# Patient Record
Sex: Male | Born: 1960 | Race: White | Hispanic: No | Marital: Married | Smoking: Never smoker
Health system: Southern US, Community
[De-identification: ages and names within clinical notes are randomized; demographics above are authoritative.]

## PROBLEM LIST (undated history)

## (undated) DIAGNOSIS — M1990 Unspecified osteoarthritis, unspecified site: Secondary | ICD-10-CM

## (undated) DIAGNOSIS — K649 Unspecified hemorrhoids: Secondary | ICD-10-CM

## (undated) DIAGNOSIS — R740 Nonspecific elevation of levels of transaminase and lactic acid dehydrogenase [LDH]: Secondary | ICD-10-CM

## (undated) DIAGNOSIS — R7401 Elevation of levels of liver transaminase levels: Secondary | ICD-10-CM

## (undated) DIAGNOSIS — T7840XA Allergy, unspecified, initial encounter: Secondary | ICD-10-CM

## (undated) DIAGNOSIS — U071 COVID-19: Secondary | ICD-10-CM

## (undated) DIAGNOSIS — Z86010 Personal history of colonic polyps: Secondary | ICD-10-CM

## (undated) DIAGNOSIS — E782 Mixed hyperlipidemia: Secondary | ICD-10-CM

## (undated) DIAGNOSIS — K219 Gastro-esophageal reflux disease without esophagitis: Secondary | ICD-10-CM

## (undated) DIAGNOSIS — I10 Essential (primary) hypertension: Secondary | ICD-10-CM

## (undated) DIAGNOSIS — R7402 Elevation of levels of lactic acid dehydrogenase (LDH): Secondary | ICD-10-CM

## (undated) DIAGNOSIS — F419 Anxiety disorder, unspecified: Secondary | ICD-10-CM

## (undated) HISTORY — DX: Essential (primary) hypertension: I10

## (undated) HISTORY — DX: Personal history of colonic polyps: Z86.010

## (undated) HISTORY — DX: COVID-19: U07.1

## (undated) HISTORY — DX: Elevation of levels of lactic acid dehydrogenase (LDH): R74.02

## (undated) HISTORY — DX: Anxiety disorder, unspecified: F41.9

## (undated) HISTORY — PX: POLYPECTOMY: SHX149

## (undated) HISTORY — PX: COLONOSCOPY: SHX174

## (undated) HISTORY — DX: Gastro-esophageal reflux disease without esophagitis: K21.9

## (undated) HISTORY — DX: Elevation of levels of liver transaminase levels: R74.01

## (undated) HISTORY — DX: Mixed hyperlipidemia: E78.2

## (undated) HISTORY — DX: Unspecified osteoarthritis, unspecified site: M19.90

## (undated) HISTORY — DX: Allergy, unspecified, initial encounter: T78.40XA

## (undated) HISTORY — DX: Unspecified hemorrhoids: K64.9

## (undated) HISTORY — DX: Nonspecific elevation of levels of transaminase and lactic acid dehydrogenase (ldh): R74.0

---
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
## 2005-08-15 ENCOUNTER — Ambulatory Visit: Payer: Self-pay | Admitting: Internal Medicine

## 2005-08-19 ENCOUNTER — Ambulatory Visit: Payer: Self-pay

## 2005-10-03 ENCOUNTER — Ambulatory Visit: Payer: Self-pay | Admitting: Internal Medicine

## 2005-11-23 ENCOUNTER — Ambulatory Visit: Payer: Self-pay | Admitting: Internal Medicine

## 2006-01-03 ENCOUNTER — Ambulatory Visit: Payer: Self-pay | Admitting: Internal Medicine

## 2006-01-10 ENCOUNTER — Ambulatory Visit: Payer: Self-pay | Admitting: Internal Medicine

## 2006-02-14 ENCOUNTER — Ambulatory Visit: Payer: Self-pay | Admitting: Internal Medicine

## 2006-02-21 ENCOUNTER — Ambulatory Visit: Payer: Self-pay | Admitting: Internal Medicine

## 2006-03-28 ENCOUNTER — Ambulatory Visit: Payer: Self-pay | Admitting: Internal Medicine

## 2006-04-04 ENCOUNTER — Ambulatory Visit: Payer: Self-pay | Admitting: Internal Medicine

## 2006-05-10 ENCOUNTER — Ambulatory Visit: Payer: Self-pay | Admitting: Internal Medicine

## 2006-05-16 ENCOUNTER — Ambulatory Visit: Payer: Self-pay | Admitting: Internal Medicine

## 2006-11-28 LAB — HM COLONOSCOPY

## 2007-08-21 ENCOUNTER — Ambulatory Visit: Payer: Self-pay | Admitting: Internal Medicine

## 2007-08-27 ENCOUNTER — Encounter: Payer: Self-pay | Admitting: Internal Medicine

## 2007-08-28 ENCOUNTER — Encounter: Payer: Self-pay | Admitting: Internal Medicine

## 2007-08-28 ENCOUNTER — Ambulatory Visit: Payer: Self-pay | Admitting: Internal Medicine

## 2007-08-28 ENCOUNTER — Ambulatory Visit (HOSPITAL_COMMUNITY): Admission: RE | Admit: 2007-08-28 | Discharge: 2007-08-28 | Payer: Self-pay | Admitting: Internal Medicine

## 2007-09-03 ENCOUNTER — Ambulatory Visit (HOSPITAL_COMMUNITY): Admission: RE | Admit: 2007-09-03 | Discharge: 2007-09-03 | Payer: Self-pay | Admitting: Internal Medicine

## 2007-09-20 ENCOUNTER — Ambulatory Visit: Payer: Self-pay | Admitting: Internal Medicine

## 2007-09-20 ENCOUNTER — Encounter: Payer: Self-pay | Admitting: Internal Medicine

## 2007-12-17 ENCOUNTER — Ambulatory Visit: Payer: Self-pay | Admitting: Internal Medicine

## 2007-12-17 DIAGNOSIS — I10 Essential (primary) hypertension: Secondary | ICD-10-CM | POA: Insufficient documentation

## 2007-12-17 DIAGNOSIS — K219 Gastro-esophageal reflux disease without esophagitis: Secondary | ICD-10-CM | POA: Insufficient documentation

## 2007-12-17 DIAGNOSIS — E1169 Type 2 diabetes mellitus with other specified complication: Secondary | ICD-10-CM | POA: Insufficient documentation

## 2007-12-17 DIAGNOSIS — I152 Hypertension secondary to endocrine disorders: Secondary | ICD-10-CM | POA: Insufficient documentation

## 2007-12-17 DIAGNOSIS — E782 Mixed hyperlipidemia: Secondary | ICD-10-CM

## 2007-12-20 ENCOUNTER — Telehealth: Payer: Self-pay | Admitting: Internal Medicine

## 2007-12-20 LAB — CONVERTED CEMR LAB
AST: 39 units/L — ABNORMAL HIGH (ref 0–37)
Albumin: 4.3 g/dL (ref 3.5–5.2)
Alkaline Phosphatase: 71 units/L (ref 39–117)
BUN: 16 mg/dL (ref 6–23)
CO2: 30 meq/L (ref 19–32)
Creatinine, Ser: 0.9 mg/dL (ref 0.4–1.5)
GFR calc Af Amer: 116 mL/min
GFR calc non Af Amer: 96 mL/min
Glucose, Bld: 78 mg/dL (ref 70–99)
HDL: 32.4 mg/dL — ABNORMAL LOW (ref >39.0)
Total CHOL/HDL Ratio: 6.9
Triglycerides: 222 mg/dL (ref 0–149)

## 2007-12-21 ENCOUNTER — Telehealth: Payer: Self-pay | Admitting: Internal Medicine

## 2008-03-18 ENCOUNTER — Ambulatory Visit: Payer: Self-pay | Admitting: Internal Medicine

## 2008-03-20 ENCOUNTER — Ambulatory Visit: Payer: Self-pay | Admitting: Internal Medicine

## 2008-07-25 ENCOUNTER — Ambulatory Visit: Payer: Self-pay | Admitting: Internal Medicine

## 2008-07-25 LAB — CONVERTED CEMR LAB
ALT: 39 units/L (ref 0–53)
Albumin: 4.4 g/dL (ref 3.5–5.2)
BUN: 17 mg/dL (ref 6–23)
Bilirubin, Direct: 0.1 mg/dL (ref 0.0–0.3)
CO2: 31 meq/L (ref 19–32)
Chloride: 103 meq/L (ref 96–112)
GFR calc Af Amer: 132 mL/min
GFR calc non Af Amer: 109 mL/min
Glucose, Bld: 94 mg/dL (ref 70–99)
Sodium: 142 meq/L (ref 135–145)
Total Bilirubin: 1 mg/dL (ref 0.3–1.2)
Total Protein: 8.1 g/dL (ref 6.0–8.3)
VLDL: 51 mg/dL — ABNORMAL HIGH (ref 0–40)

## 2008-08-01 ENCOUNTER — Ambulatory Visit: Payer: Self-pay | Admitting: Internal Medicine

## 2008-12-17 ENCOUNTER — Ambulatory Visit: Payer: Self-pay | Admitting: Internal Medicine

## 2008-12-17 LAB — CONVERTED CEMR LAB
Albumin: 4.4 g/dL (ref 3.5–5.2)
Alkaline Phosphatase: 77 units/L (ref 39–117)
Cholesterol: 254 mg/dL (ref 0–200)
Direct LDL: 163.9 mg/dL
Total Bilirubin: 1.2 mg/dL (ref 0.3–1.2)
Total CHOL/HDL Ratio: 8.1
VLDL: 65 mg/dL — ABNORMAL HIGH (ref 0–40)

## 2008-12-25 ENCOUNTER — Ambulatory Visit: Payer: Self-pay | Admitting: Internal Medicine

## 2009-04-16 ENCOUNTER — Ambulatory Visit: Payer: Self-pay | Admitting: Internal Medicine

## 2009-04-17 LAB — CONVERTED CEMR LAB
AST: 70 units/L — ABNORMAL HIGH (ref 0–37)
Albumin: 4.4 g/dL (ref 3.5–5.2)
Cholesterol: 206 mg/dL — ABNORMAL HIGH (ref 0–200)
Direct LDL: 136.2 mg/dL
Total Bilirubin: 0.9 mg/dL (ref 0.3–1.2)
Total CHOL/HDL Ratio: 6

## 2009-04-29 ENCOUNTER — Ambulatory Visit: Payer: Self-pay | Admitting: Internal Medicine

## 2009-05-04 ENCOUNTER — Encounter: Admission: RE | Admit: 2009-05-04 | Discharge: 2009-05-04 | Payer: Self-pay | Admitting: Internal Medicine

## 2009-05-20 ENCOUNTER — Ambulatory Visit: Payer: Self-pay | Admitting: Internal Medicine

## 2009-06-24 ENCOUNTER — Ambulatory Visit: Payer: Self-pay | Admitting: Internal Medicine

## 2009-06-24 LAB — CONVERTED CEMR LAB
ALT: 50 units/L (ref 0–53)
AST: 37 units/L (ref 0–37)
Alkaline Phosphatase: 74 units/L (ref 39–117)
Bilirubin, Direct: 0.1 mg/dL (ref 0.0–0.3)
Cholesterol: 198 mg/dL (ref 0–200)
HDL: 31.9 mg/dL — ABNORMAL LOW (ref >39.00)
Total Bilirubin: 0.9 mg/dL (ref 0.3–1.2)
Total Protein: 7.5 g/dL (ref 6.0–8.3)
Triglycerides: 454 mg/dL — ABNORMAL HIGH (ref 0.0–149.0)
VLDL: 90.8 mg/dL — ABNORMAL HIGH (ref 0.0–40.0)

## 2009-06-26 ENCOUNTER — Ambulatory Visit: Payer: Self-pay | Admitting: Internal Medicine

## 2009-12-10 ENCOUNTER — Ambulatory Visit: Payer: Self-pay | Admitting: Internal Medicine

## 2009-12-10 LAB — CONVERTED CEMR LAB
ALT: 93 units/L — ABNORMAL HIGH (ref 0–53)
AST: 74 units/L — ABNORMAL HIGH (ref 0–37)
BUN: 12 mg/dL (ref 6–23)
CO2: 27 meq/L (ref 19–32)
Calcium: 9.1 mg/dL (ref 8.4–10.5)
GFR calc non Af Amer: 108.44 mL/min (ref >60)
HDL: 31.7 mg/dL — ABNORMAL LOW (ref >39.00)
Total Bilirubin: 0.8 mg/dL (ref 0.3–1.2)
Total Protein: 7.9 g/dL (ref 6.0–8.3)
Triglycerides: 847 mg/dL — ABNORMAL HIGH (ref 0.0–149.0)

## 2009-12-22 ENCOUNTER — Ambulatory Visit: Payer: Self-pay | Admitting: Internal Medicine

## 2010-03-18 ENCOUNTER — Observation Stay (HOSPITAL_COMMUNITY): Admission: EM | Admit: 2010-03-18 | Discharge: 2010-03-19 | Payer: Self-pay | Admitting: Emergency Medicine

## 2010-03-23 ENCOUNTER — Ambulatory Visit: Payer: Self-pay | Admitting: Internal Medicine

## 2010-04-06 ENCOUNTER — Ambulatory Visit: Payer: Self-pay | Admitting: Internal Medicine

## 2010-05-28 LAB — HM DIABETES EYE EXAM

## 2010-06-08 ENCOUNTER — Encounter: Payer: Self-pay | Admitting: Internal Medicine

## 2010-07-15 ENCOUNTER — Ambulatory Visit: Payer: Self-pay | Admitting: Internal Medicine

## 2010-07-15 LAB — CONVERTED CEMR LAB
ALT: 22 units/L (ref 0–53)
AST: 23 units/L (ref 0–37)
Alkaline Phosphatase: 73 units/L (ref 39–117)
BUN: 15 mg/dL (ref 6–23)
CO2: 30 meq/L (ref 19–32)
Chloride: 103 meq/L (ref 96–112)
Cholesterol: 174 mg/dL (ref 0–200)
Creatinine, Ser: 0.7 mg/dL (ref 0.4–1.5)
GFR calc non Af Amer: 118.36 mL/min (ref >60)
Glucose, Bld: 71 mg/dL (ref 70–99)
HDL: 33.6 mg/dL — ABNORMAL LOW (ref >39.00)
Hgb A1c MFr Bld: 5.6 % (ref 4.6–6.5)
LDL Cholesterol: 124 mg/dL — ABNORMAL HIGH (ref 0–99)
Potassium: 4 meq/L (ref 3.5–5.1)
Sodium: 140 meq/L (ref 135–145)
Total Bilirubin: 0.9 mg/dL (ref 0.3–1.2)
Total Protein: 7.2 g/dL (ref 6.0–8.3)
VLDL: 16.8 mg/dL (ref 0.0–40.0)

## 2010-07-22 ENCOUNTER — Ambulatory Visit: Payer: Self-pay | Admitting: Internal Medicine

## 2010-07-22 DIAGNOSIS — F418 Other specified anxiety disorders: Secondary | ICD-10-CM | POA: Insufficient documentation

## 2010-08-13 ENCOUNTER — Emergency Department (HOSPITAL_COMMUNITY): Admission: EM | Admit: 2010-08-13 | Discharge: 2010-08-13 | Payer: Self-pay | Admitting: Emergency Medicine

## 2010-11-04 ENCOUNTER — Ambulatory Visit: Payer: Self-pay | Admitting: Internal Medicine

## 2010-11-04 LAB — CONVERTED CEMR LAB
ALT: 29 U/L
AST: 33 U/L
Albumin: 4.7 g/dL
Alkaline Phosphatase: 79 U/L
BUN: 20 mg/dL
Bilirubin, Direct: 0.2 mg/dL
CO2: 31 meq/L
Calcium: 9.9 mg/dL
Chloride: 100 meq/L
Cholesterol: 218 mg/dL — ABNORMAL HIGH
Creatinine, Ser: 0.8 mg/dL
Direct LDL: 150.8 mg/dL
GFR calc non Af Amer: 109.63 mL/min
Glucose, Bld: 94 mg/dL
HDL: 40.3 mg/dL
Hgb A1c MFr Bld: 5.4 %
Potassium: 4.5 meq/L
Sodium: 139 meq/L
Total Bilirubin: 1.2 mg/dL
Total CHOL/HDL Ratio: 5
Total Protein: 8.1 g/dL
Triglycerides: 131 mg/dL
VLDL: 26.2 mg/dL

## 2010-11-11 ENCOUNTER — Ambulatory Visit: Payer: Self-pay | Admitting: Internal Medicine

## 2010-11-11 DIAGNOSIS — E119 Type 2 diabetes mellitus without complications: Secondary | ICD-10-CM | POA: Insufficient documentation

## 2010-12-29 NOTE — Assessment & Plan Note (Signed)
Summary: post hosp check/new DM/dm   Vital Signs:  Patient profile:   50 year old male Weight:      178 pounds Temp:     98.6 degrees F oral Pulse rate:   64 / minute Pulse rhythm:   regular Resp:     12 per minute BP sitting:   116 / 78  (left arm) Cuff size:   regular  Vitals Entered By: Gladis Riffle, RN (March 23, 2010 10:01 AM) CC: hospital follow up, newly dianosed diabetic--CBGs  120-263 at home--discuss CBG frequency and crestor dose Is Patient Diabetic? Yes Did you bring your meter with you today? Yes   CC:  hospital follow up and newly dianosed diabetic--CBGs  120-263 at home--discuss CBG frequency and crestor dose.  History of Present Illness:  Follow-Up Visit      This is a 50 year old man who presents for Follow-up visit.  The patient denies chest pain and palpitations.  Since the last visit the patient notes a recent hospitilization.  The patient reports taking meds as prescribed.  When questioned about possible medication side effects, the patient notes none.  New dx of DM---see DC summary (prior to admission was noncompliant with meds and diet) polyuria and polydipsia have resolved home cbgs 130-230 tolerating meds (metformin) Communicated with Dr. Janice Norrie  Preventive Screening-Counseling & Management  Alcohol-Tobacco     Smoking Status: never  Allergies (verified): No Known Drug Allergies  Past History:  Past Surgical History: Last updated: 12/17/2007 Appendectomy-age 50  Family History: Last updated: 03/23/2010 mother--COPD father---CHF deceased---40s (?CAD) no fhx DM  Social History: Last updated: 12/17/2007 Married Never Smoked Regular exercise-no  Risk Factors: Exercise: no (12/17/2007)  Risk Factors: Smoking Status: never (03/23/2010)  Past Medical History: Current Problems:  TRANSAMINASES, SERUM, ELEVATED (ICD-790.4) HYPERLIPIDEMIA (ICD-272.2) HYPERTENSION, ESSENTIAL NOS (ICD-401.9) GERD HEMORRHOIDS   Diabetes mellitus, type  II  Family History: mother--COPD father---CHF deceased---40s (?CAD) no fhx DM  Physical Exam  General:  alert and well-developed.   Head:  normocephalic and atraumatic.   Eyes:  pupils equal and pupils round.   Ears:  R ear normal and L ear normal.   Neck:  No deformities, masses, or tenderness noted. Lungs:  normal respiratory effort and no intercostal retractions.   Heart:  normal rate and regular rhythm.   Abdomen:  soft and non-tender.   Msk:  No deformity or scoliosis noted of thoracic or lumbar spine.   Pulses:  R radial normal and L radial normal.   Neurologic:  cranial nerves II-XII intact and gait normal.     Impression & Recommendations:  Problem # 1:  DIABETES MELLITUS, TYPE II (ICD-250.00) Assessment New discussed at length CBGS are better but not good enough side effects of meds discussed needs to exercise every day (he is active at work)  advised aggressive weight loss daily exercise.  His updated medication list for this problem includes:    Lisinopril 20 Mg Tabs (Lisinopril) .Marland Kitchen... Take 1 tablet by mouth once a day    Metformin Hcl 500 Mg Tabs (Metformin hcl) .Marland Kitchen... Take 1 tablet by mouth two times a day  Complete Medication List: 1)  Aciphex 20 Mg Tbec (Rabeprazole sodium) .... Take 1 tablet by mouth once a day before a meal 2)  Coq-10 150 Mg Caps (Coenzyme q10) .... Once daily 3)  Crestor 20 Mg Tabs (Rosuvastatin calcium) .Marland Kitchen.. 1 tablet by mouth daily 4)  Vitamin B-12 500 Mcg Tabs (Cyanocobalamin) .... Once daily 5)  Ra Fish  Oil 1000 Mg Caps (Omega-3 fatty acids) .... 3 by mouth once daily 6)  Lisinopril 20 Mg Tabs (Lisinopril) .... Take 1 tablet by mouth once a day 7)  Metformin Hcl 500 Mg Tabs (Metformin hcl) .... Take 1 tablet by mouth two times a day 8)  Accu-chek Aviva Strp (Glucose blood) .... Use three times a day as directed  Patient Instructions: 1)  keep appt may 10 (no need for blood work)

## 2010-12-29 NOTE — Assessment & Plan Note (Signed)
Summary: 3 MONTH FUP//CCM   Vital Signs:  Patient profile:   50 year old male Weight:      147 pounds BMI:     23.81 Temp:     98.5 degrees F oral Pulse rate:   60 / minute Pulse rhythm:   regular Resp:     12 per minute BP sitting:   138 / 80  (left arm) Cuff size:   regular  Vitals Entered By: Gladis Riffle, RN (July 22, 2010 9:52 AM) CC: 3 month rov,pt stopped metformin and is trying to control sugar himself labs done--CBGs 7 day 90, 14 day 92, 30 day 99 at home for averages Is Patient Diabetic? Yes Did you bring your meter with you today? Yes   CC:  3 month rov, pt stopped metformin and is trying to control sugar himself labs done--CBGs 7 day 90, 14 day 92, and 30 day 99 at home for averages.  History of Present Illness:  Follow-Up Visit      This is a 50 year old man who presents for Follow-up visit.  The patient denies chest pain and palpitations.  Since the last visit the patient notes no new problems or concerns.  The patient reports taking meds as prescribed, monitoring blood sugars, dietary compliance, and exercising on regular basis.  When questioned about possible medication side effects, the patient notes none.    All other systems reviewed and were negative   Preventive Screening-Counseling & Management  Alcohol-Tobacco     Smoking Status: never  Current Problems (verified): 1)  Diabetes Mellitus, Type II  (ICD-250.00) 2)  Carotid Artery Disease  (ICD-433.10) 3)  Uns Advrs Eff Uns Rx Medicinal&biological Sbstnc  (ICD-995.20) 4)  Gerd  (ICD-530.1) 5)  Transaminases, Serum, Elevated  (ICD-790.4) 6)  Hyperlipidemia  (ICD-272.2) 7)  Hypertension, Essential Nos  (ICD-401.9)  Current Medications (verified): 1)  Aciphex 20 Mg Tbec (Rabeprazole Sodium) .... Take 1 Tablet By Mouth Once A Day Before A Meal 2)  Coq-10 150 Mg Caps (Coenzyme Q10) .... Once Daily 3)  Crestor 20 Mg Tabs (Rosuvastatin Calcium) .Marland Kitchen.. 1 Tablet By Mouth Daily 4)  Vitamin B-12 500 Mcg Tabs  (Cyanocobalamin) .... Once Daily 5)  Ra Fish Oil 1000 Mg Caps (Omega-3 Fatty Acids) .... 3 By Mouth Once Daily 6)  Lisinopril 20 Mg Tabs (Lisinopril) .... Take 1 Tablet By Mouth Once A Day 7)  Accu-Chek Aviva  Strp (Glucose Blood) .... Use Three Times A Day As Directed  Allergies (verified): No Known Drug Allergies  Past History:  Past Medical History: Last updated: 03/23/2010 Current Problems:  TRANSAMINASES, SERUM, ELEVATED (ICD-790.4) HYPERLIPIDEMIA (ICD-272.2) HYPERTENSION, ESSENTIAL NOS (ICD-401.9) GERD HEMORRHOIDS   Diabetes mellitus, type II  Past Surgical History: Last updated: 12/17/2007 Appendectomy-age 89  Family History: Last updated: 03/23/2010 mother--COPD father---CHF deceased---40s (?CAD) no fhx DM  Social History: Last updated: 12/17/2007 Married Never Smoked Regular exercise-no  Risk Factors: Exercise: no (12/17/2007)  Risk Factors: Smoking Status: never (07/22/2010)  Physical Exam  General:  alert and well-developed.   Head:  normocephalic and atraumatic.   Eyes:  pupils equal and pupils round.   Abdomen:  soft and non-tender.   Msk:  No deformity or scoliosis noted of thoracic or lumbar spine.   Pulses:  R radial normal and L radial normal.   Neurologic:  cranial nerves II-XII intact and gait normal.   Skin:  turgor normal and color normal.   Psych:  normally interactive and good eye contact.  Diabetes Management Exam:    Eye Exam:       Eye Exam done elsewhere          Date: 05/28/2010          Results: normal          Done by: ophthal   Impression & Recommendations:  Problem # 1:  DIABETES MELLITUS, TYPE II (ICD-250.00) essentially he has cured himself with weight loss and exercise The following medications were removed from the medication list:    Lisinopril 20 Mg Tabs (Lisinopril) .Marland Kitchen... Take 1 tablet by mouth once a day    Metformin Hcl 500 Mg Tabs (Metformin hcl) .Marland Kitchen... Take 1 tablet by mouth once a day  Problem # 2:   HYPERLIPIDEMIA (ICD-272.2) adequately controlled continue current medications  His updated medication list for this problem includes:    Crestor 20 Mg Tabs (Rosuvastatin calcium) .Marland Kitchen... 1 tablet by mouth daily (taking every other day )  Labs Reviewed: SGOT: 23 (07/15/2010)   SGPT: 22 (07/15/2010)   HDL:33.60 (07/15/2010), 31.70 (12/10/2009)  LDL:124 (07/15/2010), DEL (12/17/2008)  Chol:174 (07/15/2010), 209 (12/10/2009)  Trig:84.0 (07/15/2010), 847.0 (12/10/2009)  Problem # 3:  HYPERTENSION, ESSENTIAL NOS (ICD-401.9) he has run out of medications he will monitor BP at home and call for bp >135/85 The following medications were removed from the medication list:    Lisinopril 20 Mg Tabs (Lisinopril) .Marland Kitchen... Take 1 tablet by mouth once a day His updated medication list for this problem includes:    Propranolol Hcl 10 Mg Tabs (Propranolol hcl) .Marland Kitchen... Take 1 tablet by mouth once a day as needed situational anxiety  BP today: 138/80 Prior BP: 122/78 (04/06/2010)  Labs Reviewed: K+: 4.0 (07/15/2010) Creat: : 0.7 (07/15/2010)   Chol: 174 (07/15/2010)   HDL: 33.60 (07/15/2010)   LDL: 124 (07/15/2010)   TG: 84.0 (07/15/2010)  Problem # 4:  GERD (ICD-530.1) he is taking aciphex rarely His updated medication list for this problem includes:    Aciphex 20 Mg Tbec (Rabeprazole sodium) .Marland Kitchen... Take 1 tablet by mouth once a day before a meal  Problem # 5:  ANXIETY, SITUATIONAL (ICD-308.3) trial propranolo prn  Complete Medication List: 1)  Aciphex 20 Mg Tbec (Rabeprazole sodium) .... Take 1 tablet by mouth once a day before a meal 2)  Coq-10 150 Mg Caps (Coenzyme q10) .... Once daily 3)  Crestor 20 Mg Tabs (Rosuvastatin calcium) .Marland Kitchen.. 1 tablet by mouth daily 4)  Vitamin B-12 500 Mcg Tabs (Cyanocobalamin) .... Once daily 5)  Ra Fish Oil 1000 Mg Caps (Omega-3 fatty acids) .... 3 by mouth once daily 6)  Accu-chek Aviva Strp (Glucose blood) .... Use three times a day as directed 7)  Propranolol Hcl 10 Mg  Tabs (Propranolol hcl) .... Take 1 tablet by mouth once a day as needed situational anxiety  Patient Instructions: 1)  Please schedule a follow-up appointment in 4 months. 2)  labs one week prior to visit 3)  lipids---272.4 4)  lfts-995.2 5)  bmet-995.2 6)  A1C-250.02 7)     Prescriptions: PROPRANOLOL HCL 10 MG TABS (PROPRANOLOL HCL) Take 1 tablet by mouth once a day as needed situational anxiety  #30 x 1   Entered and Authorized by:   Birdie Sons MD   Signed by:   Birdie Sons MD on 07/22/2010   Method used:   Electronically to        [facility] [address] [phone]* (retail)       [address]  [place], [place]  [address]       Ph: [phone]       Fax: [phone]   RxID:   [id number]

## 2010-12-29 NOTE — Assessment & Plan Note (Signed)
Summary: 3 MONTH FOLLOW UP/CJR---PT [facility] // RS   Vital Signs:  Patient profile:   50 year old male Weight:      172 pounds BMI:     27.86 Temp:     98.6 degrees F oral Pulse rate:   64 / minute Pulse rhythm:   regular Resp:     12 per minute BP sitting:   122 / 78  (left arm) Cuff size:   regular  Vitals Entered By: Gladis Riffle, RN (Apr 06, 2010 10:57 AM)  Nutrition Counseling: Patient's BMI is greater than 25 and therefore counseled on weight management options. CC: 3 month rov, metformin once a day due to diarrhea, some resolved with less--CBGs 96-133 at home--average 115 for 7 day Is Patient Diabetic? Yes Did you bring your meter with you today? Yes   CC:  3 month rov, metformin once a day due to diarrhea, and some resolved with less--CBGs 96-133 at home--average 115 for 7 day.  History of Present Illness:  Follow-Up Visit      This is a 50 year old man who presents for Follow-up visit.  The patient denies chest pain and palpitations.  Since the last visit the patient notes no new problems or concerns.  The patient reports taking meds as prescribed and monitoring blood sugars.  When questioned about possible medication side effects, the patient notes none.    All other systems reviewed and were negative   Preventive Screening-Counseling & Management  Alcohol-Tobacco     Smoking Status: never  Current Problems (verified): 1)  Diabetes Mellitus, Type II  (ICD-250.00) 2)  Carotid Artery Disease  (ICD-433.10) 3)  Uns Advrs Eff Uns Rx Medicinal&biological Sbstnc  (ICD-995.20) 4)  Gerd  (ICD-530.1) 5)  Transaminases, Serum, Elevated  (ICD-790.4) 6)  Hyperlipidemia  (ICD-272.2) 7)  Hypertension, Essential Nos  (ICD-401.9)  Current Medications (verified): 1)  Aciphex 20 Mg Tbec (Rabeprazole Sodium) .... Take 1 Tablet By Mouth Once A Day Before A Meal 2)  Coq-10 150 Mg Caps (Coenzyme Q10) .... Once Daily 3)  Crestor 20 Mg Tabs (Rosuvastatin Calcium) .Marland Kitchen.. 1 Tablet By Mouth  Daily 4)  Vitamin B-12 500 Mcg Tabs (Cyanocobalamin) .... Once Daily 5)  Ra Fish Oil 1000 Mg Caps (Omega-3 Fatty Acids) .... 3 By Mouth Once Daily 6)  Lisinopril 20 Mg Tabs (Lisinopril) .... Take 1 Tablet By Mouth Once A Day 7)  Metformin Hcl 500 Mg Tabs (Metformin Hcl) .... Take 1 Tablet By Mouth Once A Day 8)  Accu-Chek Aviva  Strp (Glucose Blood) .... Use Three Times A Day As Directed  Allergies (verified): No Known Drug Allergies  Past History:  Past Medical History: Last updated: 03/23/2010 Current Problems:  TRANSAMINASES, SERUM, ELEVATED (ICD-790.4) HYPERLIPIDEMIA (ICD-272.2) HYPERTENSION, ESSENTIAL NOS (ICD-401.9) GERD HEMORRHOIDS   Diabetes mellitus, type II  Past Surgical History: Last updated: 12/17/2007 Appendectomy-age 7  Family History: Last updated: 03/23/2010 mother--COPD father---CHF deceased---40s (?CAD) no fhx DM  Social History: Last updated: 12/17/2007 Married Never Smoked Regular exercise-no  Risk Factors: Exercise: no (12/17/2007)  Risk Factors: Smoking Status: never (04/06/2010)  Physical Exam  General:  alert and well-developed.   Head:  normocephalic and atraumatic.     Impression & Recommendations:  Problem # 1:  DIABETES MELLITUS, TYPE II (ICD-250.00) has lost some weight tolerating meds---no side effects following a good diet.  His updated medication list for this problem includes:    Lisinopril 20 Mg Tabs (Lisinopril) .Marland Kitchen... Take 1 tablet by mouth once a day  Metformin Hcl 500 Mg Tabs (Metformin hcl) .Marland Kitchen... Take 1 tablet by mouth once a day  Problem # 2:  HYPERLIPIDEMIA (ICD-272.2) monitor 3 months His updated medication list for this problem includes:    Crestor 20 Mg Tabs (Rosuvastatin calcium) .Marland Kitchen... 1 tablet by mouth daily  Labs Reviewed: SGOT: 74 (12/10/2009)   SGPT: 93 (12/10/2009)   HDL:31.70 (12/10/2009), 31.90 (06/24/2009)  LDL:DEL (12/17/2008), DEL (07/25/2008)  Chol:209 (12/10/2009), 198 (06/24/2009)   Trig:847.0 (12/10/2009), 454.0 (06/24/2009)  Problem # 3:  HYPERTENSION, ESSENTIAL NOS (ICD-401.9)  His updated medication list for this problem includes:    Lisinopril 20 Mg Tabs (Lisinopril) .Marland Kitchen... Take 1 tablet by mouth once a day  BP today: 122/78 Prior BP: 116/78 (03/23/2010)  Labs Reviewed: K+: 3.8 (12/10/2009) Creat: : 0.8 (12/10/2009)   Chol: 209 (12/10/2009)   HDL: 31.70 (12/10/2009)   LDL: DEL (12/17/2008)   TG: 847.0 (12/10/2009)  Complete Medication List: 1)  Aciphex 20 Mg Tbec (Rabeprazole sodium) .... Take 1 tablet by mouth once a day before a meal 2)  Coq-10 150 Mg Caps (Coenzyme q10) .... Once daily 3)  Crestor 20 Mg Tabs (Rosuvastatin calcium) .Marland Kitchen.. 1 tablet by mouth daily 4)  Vitamin B-12 500 Mcg Tabs (Cyanocobalamin) .... Once daily 5)  Ra Fish Oil 1000 Mg Caps (Omega-3 fatty acids) .... 3 by mouth once daily 6)  Lisinopril 20 Mg Tabs (Lisinopril) .... Take 1 tablet by mouth once a day 7)  Metformin Hcl 500 Mg Tabs (Metformin hcl) .... Take 1 tablet by mouth once a day 8)  Accu-chek Aviva Strp (Glucose blood) .... Use three times a day as directed  Patient Instructions: 1)  Please schedule a follow-up appointment in 3 months. 2)  labs one week prior to visit 3)  lipids---272.4 4)  lfts-995.2 5)  bmet-995.2 6)  A1C-250.02 7)     8)  It is important that you exercise regularly at least 20 minutes 5 times a week. If you develop chest pain, have severe difficulty breathing, or feel very tired , stop exercising immediately and seek medical attention. 9)  You need to lose weight. Consider a lower calorie diet and regular exercise.  10)  Check your blood sugars regularly. If your readings are usually above : or below 70 you should contact our office.

## 2010-12-29 NOTE — Letter (Signed)
Summary: Eye Exam-[facility]  Eye Exam-[facility]   Imported By: Maryln Gottron 06/18/2010 12:12:55  _____________________________________________________________________  External Attachment:    Type:   Image     Comment:   External Document

## 2010-12-29 NOTE — Assessment & Plan Note (Signed)
Summary: 6 month rov/njr   Vital Signs:  Patient profile:   50 year old male Weight:      191 pounds Temp:     97.9 degrees F Pulse rate:   80 / minute Resp:     12 per minute BP sitting:   142 / 86  (left arm)  Vitals Entered By: Gladis Riffle, RN (December 22, 2009 8:50 AM)   History of Present Illness:  Follow-Up Visit      This is a 50 year old man who presents for Follow-up visit.  The patient denies chest pain, palpitations, dizziness, syncope, edema, SOB, DOE, PND, and orthopnea.  Since the last visit the patient notes no new problems or concerns.  The patient reports taking meds as prescribed.  When questioned about possible medication side effects, the patient notes none.  he was previously taking fishoil---no longer.   All other systems reviewed and were negative   Preventive Screening-Counseling & Management  Alcohol-Tobacco     Smoking Status: never  Current Problems (verified): 1)  Carotid Artery Disease (ULTRASOUND 2010 50% STENOSIS)  (ICD-433.10) 2)  Uns Advrs Eff Uns Rx Medicinal&biological Sbstnc  (ICD-995.20) 3)  Gerd  (ICD-530.1) 4)  Transaminases, Serum, Elevated  (ICD-790.4) 5)  Hyperlipidemia  (ICD-272.2) 6)  Hypertension, Essential Nos  (ICD-401.9)  Allergies (verified): No Known Drug Allergies  Comments:  Nurse/Medical Assistant: 6 month rov, labs done  The patient's medications and allergies were reviewed with the patient and were updated in the Medication and Allergy Lists. Gladis Riffle, RN (December 22, 2009 8:51 AM)  Past History:  Past Medical History: Last updated: 10/09/2008 Current Problems:  TRANSAMINASES, SERUM, ELEVATED (ICD-790.4) HYPERLIPIDEMIA (ICD-272.2) HYPERTENSION, ESSENTIAL NOS (ICD-401.9) GERD HEMORRHOIDS    Past Surgical History: Last updated: 12/17/2007 Appendectomy-age 50  Family History: Last updated: 08/01/2008 mother--COPD father---CHF deceased---40s (?CAD)  Social History: Last updated:  12/17/2007 Married Never Smoked Regular exercise-no  Risk Factors: Exercise: no (12/17/2007)  Risk Factors: Smoking Status: never (12/22/2009)  Physical Exam  General:  Well-developed,well-nourished,in no acute distress; alert,appropriate and cooperative throughout examination overweight Head:  normocephalic and atraumatic.   Eyes:  pupils equal and pupils round.   Ears:  R ear normal and L ear normal.   Neck:  No deformities, masses, or tenderness noted. Chest Wall:  No deformities, masses, tenderness or gynecomastia noted. Lungs:  Normal respiratory effort, chest expands symmetrically. Lungs are clear to auscultation, no crackles or wheezes. Abdomen:  Bowel sounds positive,abdomen soft and non-tender without masses, organomegaly or hernias noted. overweight Msk:  No deformity or scoliosis noted of thoracic or lumbar spine.   Pulses:  R radial normal and L radial normal.   Neurologic:  cranial nerves II-XII intact and gait normal.     Impression & Recommendations:  Problem # 1:  CAROTID ARTERY DISEASE (ULTRASOUND 2010 50% STENOSIS) (ICD-433.10) needs better control of lipids  Problem # 2:  HYPERLIPIDEMIA (ICD-272.2) Assessment: Deteriorated increase crestor add fish oil His updated medication list for this problem includes:    Crestor 20 Mg Tabs (Rosuvastatin calcium) .Marland Kitchen... 1 tablet by mouth daily  Labs Reviewed: SGOT: 74 (12/10/2009)   SGPT: 93 (12/10/2009)   HDL:31.70 (12/10/2009), 31.90 (06/24/2009)  LDL:DEL (12/17/2008), DEL (07/25/2008)  Chol:209 (12/10/2009), 198 (06/24/2009)  Trig:847.0 (12/10/2009), 454.0 (06/24/2009)  Problem # 3:  HYPERTENSION, ESSENTIAL NOS (ICD-401.9) ? control--he will monitor at home His updated medication list for this problem includes:    Lisinopril-hydrochlorothiazide 20-12.5 Mg Tabs (Lisinopril-hydrochlorothiazide) .Marland Kitchen... Take 1 tablet by mouth once  a day  BP today: 142/86 Prior BP: 130/84 (06/26/2009)  Labs Reviewed: K+: 3.8  (12/10/2009) Creat: : 0.8 (12/10/2009)   Chol: 209 (12/10/2009)   HDL: 31.70 (12/10/2009)   LDL: DEL (12/17/2008)   TG: 847.0 (12/10/2009)  Complete Medication List: 1)  Lisinopril-hydrochlorothiazide 20-12.5 Mg Tabs (Lisinopril-hydrochlorothiazide) .... Take 1 tablet by mouth once a day 2)  Aciphex 20 Mg Tbec (Rabeprazole sodium) .... One by mouth 30 mins before breakfast and evening meal 3)  Coq-10 150 Mg Caps (Coenzyme q10) .... Once daily 4)  Crestor 20 Mg Tabs (Rosuvastatin calcium) .Marland Kitchen.. 1 tablet by mouth daily 5)  Vitamin B-12 500 Mcg Tabs (Cyanocobalamin) .... Once daily 6)  Ra Fish Oil 1000 Mg Caps (Omega-3 fatty acids) .... 3 by mouth once daily  Patient Instructions: 1)  Please schedule a follow-up appointment in 3 months. 2)  lipids 272.4 3)  liver 995.2 Prescriptions: CRESTOR 20 MG TABS (ROSUVASTATIN CALCIUM) 1 tablet by mouth daily  #90 x 3   Entered and Authorized by:   Birdie Sons MD   Signed by:   Birdie Sons MD on 12/22/2009   Method used:   Electronically to        [facility] [address] [phone]* (retail)       [address] [place], [place]  [address]       Ph: [phone]       Fax: [phone]   RxID:   [id number]    Preventive Care Screening  Last Tetanus Booster:    Date:  11/28/2006    Results:  Historical

## 2010-12-30 NOTE — Procedures (Signed)
Summary: EGD & Colonoscopy / [facility]  EGD & Colonoscopy / [facility]   Imported By: Lennie Odor 11/16/2010 14:26:40  _____________________________________________________________________  External Attachment:    Type:   Image     Comment:   External Document

## 2010-12-30 NOTE — Assessment & Plan Note (Signed)
Summary: 4 month rov/njr 8.15am/njr   Vital Signs:  Patient profile:   50 year old male Weight:      148 pounds Temp:     98.3 degrees F oral Pulse rate:   76 / minute BP sitting:   152 / 94  (left arm) Cuff size:   regular  Vitals Entered By: Alfred Levins, CMA (November 11, 2010 8:09 AM) CC: f/u on diabetes and weight loss   CC:  f/u on diabetes and weight loss.  History of Present Illness:  Follow-Up Visit      This is a 50 year old man who presents for Follow-up visit.  The patient denies chest pain and palpitations.  Since the last visit the patient notes no new problems or concerns.  The patient reports taking meds as prescribed, monitoring BP, and monitoring blood sugars.  When questioned about possible medication side effects, the patient notes none.  Home bps 110s/70s, CBGs 90s  All other systems reviewed and were negative   Current Problems (verified): 1)  Anxiety, Situational  (ICD-308.3) 2)  Diabetes Mellitus, Type II  (ICD-250.00) 3)  Carotid Artery Disease  (ICD-433.10) 4)  Gerd  (ICD-530.1) 5)  Hyperlipidemia  (ICD-272.2) 6)  Hypertension, Essential Nos  (ICD-401.9)  Current Medications (verified): 1)  Coq-10 150 Mg Caps (Coenzyme Q10) .... Once Daily 2)  Vitamin B-12 500 Mcg Tabs (Cyanocobalamin) .... Once Daily 3)  Ra Fish Oil 1000 Mg Caps (Omega-3 Fatty Acids) .... 3 By Mouth Once Daily 4)  Accu-Chek Aviva  Strp (Glucose Blood) .... Use Three Times A Day As Directed 5)  Propranolol Hcl 10 Mg Tabs (Propranolol Hcl) .... Take 1 Tablet By Mouth Once A Day As Needed Situational Anxiety  Allergies (verified): No Known Drug Allergies  Past History:  Past Surgical History: Last updated: 12/17/2007 Appendectomy-age 50  Family History: Last updated: 03/23/2010 mother--COPD father---CHF deceased---40s (?CAD) no fhx DM  Social History: Last updated: 12/17/2007 Married Never Smoked Regular exercise-no  Risk Factors: Exercise: no (12/17/2007)  Risk  Factors: Smoking Status: never (07/22/2010)  Past Medical History: Current Problems:  TRANSAMINASES, SERUM, ELEVATED (ICD-790.4) HYPERLIPIDEMIA (ICD-272.2) HYPERTENSION, ESSENTIAL NOS (ICD-401.9)   Diabetes mellitus, type II  Physical Exam  General:  well-developed well-nourished male in no acute distress. HEENT exam atraumatic, normocephalic symmetric her muscles are intact. Neck is supple. Jugular venous pressure normal. Chest clear auscultation cardiac exam S1-S2 are regular. Extremities no edema. Neurologic exam is alert with normal gait.   Impression & Recommendations:  Problem # 1:  DIABETES MELLITUS, TYPE II (ICD-250.00) this problem has essentially resolved due to patient's lifestyle changes. He's lost a dramatic amount of weight. His A1c is normal. I'll see him in 6 months for physical. Labs Reviewed: Creat: 0.8 (11/04/2010)     Last Eye Exam: normal (05/28/2010) Reviewed HgBA1c results: 5.4 (11/04/2010)  5.6 (07/15/2010)  Problem # 2:  HYPERLIPIDEMIA (ICD-272.2) self dcd crestor The following medications were removed from the medication list:    Crestor 20 Mg Tabs (Rosuvastatin calcium) .Marland Kitchen... 1 tablet by mouth daily  Labs Reviewed: SGOT: 33 (11/04/2010)   SGPT: 29 (11/04/2010)   HDL:40.30 (11/04/2010), 33.60 (07/15/2010)  LDL:124 (07/15/2010), DEL (12/17/2008)  Chol:218 (11/04/2010), 174 (07/15/2010)  Trig:131.0 (11/04/2010), 84.0 (07/15/2010)  Problem # 3:  HYPERTENSION, ESSENTIAL NOS (ICD-401.9) home bps well controlled he uses propranolol as needed for anxiety His updated medication list for this problem includes:    Propranolol Hcl 10 Mg Tabs (Propranolol hcl) .Marland Kitchen... Take 1 tablet by mouth once  a day as needed situational anxiety  Complete Medication List: 1)  Coq-10 150 Mg Caps (Coenzyme q10) .... Once daily 2)  Vitamin B-12 500 Mcg Tabs (Cyanocobalamin) .... Once daily 3)  Ra Fish Oil 1000 Mg Caps (Omega-3 fatty acids) .... 3 by mouth once daily 4)   Accu-chek Aviva Strp (Glucose blood) .... Use three times a day as directed 5)  Propranolol Hcl 10 Mg Tabs (Propranolol hcl) .... Take 1 tablet by mouth once a day as needed situational anxiety  Patient Instructions: 1)  Please schedule a follow-up appointment in 6 months. 2)  cpx with A1C   Orders Added: 1)  Est. Patient Level IV [16109]

## 2011-02-15 LAB — BASIC METABOLIC PANEL
BUN: 21 mg/dL (ref 6–23)
CO2: 24 mEq/L (ref 19–32)
CO2: 24 mEq/L (ref 19–32)
CO2: 27 mEq/L (ref 19–32)
Calcium: 8.4 mg/dL (ref 8.4–10.5)
Calcium: 9.6 mg/dL (ref 8.4–10.5)
Chloride: 101 mEq/L (ref 96–112)
Chloride: 95 mEq/L — ABNORMAL LOW (ref 96–112)
Creatinine, Ser: 0.68 mg/dL (ref 0.4–1.5)
Creatinine, Ser: 0.88 mg/dL (ref 0.4–1.5)
Glucose, Bld: 188 mg/dL — ABNORMAL HIGH (ref 70–99)
Glucose, Bld: 413 mg/dL — ABNORMAL HIGH (ref 70–99)
Potassium: 3.4 mEq/L — ABNORMAL LOW (ref 3.5–5.1)
Potassium: 3.5 mEq/L (ref 3.5–5.1)
Sodium: 124 mEq/L — ABNORMAL LOW (ref 135–145)
Sodium: 128 mEq/L — ABNORMAL LOW (ref 135–145)
Sodium: 133 mEq/L — ABNORMAL LOW (ref 135–145)

## 2011-02-15 LAB — GLUCOSE, CAPILLARY
Glucose-Capillary: 183 mg/dL — ABNORMAL HIGH (ref 70–99)
Glucose-Capillary: 236 mg/dL — ABNORMAL HIGH (ref 70–99)
Glucose-Capillary: 237 mg/dL — ABNORMAL HIGH (ref 70–99)
Glucose-Capillary: 262 mg/dL — ABNORMAL HIGH (ref 70–99)
Glucose-Capillary: 262 mg/dL — ABNORMAL HIGH (ref 70–99)
Glucose-Capillary: 280 mg/dL — ABNORMAL HIGH (ref 70–99)

## 2011-02-15 LAB — DIFFERENTIAL
Basophils Absolute: 0 10*3/uL (ref 0.0–0.1)
Basophils Relative: 0 % (ref 0–1)
Eosinophils Absolute: 0.1 10*3/uL (ref 0.0–0.7)
Eosinophils Absolute: 0.3 10*3/uL (ref 0.0–0.7)
Eosinophils Relative: 1 % (ref 0–5)
Eosinophils Relative: 4 % (ref 0–5)
Lymphocytes Relative: 19 % (ref 12–46)
Lymphs Abs: 2 10*3/uL (ref 0.7–4.0)
Monocytes Absolute: 0.7 10*3/uL (ref 0.1–1.0)
Monocytes Relative: 7 % (ref 3–12)
Monocytes Relative: 9 % (ref 3–12)

## 2011-02-15 LAB — CBC
HCT: 35.3 % — ABNORMAL LOW (ref 39.0–52.0)
Hemoglobin: 12.9 g/dL — ABNORMAL LOW (ref 13.0–17.0)
Hemoglobin: 14.2 g/dL (ref 13.0–17.0)
MCHC: 36.6 g/dL — ABNORMAL HIGH (ref 30.0–36.0)
MCHC: 36.8 g/dL — ABNORMAL HIGH (ref 30.0–36.0)
MCV: 83.9 fL (ref 78.0–100.0)
RBC: 4.21 MIL/uL — ABNORMAL LOW (ref 4.22–5.81)
RBC: 4.61 MIL/uL (ref 4.22–5.81)
RDW: 12.1 % (ref 11.5–15.5)

## 2011-02-15 LAB — URINALYSIS, ROUTINE W REFLEX MICROSCOPIC
Bilirubin Urine: NEGATIVE
Glucose, UA: >1000 mg/dL — AB
Leukocytes, UA: NEGATIVE
Nitrite: NEGATIVE
Urobilinogen, UA: 0.2 mg/dL (ref 0.0–1.0)

## 2011-02-15 LAB — URINE MICROSCOPIC-ADD ON

## 2011-02-15 LAB — LIPID PANEL
Cholesterol: 244 mg/dL — ABNORMAL HIGH (ref 0–200)
LDL Cholesterol: UNDETERMINED mg/dL (ref 0–99)
Triglycerides: 1743 mg/dL — ABNORMAL HIGH (ref <150)
VLDL: UNDETERMINED mg/dL (ref 0–40)

## 2011-02-15 LAB — KETONES, QUALITATIVE: Acetone, Bld: NEGATIVE

## 2011-04-12 NOTE — Assessment & Plan Note (Signed)
NAMEMarland Kitchen  MORLEY, GAUMER                 CHART#:  [id number]   DATE:  09/20/2007                       DOB:  1961/01/12   CHIEF COMPLAINT:  Follow-up colonoscopy and EGD.   SUBJECTIVE:  The patient is a 50 year old Caucasian male.  He had an EGD  and colonoscopy for intermittent dyspepsia, alternating constipation and  diarrhea, and intermittent hematochezia, by Dr. Jena Gauss.  He was found to  have patulous EG junction, noncritical Schatzki's ring, underlying  distal esophageal erosion consistent with mild reflux esophagitis, and a  small sliding hiatal hernia.  On colonoscopy he was found to have  friable anal canal and hemorrhoids.  He was given a 10-day course of  Anusol HC and began Aciphex 20 mg daily.  Since that time, he is doing  very well.  He has rare breakthrough symptoms of heartburn and  indigestion.  He denies any rectal bleeding or melena.  He has increased  high residue diet and this does seem to help.   While at our office, he was found to have hepatomegaly.  LFT's were  obtained which showed an AST of 95, ALT of 137, total protein 8.7, and  otherwise normal CMP.  He had a normal CBC, and negative PSA and was  found to have hyperlipidemia.  He was referred to his primary care  physician regarding his hyperlipidemia and he was set up for further  labs and an abdominal ultrasound.  Abdominal ultrasound showed probable  fatty infiltration of the liver, otherwise normal examination.   Laboratory studies showed an elevated ferritin felt to be acute phase  reactant.  When repeat on September 18, 2007, it was normal.  LFT's showed  an AST of 89 and ALT of 120.  He had a negative hepatitis B surface  antigen, C antibody, and normal iron studies.  TIBC was slightly  elevated at 447.   CURRENT MEDICATIONS:  See the list from September 20, 2007.   ALLERGIES:  No known drug allergies.   PHYSICAL EXAMINATION:  VITAL SIGNS:  Weight 182 pounds, height 66  inches, temperature 98 degrees,  blood pressure 120/82, and pulse 80.  GENERAL:  The patient is an obese Caucasian male who is alert, pleasant,  and cooperative, and in no acute distress.  HEENT:  Clear, anicteric.  Conjunctivae pink.  Oropharynx pink and moist  without any lesions.  NECK:  Supple without any mass or thyromegaly.  CHEST:  Heart regular rate and rhythm with normal S1 and S2.  ABDOMEN:  Protuberant with positive bowel sounds x4.  No bruits  auscultated.  Liver was palpable one to two fingerbreadths below the  right costal margin.  Unable to palpate splenomegaly.  There was no  rebound tenderness or guarding.  EXTREMITIES:  Without clubbing or edema bilaterally.   ASSESSMENT:  The patient is a 50 year old Caucasian male with erosive  reflux esophagitis that responded well to PPI.   Hemorrhoidal disease:  Resolved.   Nonalcoholic fatty liver disease with mild transaminitis:  Improved.  PLAN/>  1. I have discussed nonalcoholic fatty liver disease at great length      with the patient.  I recommended gradual weight loss with a goal of      1-2 pounds per week.  2. I have given him information on fatty liver as well as low  fat, low      cholesterol diet.  3. He does not consume alcohol on a regular basis, nor Tylenol or      Aleve.  4. We will recheck LFT's in two months and schedule follow-up with Dr.      Jena Gauss in six months.       Lorenza Burton, N.P.  Electronically Signed     R. Roetta Sessions, M.D.  Electronically Signed    KJ/MEDQ  D:  09/20/2007  T:  09/21/2007  Job:  [id number]   cc:   Valetta Mole. Swords, MD

## 2011-04-12 NOTE — Op Note (Signed)
NAME:  TYSHAN, ENDERLE                ACCOUNT NO.:  [id number]   MEDICAL RECORD NO.:  [id number]          PATIENT TYPE:  AMB   LOCATION:  DAY                           FACILITY:  [facility]   PHYSICIAN:  R. Roetta Sessions, M.D. DATE OF BIRTH:  19-Aug-1961   DATE OF PROCEDURE:  DATE OF DISCHARGE:                               OPERATIVE REPORT   PROCEDURE:  Diagnostic EGD followed by colonoscopy.   INDICATIONS FOR PROCEDURE:  A 50 year old gentleman with longstanding  gastroesophageal reflux disease and intermittent dyspepsia sometimes  postprandially, alternating constipation and diarrhea, intermittent  hematochezia.  EGD and colonoscopy are now being done, this approach has  been discussed with the patient at length.  The potential risks,  benefits and alternatives have been reviewed, questions answered.  Please see the documentation in the medical record for more information.   MONITORING:  O2 saturation, blood pressure, pulse and respirations were  monitored throughout the entirety of both procedures.   CONSCIOUS SEDATION:  Versed 5 mg IV, Demerol 100 mg IV in divided doses.   INSTRUMENTATION:  Pentax video chip system.   FINDINGS ON EGD:  Examination of the tubular esophagus revealed a wide  open EG junction but a noncritical ring was present with a couple of  tiny overlying erosions. Esophageal mucosa otherwise appeared normal.  The EG junction easily traversed.   STOMACH:  The gastric cavity was empty and insufflated well with air.  A  thorough examination of the gastric mucosa including a retroflexed view  of the proximal stomach and esophagogastric junction demonstrated only a  small hiatal hernia.  The gastric mucosa otherwise appeared normal.  The  pylorus was patent and easily traversed.  Examination of the bulb and  second portion revealed no abnormalities.   THERAPEUTIC/DIAGNOSTIC MANEUVERS PERFORMED:  None.   The patient tolerated the procedure well and was prepared for  colonoscopy.   Digital rectal exam revealed no abnormalities.   ENDOSCOPIC FINDINGS:  The prep was adequate.   COLON:  The colonic mucosa was surveyed from the rectosigmoid junction  through the left transverse and right colon to the area of the  appendiceal orifice, ileocecal valve and cecum.  These structures were  well seen and photographed for the record.  From this level, the scope  was slowly withdrawn and all previously mentioned mucosal surfaces were  again seen.  The colonic mucosa appeared entirely normal.  The scope was  pulled down to the rectum where a thorough examination of the rectal  mucosa including an en face view of the anal canal demonstrated  minimally friable anal canal tissue, otherwise the rectal mucosa  appeared normal.  The patient tolerated the procedure well and was  reacted in endoscopy.   IMPRESSION:  1. Patulous esophagogastric junction, noncritical Schatzki's ring,      overlying distal esophageal erosion consistent with mild reflux      esophagitis, otherwise normal esophagus.  2. Small hiatal hernia otherwise normal stomach, D1 and D2.   COLONOSCOPY FINDINGS:  Friable anal canal otherwise normal colon and  rectum.   RECOMMENDATIONS:  1. Begin AcipHex 20  mg orally daily. Stop Prilosec, antireflux      literature provided to Mr. Hart Rochester.  2. Daily fiber supplement. A 10-day course of Anusol HC suppositories      one per rectum at bedtime. Appointment to see [place] back in office in      3-4 weeks.      [name], M.D.  Electronically Signed     RMR/MEDQ  D:  08/28/2007  T:  08/28/2007  Job:  [id number]   cc:   Valetta Mole. Swords, MD  [address] [place]  [place] [address]

## 2011-04-12 NOTE — H&P (Signed)
NAME:  YAZEN, ROSKO                ACCOUNT NO.:  [id number]   MEDICAL RECORD NO.:  [id number]         PATIENT TYPE:  AMB   LOCATION:  DAY                           FACILITY:  [facility]   PHYSICIAN:  R. Roetta Sessions, M.D. DATE OF BIRTH:  1961-10-31   DATE OF ADMISSION:  DATE OF DISCHARGE:  LH                              HISTORY & PHYSICAL   CHIEF COMPLAINT:  Acid reflux and hemorrhoids.   HISTORY OF PRESENT ILLNESS:  Mr. Niday is a 50 year old Caucasian  gentleman who presents today for further evaluation of acid reflux and  hemorrhoids.  He is on medication for chronic GERD.  He cannot recall a  name.  He states that over the past six months he has had three or four  episodes of severe epigastric burning which lasts for a day or so at a  time.  He has never had workup of his GERD.  He has had it for several  years.  Over the past year he has gained about 10 or 12 pounds since he  has been married.  He denies any dysphagia or odynophagia.  No nausea or  vomiting.  His bowel movements alternate.  He has been going from  constipation to diarrhea.  He thinks some of his constipation is  secondary to his blood pressure pills.  He denies any black stools  except for when he takes Pepto-Bismol.  He has had some rectal bleeding  which he feels is from a hemorrhoid.  He uses Preparation H p.r.n.  He  has never had a colonoscopy.  He also voices concerns about having his  prostate checked.  He has urinary frequency at times which he is not  sure if it is related to his diuretic.  He has a brother-in-law who had  prostate cancer at a relatively young age as well as a friend who died  with prostate cancer at age 16.   CURRENT MEDICATIONS:  1. Prilosec HCTZ daily.  2. Acid reducer daily.   ALLERGIES:  No known drug allergies.   PAST MEDICAL HISTORY:  Hypertension.  He has a history of  hypercholesterolemia, but has not been able to tolerate medications in  the past.  Appendectomy.   FAMILY  HISTORY:  A paternal grandfather had prostate problems, possibly  cancer.  His father deceased at age 73 with MI.  No family history of  colorectal cancer.   SOCIAL HISTORY:  He is married.  He has no children.  He is employed  with [employer].  He is a nonsmoker.  He consumes alcohol  only when he goes on vacation.   REVIEW OF SYSTEMS:  GI:  See HPI. CONSTITUTIONAL:  See HPI.  CARDIOPULMONARY:  No chest pain or shortness of breath.  See HPI for GU.   PHYSICAL EXAMINATION:  VITAL SIGNS:  Weight 183, height 5 feet 6 inches,  temp 98.3, blood pressure 130/96, pulse 80.  GENERAL:  Pleasant, obese Caucasian male in no acute distress.  SKIN:  Warm and dry, no jaundice.  HEENT:  Sclerae nonicteric, oropharyngeal mucosa moist and pink.  No  lesions or erythema or exudate.  No lymphadenopathy or thyromegaly.  CHEST:  Lungs are clear to auscultation.  CARDIAC:  Reveals regular rate and rhythm, normal S1, S2.  No murmurs,  rubs or gallops.  ABDOMEN:  Positive bowel sounds.  Soft, nontender, nondistended.  Liver  is easily palpable at 7 fingerbreadths below the right costal margin in  the mid clavicular line.  Margins are smooth and nontender.  Spleen is  not palpated.  No abdominal bruits or hernias.  EXTREMITIES:  No edema.   IMPRESSION:  Jonahtan is a 50 year old gentleman with:   1. Chronic gastroesophageal reflux disease with intermittent      epigastric burning sometimes postprandially.  2. Alternating constipation, diarrhea with intermittent hematochezia.      He is approaching colorectal cancer screening age and given change      of bowel movements and __________ hematochezia recommend for      __________ colonoscopy for diagnostic purposes.  3. Liver appears somewhat prominent on exam.  Will begin with checking      LFTs and potentially will need abdominal ultrasound.  4. Hypercholesterolemia treated.  5. Urinary frequency.   PLAN:  1. CMET, CBC, PSA, lipid panel.   2. Colonoscopy and EGD.  3. He will call with his complete medication list and will make      adjustments as needed for his GERD.  4. May need abdominal ultrasound.  Will await EGD findings.      Tana Coast, P.AJonathon Bellows, M.D.  Electronically Signed    LL/MEDQ  D:  08/21/2007  T:  08/22/2007  Job:  [id number]   cc:   Valetta Mole. Swords, MD  [address] [place]  [place] [address]

## 2011-04-12 NOTE — Assessment & Plan Note (Signed)
NAMEMarland [name]  SY, SAINTJEAN                 CHART#:  [id number]   DATE:  03/20/2008                       DOB:  23-Jul-1961   Followup erosive reflux esophagitis, elevated transaminase, probable  fatty infiltration of liver on ultrasound.  The patient was last seen  September 20, 2007.  He has lost several pounds since he was last here,  trying to diet, healthy lifestyle.  Aciphex 20 grams orally once daily  is well controlling his reflux symptoms.  His aminotransferases have  improved.  They are not normal last assay September 19, 2007.  His GOT,  GPT were 89 and 120 respectively.  Viral markers were negative.  Ferritin was 305 on September 19, 2007.  He is seeing a  Secondary school teacher in [place].   The patient has a little bit of discomfort and irritation around his  anus from time to time (found to have a friable anal canal, but  otherwise unremarkable colonoscopy last year).   CURRENT MEDICATIONS:  See updated list.   ALLERGIES:  No known drug allergies.   EXAM:  Today he appears well.  Weight 177, height 5 feet 6 inches, temperature 98.1, BP 128/88, pulse  64.   CHEST:  Lungs clear to auscultation.  CARDIAC:  Regular rate and rhythm without murmur, rub.  ABDOMEN:  Nondistended, positive bowel sounds.  Soft, nontender without  appreciable mass organomegaly.   ASSESSMENT:  1. Gastroesophageal reflux disease/reflux esophagitis.  Symptoms now      well controlled on Aciphex.  Recommended continued healthy      lifestyle with exercise and additional weight loss.  2. Elevated transaminase almost certainly secondary to      steatohepatitis.   RECOMMENDATIONS:  Same as for gastroesophageal reflux disease.  He is  going to see Dr. Cato Mulligan in for months and going to have blood work.  Have given him prescription to request a liver profile be done at that  time and send [place] results via fax.   PLAN:  To see this nice gentleman back and 6 months from now.       [name], M.D.  Electronically Signed     RMR/MEDQ  D:  03/20/2008  T:  03/20/2008  Job:  [id number]   cc:   Valetta Mole. Swords, MD

## 2011-05-13 ENCOUNTER — Other Ambulatory Visit (INDEPENDENT_AMBULATORY_CARE_PROVIDER_SITE_OTHER): Payer: 59

## 2011-05-13 DIAGNOSIS — Z0000 Encounter for general adult medical examination without abnormal findings: Secondary | ICD-10-CM

## 2011-05-13 LAB — CBC WITH DIFFERENTIAL/PLATELET
Basophils Absolute: 0 10*3/uL (ref 0.0–0.1)
Eosinophils Absolute: 0.2 10*3/uL (ref 0.0–0.7)
Hemoglobin: 14.3 g/dL (ref 13.0–17.0)
Lymphocytes Relative: 21.1 % (ref 12.0–46.0)
Lymphs Abs: 2 10*3/uL (ref 0.7–4.0)
MCHC: 34.5 g/dL (ref 30.0–36.0)
Monocytes Relative: 7.3 % (ref 3.0–12.0)
Neutro Abs: 6.6 10*3/uL (ref 1.4–7.7)
Platelets: 266 10*3/uL (ref 150.0–400.0)
RDW: 13 % (ref 11.5–14.6)

## 2011-05-13 LAB — BASIC METABOLIC PANEL
BUN: 23 mg/dL (ref 6–23)
CO2: 31 mEq/L (ref 19–32)
Calcium: 10 mg/dL (ref 8.4–10.5)
GFR: 101.93 mL/min (ref >60.00)
Glucose, Bld: 93 mg/dL (ref 70–99)

## 2011-05-13 LAB — MICROALBUMIN / CREATININE URINE RATIO: Microalb Creat Ratio: 1.6 mg/g (ref 0.0–30.0)

## 2011-05-13 LAB — TSH: TSH: 2.83 u[IU]/mL (ref 0.35–5.50)

## 2011-05-13 LAB — POCT URINALYSIS DIPSTICK
Blood, UA: NEGATIVE
Ketones, UA: NEGATIVE
Protein, UA: NEGATIVE
Spec Grav, UA: 1.02
Urobilinogen, UA: 0.2
pH, UA: 5.5

## 2011-05-13 LAB — LIPID PANEL
HDL: 42.4 mg/dL (ref >39.00)
Triglycerides: 198 mg/dL — ABNORMAL HIGH (ref 0.0–149.0)
VLDL: 39.6 mg/dL (ref 0.0–40.0)

## 2011-05-13 LAB — HEPATIC FUNCTION PANEL
AST: 27 U/L (ref 0–37)
Albumin: 4.9 g/dL (ref 3.5–5.2)
Alkaline Phosphatase: 92 U/L (ref 39–117)
Total Bilirubin: 1 mg/dL (ref 0.3–1.2)

## 2011-05-13 LAB — HEMOGLOBIN A1C: Hgb A1c MFr Bld: 5.9 % (ref 4.6–6.5)

## 2011-05-13 LAB — LDL CHOLESTEROL, DIRECT: Direct LDL: 181 mg/dL

## 2011-05-19 ENCOUNTER — Encounter: Payer: Self-pay | Admitting: Internal Medicine

## 2011-05-20 ENCOUNTER — Encounter: Payer: Self-pay | Admitting: Internal Medicine

## 2011-05-20 ENCOUNTER — Ambulatory Visit (INDEPENDENT_AMBULATORY_CARE_PROVIDER_SITE_OTHER): Payer: 59 | Admitting: Internal Medicine

## 2011-05-20 DIAGNOSIS — E119 Type 2 diabetes mellitus without complications: Secondary | ICD-10-CM

## 2011-05-20 DIAGNOSIS — Z0000 Encounter for general adult medical examination without abnormal findings: Secondary | ICD-10-CM

## 2011-05-20 NOTE — Progress Notes (Signed)
  Subjective:    Patient ID: Alex [name], male    DOB: September 05, 1961, 50 y.o.   MRN: [id number]  HPI  cpx  Orthostatic reaction to wasp sting---recurred x 2  Past Medical History  Diagnosis Date  . Nonspecific elevation of levels of transaminase or lactic acid dehydrogenase (LDH)   . Mixed hyperlipidemia   . Unspecified essential hypertension   . Diabetes mellitus    Past Surgical History  Procedure Date  . Appendectomy     reports that he has never smoked. He does not have any smokeless tobacco history on file. He reports that he drinks alcohol. He reports that he does not use illicit drugs. family history includes COPD in his mother; Coronary artery disease in his father; Emphysema in his mother; Heart attack in his father; and Heart disease in his father. Not on File  Review of Systems  patient denies chest pain, shortness of breath, orthopnea. Denies lower extremity edema, abdominal pain, change in appetite, change in bowel movements. Patient denies rashes, musculoskeletal complaints. No other specific complaints in a complete review of systems.      Objective:   Physical Exam Well-developed male in no acute distress. HEENT exam atraumatic, normocephalic, extraocular muscles are intact. Conjunctivae are pink without exudate. Neck is supple without lymphadenopathy, thyromegaly, jugular venous distention. Chest is clear to auscultation without increased work of breathing. Cardiac exam S1-S2 are regular. The PMI is normal. No significant murmurs or gallops. Abdominal exam active bowel sounds, soft, nontender. No abdominal bruits. Extremities no clubbing cyanosis or edema. Peripheral pulses are normal without bruits. Neurologic exam alert and oriented without any motor or sensory deficits. Rectal exam normal tone prostate normal size without masses or asymmetry.     Assessment & Plan:  Well visit  DM--resolved with weight loss  BP--repeat bp better--he will monitor at home (bp at  work typically 120s/70s)  Lipids---recheck 3 months

## 2011-08-15 ENCOUNTER — Other Ambulatory Visit: Payer: 59

## 2011-08-22 ENCOUNTER — Ambulatory Visit: Payer: 59 | Admitting: Internal Medicine

## 2011-10-25 ENCOUNTER — Other Ambulatory Visit (INDEPENDENT_AMBULATORY_CARE_PROVIDER_SITE_OTHER): Payer: 59

## 2011-10-25 DIAGNOSIS — E785 Hyperlipidemia, unspecified: Secondary | ICD-10-CM

## 2011-10-25 LAB — LIPID PANEL
Cholesterol: 225 mg/dL — ABNORMAL HIGH (ref 0–200)
VLDL: 43.8 mg/dL — ABNORMAL HIGH (ref 0.0–40.0)

## 2011-10-28 NOTE — Progress Notes (Signed)
Quick Note:  Left a message for pt to return call. ______ 

## 2011-11-01 ENCOUNTER — Encounter: Payer: Self-pay | Admitting: Internal Medicine

## 2011-11-01 ENCOUNTER — Ambulatory Visit (INDEPENDENT_AMBULATORY_CARE_PROVIDER_SITE_OTHER): Payer: 59 | Admitting: Internal Medicine

## 2011-11-01 DIAGNOSIS — E119 Type 2 diabetes mellitus without complications: Secondary | ICD-10-CM

## 2011-11-01 DIAGNOSIS — I10 Essential (primary) hypertension: Secondary | ICD-10-CM

## 2011-11-01 NOTE — Progress Notes (Signed)
Patient ID: Alex [name], male   DOB: 1961/10/12, 50 y.o.   MRN: [id number]  patient comes in for followup of multiple medical problems including type 2 diabetes, hyperlipidemia, hypertension. The patient does not check blood sugar or blood pressure at home. The patetient does not follow an exercise or diet program. The patient denies any polyuria, polydipsia.  In the past the patient has gone to diabetic treatment center. The patient is tolerating medications  Without difficulty. The patient has not required any medications   Past Medical History  Diagnosis Date  . Nonspecific elevation of levels of transaminase or lactic acid dehydrogenase (LDH)   . Mixed hyperlipidemia   . Unspecified essential hypertension   . Diabetes mellitus     History   Social History  . Marital Status: Married    Spouse Name: N/A    Number of Children: N/A  . Years of Education: N/A   Occupational History  . Not on file.   Social History Main Topics  . Smoking status: Never Smoker   . Smokeless tobacco: Not on file  . Alcohol Use: Yes  . Drug Use: No  . Sexually Active: Not on file   Other Topics Concern  . Not on file   Social History Narrative  . No narrative on file    Past Surgical History  Procedure Date  . Appendectomy     Family History  Problem Relation Age of Onset  . COPD Mother   . Emphysema Mother   . Heart disease Father   . Coronary artery disease Father   . Heart attack Father     No Known Allergies  Current Outpatient Prescriptions on File Prior to Visit  Medication Sig Dispense Refill  . Coenzyme Q10 150 MG CAPS Take 150 capsules by mouth daily.        . Omega-3 Fatty Acids (RA FISH OIL) 1000 MG CAPS Take by mouth.        . propranolol (INDERAL) 10 MG tablet Take 10 mg by mouth daily as needed.        . vitamin B-12 (CYANOCOBALAMIN) 500 MCG tablet Take 500 mcg by mouth daily.           patient denies chest pain, shortness of breath, orthopnea. Denies lower  extremity edema, abdominal pain, change in appetite, change in bowel movements. Patient denies rashes, musculoskeletal complaints. No other specific complaints in a complete review of systems.   BP 168/100  Pulse 68  Temp(Src) 98.3 F (36.8 C) (Oral)  Ht 5\' 3"  (1.6 m)  Wt 156 lb (70.761 kg)  BMI 27.63 kg/m2  well-developed well-nourished male in no acute distress. HEENT exam atraumatic, normocephalic, neck supple without jugular venous distention. Chest clear to auscultation cardiac exam S1-S2 are regular. Abdominal exam overweight with bowel sounds, soft and nontender. Extremities no edema. Neurologic exam is alert with a normal gait.

## 2011-11-06 NOTE — Assessment & Plan Note (Signed)
No symptoms. He currently is on no medications. He has done a fabulous job taking care of himself. He has lost a tremendous amount of weight since the diagnosis of diabetes.

## 2011-11-06 NOTE — Assessment & Plan Note (Signed)
Well controlled. Continue current medications  

## 2012-03-22 ENCOUNTER — Other Ambulatory Visit: Payer: 59

## 2012-04-16 ENCOUNTER — Other Ambulatory Visit (INDEPENDENT_AMBULATORY_CARE_PROVIDER_SITE_OTHER): Payer: 59

## 2012-04-16 DIAGNOSIS — E119 Type 2 diabetes mellitus without complications: Secondary | ICD-10-CM

## 2012-04-16 LAB — LIPID PANEL
Cholesterol: 207 mg/dL — ABNORMAL HIGH (ref 0–200)
HDL: 37.8 mg/dL — ABNORMAL LOW (ref >39.00)
VLDL: 63.8 mg/dL — ABNORMAL HIGH (ref 0.0–40.0)

## 2012-04-16 LAB — BASIC METABOLIC PANEL
Chloride: 104 mEq/L (ref 96–112)
Potassium: 5.1 mEq/L (ref 3.5–5.1)
Sodium: 140 mEq/L (ref 135–145)

## 2012-04-16 LAB — HEMOGLOBIN A1C: Hgb A1c MFr Bld: 5.5 % (ref 4.6–6.5)

## 2012-04-16 LAB — HEPATIC FUNCTION PANEL
Albumin: 4.4 g/dL (ref 3.5–5.2)
Total Protein: 8.1 g/dL (ref 6.0–8.3)

## 2013-04-10 ENCOUNTER — Ambulatory Visit (INDEPENDENT_AMBULATORY_CARE_PROVIDER_SITE_OTHER): Payer: 59 | Admitting: Family

## 2013-04-10 ENCOUNTER — Encounter: Payer: Self-pay | Admitting: Family

## 2013-04-10 ENCOUNTER — Telehealth: Payer: Self-pay | Admitting: Family

## 2013-04-10 VITALS — BP 154/98 | HR 68 | Wt 163.0 lb

## 2013-04-10 DIAGNOSIS — R599 Enlarged lymph nodes, unspecified: Secondary | ICD-10-CM

## 2013-04-10 DIAGNOSIS — E785 Hyperlipidemia, unspecified: Secondary | ICD-10-CM

## 2013-04-10 DIAGNOSIS — R590 Localized enlarged lymph nodes: Secondary | ICD-10-CM

## 2013-04-10 DIAGNOSIS — E119 Type 2 diabetes mellitus without complications: Secondary | ICD-10-CM

## 2013-04-10 DIAGNOSIS — I10 Essential (primary) hypertension: Secondary | ICD-10-CM

## 2013-04-10 LAB — LIPID PANEL
Cholesterol: 206 mg/dL — ABNORMAL HIGH (ref 0–200)
HDL: 34.2 mg/dL — ABNORMAL LOW (ref >39.00)
Triglycerides: 193 mg/dL — ABNORMAL HIGH (ref 0.0–149.0)

## 2013-04-10 LAB — CBC WITH DIFFERENTIAL/PLATELET
Basophils Relative: 0.6 % (ref 0.0–3.0)
Eosinophils Absolute: 0.2 10*3/uL (ref 0.0–0.7)
Eosinophils Relative: 2.5 % (ref 0.0–5.0)
Lymphocytes Relative: 27.7 % (ref 12.0–46.0)
MCHC: 34.3 g/dL (ref 30.0–36.0)
Neutrophils Relative %: 62.5 % (ref 43.0–77.0)
Platelets: 278 10*3/uL (ref 150.0–400.0)
RBC: 5.14 Mil/uL (ref 4.22–5.81)
WBC: 6.7 10*3/uL (ref 4.5–10.5)

## 2013-04-10 LAB — BASIC METABOLIC PANEL
Calcium: 9.6 mg/dL (ref 8.4–10.5)
Creatinine, Ser: 0.8 mg/dL (ref 0.4–1.5)
GFR: 101.18 mL/min (ref >60.00)
Glucose, Bld: 88 mg/dL (ref 70–99)
Sodium: 140 mEq/L (ref 135–145)

## 2013-04-10 LAB — HEPATIC FUNCTION PANEL
Albumin: 4.2 g/dL (ref 3.5–5.2)
Alkaline Phosphatase: 75 U/L (ref 39–117)
Total Protein: 8.3 g/dL (ref 6.0–8.3)

## 2013-04-10 LAB — LDL CHOLESTEROL, DIRECT: Direct LDL: 127.2 mg/dL

## 2013-04-10 LAB — HEMOGLOBIN A1C: Hgb A1c MFr Bld: 5.6 % (ref 4.6–6.5)

## 2013-04-10 MED ORDER — LOSARTAN POTASSIUM 50 MG PO TABS: 50.0000 mg | ORAL_TABLET | Freq: Every day | ORAL | Status: DC

## 2013-04-10 NOTE — Patient Instructions (Addendum)
Exercise to Stay Healthy Exercise helps you become and stay healthy. EXERCISE IDEAS AND TIPS Choose exercises that:  You enjoy.  Fit into your day. You do not need to exercise really hard to be healthy. You can do exercises at a slow or medium level and stay healthy. You can:  Stretch before and after working out.  Try yoga, Pilates, or tai chi.  Lift weights.  Walk fast, swim, jog, run, climb stairs, bicycle, dance, or rollerskate.  Take aerobic classes. Exercises that burn about 150 calories:  Running 1  miles in 15 minutes.  Playing volleyball for 45 to 60 minutes.  Washing and waxing a car for 45 to 60 minutes.  Playing touch football for 45 minutes.  Walking 1  miles in 35 minutes.  Pushing a stroller 1  miles in 30 minutes.  Playing basketball for 30 minutes.  Raking leaves for 30 minutes.  Bicycling 5 miles in 30 minutes.  Walking 2 miles in 30 minutes.  Dancing for 30 minutes.  Shoveling snow for 15 minutes.  Swimming laps for 20 minutes.  Walking up stairs for 15 minutes.  Bicycling 4 miles in 15 minutes.  Gardening for 30 to 45 minutes.  Jumping rope for 15 minutes.  Washing windows or floors for 45 to 60 minutes. Document Released: 12/17/2010 Document Revised: 02/06/2012 Document Reviewed: 12/17/2010 [employer] Patient Information 2013 [employer].  

## 2013-04-10 NOTE — Telephone Encounter (Signed)
Note faxed.

## 2013-04-10 NOTE — Progress Notes (Signed)
Subjective:    Patient ID: Alex [name], male    DOB: [birth year], 52 y.o.   MRN: [id number]  HPI 52 year old white male, nonsmoker, patient of Dr. Cato Mulligan is in today with complaints of a lymph node to the base of his head on the right side that's been swollen x4 days. He denies any sneezing, coughing or congestion. Reports ringing in his right ear 3 days ago. He reports lavaging his right ear and removed a moderate amount of wax from his ear. Denies any dental concerns.  Patient also has a history of type 2 diabetes, hyperlipidemia, and hypertension. He has not had an office visit in approximately 2 years. He had labs performed.   Review of Systems  Constitutional: Negative.   HENT: Negative.        Node swelling to the base of the right neck.  Respiratory: Negative.   Cardiovascular: Negative.   Gastrointestinal: Negative.   Endocrine: Negative.   Genitourinary: Negative.   Musculoskeletal: Negative.   Skin: Negative.   Neurological: Negative.   Hematological: Negative.   Psychiatric/Behavioral: Negative.    Past Medical History  Diagnosis Date  . Nonspecific elevation of levels of transaminase or lactic acid dehydrogenase (LDH)   . Mixed hyperlipidemia   . Unspecified essential hypertension   . Diabetes mellitus     History   Social History  . Marital Status: Married    Spouse Name: N/A    Number of Children: N/A  . Years of Education: N/A   Occupational History  . Not on file.   Social History Main Topics  . Smoking status: Never Smoker   . Smokeless tobacco: Not on file  . Alcohol Use: Yes  . Drug Use: No  . Sexually Active: Not on file   Other Topics Concern  . Not on file   Social History Narrative  . No narrative on file    Past Surgical History  Procedure Laterality Date  . Appendectomy      Family History  Problem Relation Age of Onset  . COPD Mother   . Emphysema Mother   . Heart disease Father   . Coronary artery disease Father   .  Heart attack Father     No Known Allergies  Current Outpatient Prescriptions on File Prior to Visit  Medication Sig Dispense Refill  . Coenzyme Q10 150 MG CAPS Take 150 capsules by mouth daily.        . Omega-3 Fatty Acids (RA FISH OIL) 1000 MG CAPS Take by mouth.        . vitamin B-12 (CYANOCOBALAMIN) 500 MCG tablet Take 500 mcg by mouth daily.         No current facility-administered medications on file prior to visit.    BP 154/98  Pulse 68  Wt 163 lb (73.936 kg)  BMI 28.88 kg/m2  SpO2 98%chart    Objective:   Physical Exam  Constitutional: He is oriented to person, place, and time. He appears well-developed and well-nourished.  HENT:  Right Ear: External ear normal.  Left Ear: External ear normal.  Nose: Nose normal.  Mouth/Throat: Oropharynx is clear and moist.  Neck: Normal range of motion. Neck supple. No thyromegaly present.  Right postoccipital lymph node to palpation. It is mobile, nontender, and soft.  Cardiovascular: Normal rate, regular rhythm and normal heart sounds.   Pulmonary/Chest: Effort normal and breath sounds normal.  Abdominal: Bowel sounds are normal.  Neurological: He is alert and oriented to person, place, and time.  Skin: Skin is warm and dry.  Psychiatric: He has a normal mood and affect.          Assessment & Plan:  Assessment:  1. Post occipital lymphadenopathy 2. Type 2 diabetes 3. Hypertension 4. Hyperlipidemia  Plan: Advised patient that we'll keep an eye on the lymph node to the right occipital area. He will call if he continues to have lymphadenopathy over the next 2 weeks. At that time, we'll consider an ultrasound. He will communicate with me by my chart. Lab sent to include BMP, CBC, lipids, A1c, LFTs will notify patient pending results. Start losartan 50 mg once daily to reduce blood pressure. We'll follow with patient pen and the results of his labs, in 3 weeks for recheck of his blood pressure and sooner as needed.

## 2013-04-10 NOTE — Telephone Encounter (Signed)
Pt needs work note for today visit fax to [employer] [phone]

## 2013-08-20 ENCOUNTER — Other Ambulatory Visit: Payer: Self-pay | Admitting: Family

## 2013-09-14 ENCOUNTER — Other Ambulatory Visit: Payer: Self-pay | Admitting: Internal Medicine

## 2013-10-03 ENCOUNTER — Other Ambulatory Visit: Payer: Self-pay

## 2013-10-11 ENCOUNTER — Other Ambulatory Visit: Payer: Self-pay | Admitting: Internal Medicine

## 2013-11-01 ENCOUNTER — Ambulatory Visit (INDEPENDENT_AMBULATORY_CARE_PROVIDER_SITE_OTHER): Payer: 59 | Admitting: Internal Medicine

## 2013-11-01 ENCOUNTER — Encounter: Payer: Self-pay | Admitting: Internal Medicine

## 2013-11-01 VITALS — BP 160/100 | HR 72 | Temp 98.0°F | Ht 63.0 in | Wt 165.0 lb

## 2013-11-01 DIAGNOSIS — E119 Type 2 diabetes mellitus without complications: Secondary | ICD-10-CM

## 2013-11-01 DIAGNOSIS — E1169 Type 2 diabetes mellitus with other specified complication: Secondary | ICD-10-CM

## 2013-11-01 DIAGNOSIS — E782 Mixed hyperlipidemia: Secondary | ICD-10-CM

## 2013-11-01 DIAGNOSIS — I10 Essential (primary) hypertension: Secondary | ICD-10-CM

## 2013-11-01 DIAGNOSIS — Z23 Encounter for immunization: Secondary | ICD-10-CM

## 2013-11-01 LAB — LIPID PANEL
Cholesterol: 236 mg/dL — ABNORMAL HIGH (ref 0–200)
HDL: 35.2 mg/dL — ABNORMAL LOW (ref >39.00)
Total CHOL/HDL Ratio: 7
Triglycerides: 308 mg/dL — ABNORMAL HIGH (ref 0.0–149.0)

## 2013-11-01 LAB — BASIC METABOLIC PANEL
CO2: 30 mEq/L (ref 19–32)
Chloride: 104 mEq/L (ref 96–112)
Creatinine, Ser: 0.8 mg/dL (ref 0.4–1.5)
Potassium: 5.1 mEq/L (ref 3.5–5.1)
Sodium: 140 mEq/L (ref 135–145)

## 2013-11-01 LAB — HEPATIC FUNCTION PANEL
ALT: 31 U/L (ref 0–53)
AST: 29 U/L (ref 0–37)
Albumin: 4.5 g/dL (ref 3.5–5.2)
Alkaline Phosphatase: 89 U/L (ref 39–117)

## 2013-11-01 LAB — TSH: TSH: 2.48 u[IU]/mL (ref 0.35–5.50)

## 2013-11-01 LAB — HEMOGLOBIN A1C: Hgb A1c MFr Bld: 5.9 % (ref 4.6–6.5)

## 2013-11-01 LAB — MICROALBUMIN / CREATININE URINE RATIO: Microalb Creat Ratio: 1.1 mg/g (ref 0.0–30.0)

## 2013-11-01 MED ORDER — LISINOPRIL 40 MG PO TABS: 40.0000 mg | ORAL_TABLET | Freq: Every day | ORAL | Status: DC

## 2013-11-01 NOTE — Progress Notes (Signed)
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 Nonspecific elevation of levels of transaminase or lactic acid dehydrogenase (LDH)   . Mixed hyperlipidemia   . Unspecified essential hypertension   . Diabetes mellitus     History   Social History  . Marital Status: Married    Spouse Name: N/A    Number of Children: N/A  . Years of Education: N/A   Occupational History  . Not on file.   Social History Main Topics  . Smoking status: Never Smoker   . Smokeless tobacco: Not on file  . Alcohol Use: Yes  . Drug Use: No  . Sexual Activity: Not on file   Other Topics Concern  . Not on file   Social History Narrative  . No narrative on file    Past Surgical History  Procedure Laterality Date  . Appendectomy      Family History  Problem Relation Age of Onset  . COPD Mother   . Emphysema Mother   . Heart disease Father   . Coronary artery disease Father   . Heart attack Father     No Known Allergies  Current Outpatient Prescriptions on File Prior to Visit  Medication Sig Dispense Refill  . Coenzyme Q10 150 MG CAPS Take 150 capsules by mouth daily.        . Omega-3 Fatty Acids (RA FISH OIL) 1000 MG CAPS Take by mouth.        . vitamin B-12 (CYANOCOBALAMIN) 500 MCG tablet Take 500 mcg by mouth daily.         No current facility-administered medications on file prior to visit.     patient denies chest pain, shortness of breath, orthopnea. Denies lower extremity edema, abdominal pain, change in appetite, change in bowel movements. Patient denies rashes, musculoskeletal complaints. No other specific complaints in a complete review of systems.   BP 160/100  Pulse 72  Temp(Src) 98 F (36.7 C) (Oral)  Ht 5\' 3"  (1.6 m)  Wt 165 lb (74.844 kg)  BMI 29.24 kg/m2   well-developed  well-nourished male in no acute distress. HEENT exam atraumatic, normocephalic, neck supple without jugular venous distention. Chest clear to auscultation cardiac exam S1-S2 are regular. Abdominal exam overweight with bowel sounds, soft and nontender. Extremities no edema. Neurologic exam is alert with a normal gait.

## 2013-11-03 NOTE — Assessment & Plan Note (Signed)
BP Readings from Last 3 Encounters:  11/01/13 160/100  04/10/13 154/98  11/01/11 168/100   bp not controlled Discussed meds Change to lisinopril

## 2013-11-03 NOTE — Assessment & Plan Note (Signed)
Check labs today.

## 2013-11-03 NOTE — Assessment & Plan Note (Signed)
Check labs today discused need for weight loss

## 2013-11-05 ENCOUNTER — Other Ambulatory Visit: Payer: Self-pay | Admitting: *Deleted

## 2013-11-05 MED ORDER — ATORVASTATIN CALCIUM 20 MG PO TABS: 20.0000 mg | ORAL_TABLET | Freq: Every day | ORAL | Status: DC

## 2013-11-15 ENCOUNTER — Ambulatory Visit (INDEPENDENT_AMBULATORY_CARE_PROVIDER_SITE_OTHER): Payer: 59 | Admitting: Internal Medicine

## 2013-11-15 VITALS — BP 142/90

## 2013-11-15 DIAGNOSIS — I10 Essential (primary) hypertension: Secondary | ICD-10-CM

## 2013-11-15 NOTE — Progress Notes (Signed)
Pt comes in today for a nurse visit BP check.  His BP today was 142/90 in right arm.  He states his home BPs are around 132/78.  He is going to check them at home for a week and call back with readings cause he does not check them on a regular basis.  He takes lisinopril 40 mg once daily.  Note forwarded to Dr Cato Mulligan for further review

## 2014-04-28 LAB — HM DIABETES EYE EXAM

## 2014-05-12 ENCOUNTER — Encounter: Payer: Self-pay | Admitting: Internal Medicine

## 2014-05-12 ENCOUNTER — Ambulatory Visit (INDEPENDENT_AMBULATORY_CARE_PROVIDER_SITE_OTHER): Payer: 59 | Admitting: Internal Medicine

## 2014-05-12 VITALS — BP 130/75 | HR 88 | Temp 98.1°F | Ht 63.0 in | Wt 167.0 lb

## 2014-05-12 DIAGNOSIS — E782 Mixed hyperlipidemia: Secondary | ICD-10-CM

## 2014-05-12 DIAGNOSIS — E119 Type 2 diabetes mellitus without complications: Secondary | ICD-10-CM

## 2014-05-12 DIAGNOSIS — I10 Essential (primary) hypertension: Secondary | ICD-10-CM

## 2014-05-12 LAB — BASIC METABOLIC PANEL
BUN: 16 mg/dL (ref 6–23)
CO2: 29 mEq/L (ref 19–32)
Calcium: 10.1 mg/dL (ref 8.4–10.5)
Chloride: 102 mEq/L (ref 96–112)
Creatinine, Ser: 0.9 mg/dL (ref 0.4–1.5)
GFR: 94.27 mL/min (ref >60.00)
GLUCOSE: 117 mg/dL — AB (ref 70–99)
Potassium: 4.7 mEq/L (ref 3.5–5.1)
Sodium: 139 mEq/L (ref 135–145)

## 2014-05-12 LAB — HEMOGLOBIN A1C: Hgb A1c MFr Bld: 5.9 % (ref 4.6–6.5)

## 2014-05-12 LAB — HEPATIC FUNCTION PANEL
ALBUMIN: 4.7 g/dL (ref 3.5–5.2)
ALT: 32 U/L (ref 0–53)
AST: 27 U/L (ref 0–37)
Alkaline Phosphatase: 74 U/L (ref 39–117)
Bilirubin, Direct: 0 mg/dL (ref 0.0–0.3)
Total Bilirubin: 0.5 mg/dL (ref 0.2–1.2)
Total Protein: 8.8 g/dL — ABNORMAL HIGH (ref 6.0–8.3)

## 2014-05-12 LAB — MICROALBUMIN / CREATININE URINE RATIO
Creatinine,U: 107 mg/dL
MICROALB/CREAT RATIO: 1.5 mg/g (ref 0.0–30.0)
Microalb, Ur: 1.6 mg/dL (ref 0.0–1.9)

## 2014-05-12 LAB — LIPID PANEL
CHOLESTEROL: 229 mg/dL — AB (ref 0–200)
HDL: 37.4 mg/dL — AB (ref >39.00)
LDL Cholesterol: 164 mg/dL — ABNORMAL HIGH (ref 0–99)
NONHDL: 191.6
Total CHOL/HDL Ratio: 6
Triglycerides: 140 mg/dL (ref 0.0–149.0)
VLDL: 28 mg/dL (ref 0.0–40.0)

## 2014-05-12 LAB — HM DIABETES FOOT EXAM

## 2014-05-12 MED ORDER — ATORVASTATIN CALCIUM 40 MG PO TABS: 40.0000 mg | ORAL_TABLET | Freq: Every day | ORAL | Status: DC

## 2014-05-12 MED ORDER — AMLODIPINE BESYLATE 5 MG PO TABS: 5.0000 mg | ORAL_TABLET | Freq: Every day | ORAL | Status: DC

## 2014-05-12 NOTE — Assessment & Plan Note (Signed)
Needs f/u labs Check today

## 2014-05-12 NOTE — Assessment & Plan Note (Signed)
Lab Results  Component Value Date   HGBA1C 5.9 11/01/2013   Will check labs today

## 2014-05-12 NOTE — Assessment & Plan Note (Signed)
Will increase lisinopril and add amlodipine

## 2014-05-12 NOTE — Progress Notes (Signed)
Pre visit review using our clinic review tool, if applicable. No additional management support is needed unless otherwise documented below in the visit note. 

## 2014-05-12 NOTE — Progress Notes (Signed)
htn- home bps 130s/90s  Dm- note weight gain. He has recently started walking 3 miles daily  Lipids- needs f/u  He checks feet daily and had recent eye exam in [place]  Past Medical History  Diagnosis Date  . Nonspecific elevation of levels of transaminase or lactic acid dehydrogenase (LDH)   . Mixed hyperlipidemia   . Unspecified essential hypertension   . Diabetes mellitus     History   Social History  . Marital Status: Married    Spouse Name: N/A    Number of Children: N/A  . Years of Education: N/A   Occupational History  . Not on file.   Social History Main Topics  . Smoking status: Never Smoker   . Smokeless tobacco: Not on file  . Alcohol Use: Yes  . Drug Use: No  . Sexual Activity: Not on file   Other Topics Concern  . Not on file   Social History Narrative  . No narrative on file    Past Surgical History  Procedure Laterality Date  . Appendectomy      Family History  Problem Relation Age of Onset  . COPD Mother   . Emphysema Mother   . Heart disease Father   . Coronary artery disease Father   . Heart attack Father     No Known Allergies  Current Outpatient Prescriptions on File Prior to Visit  Medication Sig Dispense Refill  . atorvastatin (LIPITOR) 20 MG tablet Take 1 tablet (20 mg total) by mouth daily.  90 tablet  3  . Coenzyme Q10 150 MG CAPS Take 150 capsules by mouth daily.        Marland Kitchen lisinopril (PRINIVIL,ZESTRIL) 40 MG tablet Take 1 tablet (40 mg total) by mouth daily.  90 tablet  3  . Omega-3 Fatty Acids (RA FISH OIL) 1000 MG CAPS Take by mouth.        . vitamin B-12 (CYANOCOBALAMIN) 500 MCG tablet Take 500 mcg by mouth daily.         No current facility-administered medications on file prior to visit.     patient denies chest pain, shortness of breath, orthopnea. Denies lower extremity edema, abdominal pain, change in appetite, change in bowel movements. Patient denies rashes, musculoskeletal complaints. No other specific  complaints in a complete review of systems.   BP 152/98  Pulse 88  Temp(Src) 98.1 F (36.7 C) (Oral)  Ht 5\' 3"  (1.6 m)  Wt 167 lb (75.751 kg)  BMI 29.59 kg/m2  well-developed well-nourished male in no acute distress. HEENT exam atraumatic, normocephalic, neck supple without jugular venous distention. Chest clear to auscultation cardiac exam S1-S2 are regular. Abdominal exam overweight with bowel sounds, soft and nontender. Extremities no edema. Neurologic exam is alert with a normal gait.   HYPERLIPIDEMIA Needs f/u labs Check today  DIABETES MELLITUS, TYPE II Lab Results  Component Value Date   HGBA1C 5.9 11/01/2013   Will check labs today  HYPERTENSION, ESSENTIAL NOS Will increase lisinopril and add amlodipine

## 2014-05-13 ENCOUNTER — Telehealth: Payer: Self-pay | Admitting: Internal Medicine

## 2014-05-13 NOTE — Telephone Encounter (Signed)
Need to know what kind of meter he has.  Left message for pt to call back

## 2014-05-13 NOTE — Telephone Encounter (Signed)
Relevant patient education assigned to patient using Emmi. ° °

## 2014-05-13 NOTE — Telephone Encounter (Signed)
Pt has accu check aviva plus

## 2014-05-13 NOTE — Telephone Encounter (Signed)
Pt was given a meter to ck BS, but not a rx for lancets. Pt will ck 1 x day. [facility] [address] 

## 2014-05-15 MED ORDER — ACCU-CHEK SOFTCLIX LANCET DEV MISC: 1.0000 | Freq: Every day | Status: DC

## 2014-05-15 MED ORDER — GLUCOSE BLOOD VI STRP: 1.0000 | ORAL_STRIP | Freq: Every day | Status: DC

## 2014-05-15 NOTE — Telephone Encounter (Signed)
rx sent in electronically 

## 2014-06-27 ENCOUNTER — Other Ambulatory Visit (INDEPENDENT_AMBULATORY_CARE_PROVIDER_SITE_OTHER): Payer: 59

## 2014-06-27 DIAGNOSIS — E785 Hyperlipidemia, unspecified: Secondary | ICD-10-CM

## 2014-06-27 LAB — LIPID PANEL
CHOLESTEROL: 108 mg/dL (ref 0–200)
HDL: 33.9 mg/dL — ABNORMAL LOW (ref >39.00)
LDL Cholesterol: 56 mg/dL (ref 0–99)
NonHDL: 74.1
Total CHOL/HDL Ratio: 3
Triglycerides: 93 mg/dL (ref 0.0–149.0)
VLDL: 18.6 mg/dL (ref 0.0–40.0)

## 2014-10-08 ENCOUNTER — Encounter: Payer: Self-pay | Admitting: Family Medicine

## 2014-10-08 ENCOUNTER — Ambulatory Visit (INDEPENDENT_AMBULATORY_CARE_PROVIDER_SITE_OTHER): Payer: 59 | Admitting: Family Medicine

## 2014-10-08 VITALS — BP 150/86 | HR 60 | Wt 163.0 lb

## 2014-10-08 DIAGNOSIS — I10 Essential (primary) hypertension: Secondary | ICD-10-CM

## 2014-10-08 DIAGNOSIS — E782 Mixed hyperlipidemia: Secondary | ICD-10-CM

## 2014-10-08 DIAGNOSIS — E119 Type 2 diabetes mellitus without complications: Secondary | ICD-10-CM

## 2014-10-08 NOTE — Assessment & Plan Note (Signed)
Mild poor control on amlodipine 5mg  and lisinopril 40mg . Dash diet and goal weight 145-150. Follow up in 1 month by phone by logging home BP. If still over 657 systolic will increase amlodipine to 10mg  and continue efforts then see each other in 3 months from today.

## 2014-10-08 NOTE — Assessment & Plan Note (Signed)
Good control with diet/exercise. COntinue current therapy. Up to date on health maintenance. Given excellent control we planned on pneumovax at age 53.

## 2014-10-08 NOTE — Assessment & Plan Note (Signed)
Continue atorvastatin 40mg  as well controlled.

## 2014-10-08 NOTE — Progress Notes (Signed)
Alex Reddish, MD Phone: [phone]  Subjective:  Patient presents today to establish care with me as their new primary care provider. Patient was formerly a patient of Dr. Leanne Chang. Chief complaint-noted.   Hypertension-mild poor control on lisinopril 40mg  and amlodipine 5mg   BP Readings from Last 3 Encounters:  10/08/14 150/86  05/12/14 130/75  11/15/13 142/90  walking 4.5 miles 3x a week including 2 flights of stairs 24 times.  Home BP monitoring-no Compliant with medications-yes without side effects ROS-Denies any CP, HA, SOB, blurry vision, LE edema, transient weakness, orthopnea, PND.   Hyperlipidemia-well controlled  Lab Results  Component Value Date   LDLCALC 56 06/27/2014  On statin: atorvastatin 40mg  Regular exercise: yes as above Diet: admits to some poor choices/overeating ROS- no chest pain or shortness of breath. No myalgias  DIABETES Type II-diet/exercise controlled  Lab Results  Component Value Date   HGBA1C 5.9 05/12/2014   HGBA1C 5.9 11/01/2013   HGBA1C 5.6 04/10/2013  Medications taking and tolerating-none, diet/exercise controlled Blood Sugars per patient-does not check Diet-a few poor choices Regular Exercise-yes as above but room to increase On Aspirin-we discussed if cannot control BP may start or when hits 58 On statin-yes Daily foot monitoring-yes  ROS- Denies Polyuria,Polydipsia, nocturia, Vision changes, feet or hand numbness/pain/tingling. Denies  Hypoglycemia symptoms (shaky, sweaty, hungry, weak anxious, tremor, palpitations, confusion, behavior change).   The following were reviewed and entered/updated in epic: Past Medical History  Diagnosis Date  . Nonspecific elevation of levels of transaminase or lactic acid dehydrogenase (LDH)   . Mixed hyperlipidemia   . Unspecified essential hypertension   . Diabetes mellitus    Patient Active Problem List   Diagnosis Date Noted  . Diabetes mellitus type II, controlled 11/11/2010   Priority: High  . HYPERLIPIDEMIA 12/17/2007    Priority: Medium  . Essential hypertension 12/17/2007    Priority: Medium  . Situational anxiety 07/22/2010    Priority: Low   Past Surgical History  Procedure Laterality Date  . Appendectomy      Family History  Problem Relation Age of Onset  . COPD Mother   . Emphysema Mother   . Heart disease Father     heavy smoker, 60  . Coronary artery disease Father   . Heart attack Father     Medications- reviewed and updated Current Outpatient Prescriptions  Medication Sig Dispense Refill  . amLODipine (NORVASC) 5 MG tablet Take 1 tablet (5 mg total) by mouth daily. 90 tablet 3  . atorvastatin (LIPITOR) 40 MG tablet Take 1 tablet (40 mg total) by mouth daily. 90 tablet 3  . Coenzyme Q10 150 MG CAPS Take 150 capsules by mouth daily.      Marland Kitchen glucose blood test strip 1 each by Other route daily. Use as instructed 100 each 12  . Lancet Devices (ACCU-CHEK SOFTCLIX) lancets 1 each by Other route daily. Use as instructed 1 each 11  . lisinopril (PRINIVIL,ZESTRIL) 40 MG tablet Take 1 tablet (40 mg total) by mouth daily. 90 tablet 3  . Multiple Vitamin (MULTIVITAMIN) tablet Take 1 tablet by mouth daily.    . Omega-3 Fatty Acids (RA FISH OIL) 1000 MG CAPS Take by mouth.      . vitamin B-12 (CYANOCOBALAMIN) 500 MCG tablet Take 500 mcg by mouth daily.       No current facility-administered medications for this visit.    Allergies-reviewed and updated No Known Allergies  History   Social History  . Marital Status: Married    Spouse  Name: N/A    Number of Children: N/A  . Years of Education: N/A   Social History Main Topics  . Smoking status: Never Smoker   . Smokeless tobacco: None  . Alcohol Use: 0.6 oz/week    1 Not specified per week  . Drug Use: No  . Sexual Activity: None   Other Topics Concern  . None   Social History Narrative   Married 2007. No kids. 3 yorkies.       Work [employer]: cars, guns,  target shooting, travel    ROS--See HPI   Objective: BP 150/86 mmHg  Pulse 60  Wt 163 lb (73.936 kg)  Right and left arm with manual recheck with similar value Gen: NAD, resting comfortably on table HEENT: Mucous membranes are moist.  CV: RRR no murmurs rubs or gallops Lungs: CTAB no crackles, wheeze, rhonchi Abdomen: soft/nontender/nondistended/normal bowel sounds.  Ext: no edema Skin: warm, dry, no rash Neuro: grossly normal, moves all extremities   Assessment/Plan:  Diabetes mellitus type II, controlled Good control with diet/exercise. COntinue current therapy. Up to date on health maintenance. Given excellent control we planned on pneumovax at age 30.   HYPERLIPIDEMIA Continue atorvastatin 40mg  as well controlled.   Essential hypertension Mild poor control on amlodipine 5mg  and lisinopril 40mg . Dash diet and goal weight 145-150. Follow up in 1 month by phone by logging home BP. If still over 073 systolic will increase amlodipine to 10mg  and continue efforts then see each other in 3 months from today.

## 2014-10-08 NOTE — Patient Instructions (Addendum)
Blood pressure above goal at 150/86 (top # high, bottom ok)  You wanted to work on diet/exercise and follow up with [place]. Check blood pressure 1-2 x a week. Goal 140/90. If we cannot reach this goal within a month-let me know and I will call in increased amlodipine. We will follow up in 3 months. We can update your bloodwork at that time too.   Diabetes continues to be held at [place].  Cholesterol looks great.   DASH Eating Plan DASH stands for "Dietary Approaches to Stop Hypertension." The DASH eating plan is a healthy eating plan that has been shown to reduce high blood pressure (hypertension). Additional health benefits may include reducing the risk of type 2 diabetes mellitus, heart disease, and stroke. The DASH eating plan may also help with weight loss. WHAT DO I NEED TO KNOW ABOUT THE DASH EATING PLAN? For the DASH eating plan, you will follow these general guidelines:  Choose foods with a percent daily value for sodium of less than 5% (as listed on the food label).  Use salt-free seasonings or herbs instead of table salt or sea salt.  Check with your health care provider or pharmacist before using salt substitutes.  Eat lower-sodium products, often labeled as "lower sodium" or "no salt added."  Eat fresh foods.  Eat more vegetables, fruits, and low-fat dairy products.  Choose whole grains. Look for the word "whole" as the first word in the ingredient list.  Choose fish and skinless chicken or [place] more often than red meat. Limit fish, poultry, and meat to 6 oz (170 g) each day.  Limit sweets, desserts, sugars, and sugary drinks.  Choose heart-healthy fats.  Limit cheese to 1 oz (28 g) per day.  Eat more home-cooked food and less restaurant, buffet, and fast food.  Limit fried foods.  Cook foods using methods other than frying.  Limit canned vegetables. If you do use them, rinse them well to decrease the sodium.  When eating at a restaurant, ask that your food be  prepared with less salt, or no salt if possible. WHAT FOODS CAN I EAT? Seek help from a dietitian for individual calorie needs. Grains Whole grain or whole wheat bread. Brown rice. Whole grain or whole wheat pasta. Quinoa, bulgur, and whole grain cereals. Low-sodium cereals. Corn or whole wheat flour tortillas. Whole grain cornbread. Whole grain crackers. Low-sodium crackers. Vegetables Fresh or frozen vegetables (raw, steamed, roasted, or grilled). Low-sodium or reduced-sodium tomato and vegetable juices. Low-sodium or reduced-sodium tomato sauce and paste. Low-sodium or reduced-sodium canned vegetables.  Fruits All fresh, canned (in natural juice), or frozen fruits. Meat and Other Protein Products Ground beef (85% or leaner), grass-fed beef, or beef trimmed of fat. Skinless chicken or [place]. Ground chicken or [place]. Pork trimmed of fat. All fish and seafood. Eggs. Dried beans, peas, or lentils. Unsalted nuts and seeds. Unsalted canned beans. Dairy Low-fat dairy products, such as skim or 1% milk, 2% or reduced-fat cheeses, low-fat ricotta or cottage cheese, or plain low-fat yogurt. Low-sodium or reduced-sodium cheeses. Fats and Oils Tub margarines without trans fats. Light or reduced-fat mayonnaise and salad dressings (reduced sodium). Avocado. Safflower, olive, or canola oils. Natural peanut or almond butter. Other Unsalted popcorn and pretzels. The items listed above may not be a complete list of recommended foods or beverages. Contact your dietitian for more options. WHAT FOODS ARE NOT RECOMMENDED? Grains White bread. White pasta. White rice. Refined cornbread. Bagels and croissants. Crackers that contain trans fat. Vegetables Creamed or  fried vegetables. Vegetables in a cheese sauce. Regular canned vegetables. Regular canned tomato sauce and paste. Regular tomato and vegetable juices. Fruits Dried fruits. Canned fruit in light or heavy syrup. Fruit juice. Meat and Other Protein  Products Fatty cuts of meat. Ribs, chicken wings, bacon, sausage, bologna, salami, chitterlings, fatback, hot dogs, bratwurst, and packaged luncheon meats. Salted nuts and seeds. Canned beans with salt. Dairy Whole or 2% milk, cream, half-and-half, and cream cheese. Whole-fat or sweetened yogurt. Full-fat cheeses or blue cheese. Nondairy creamers and whipped toppings. Processed cheese, cheese spreads, or cheese curds. Condiments Onion and garlic salt, seasoned salt, table salt, and sea salt. Canned and packaged gravies. Worcestershire sauce. Tartar sauce. Barbecue sauce. Teriyaki sauce. Soy sauce, including reduced sodium. Steak sauce. Fish sauce. Oyster sauce. Cocktail sauce. Horseradish. Ketchup and mustard. Meat flavorings and tenderizers. Bouillon cubes. Hot sauce. Tabasco sauce. Marinades. Taco seasonings. Relishes. Fats and Oils Butter, stick margarine, lard, shortening, ghee, and bacon fat. Coconut, palm kernel, or palm oils. Regular salad dressings. Other Pickles and olives. Salted popcorn and pretzels. The items listed above may not be a complete list of foods and beverages to avoid. Contact your dietitian for more information. WHERE CAN I FIND MORE INFORMATION? National Heart, Lung, and Blood Institute: travelstabloid.com Document Released: 11/03/2011 Document Revised: 03/31/2014 Document Reviewed: 09/18/2013 [facility] Patient Information 2015 [place], [place]. This information is not intended to replace advice given to you by your health care provider. Make sure you discuss any questions you have with your health care provider.

## 2014-11-22 ENCOUNTER — Other Ambulatory Visit: Payer: Self-pay | Admitting: Internal Medicine

## 2015-01-09 ENCOUNTER — Encounter: Payer: Self-pay | Admitting: Family Medicine

## 2015-01-09 ENCOUNTER — Ambulatory Visit (INDEPENDENT_AMBULATORY_CARE_PROVIDER_SITE_OTHER): Payer: 59 | Admitting: Family Medicine

## 2015-01-09 VITALS — BP 124/84 | HR 68 | Temp 98.0°F | Wt 168.0 lb

## 2015-01-09 DIAGNOSIS — I10 Essential (primary) hypertension: Secondary | ICD-10-CM

## 2015-01-09 DIAGNOSIS — E119 Type 2 diabetes mellitus without complications: Secondary | ICD-10-CM

## 2015-01-09 DIAGNOSIS — E782 Mixed hyperlipidemia: Secondary | ICD-10-CM

## 2015-01-09 LAB — COMPREHENSIVE METABOLIC PANEL
ALBUMIN: 4.4 g/dL (ref 3.5–5.2)
ALT: 29 U/L (ref 0–53)
AST: 23 U/L (ref 0–37)
Alkaline Phosphatase: 113 U/L (ref 39–117)
BUN: 18 mg/dL (ref 6–23)
CALCIUM: 9.6 mg/dL (ref 8.4–10.5)
CHLORIDE: 103 meq/L (ref 96–112)
CO2: 30 mEq/L (ref 19–32)
Creatinine, Ser: 0.84 mg/dL (ref 0.40–1.50)
GFR: 100.53 mL/min (ref >60.00)
GLUCOSE: 81 mg/dL (ref 70–99)
POTASSIUM: 4.1 meq/L (ref 3.5–5.1)
SODIUM: 138 meq/L (ref 135–145)
TOTAL PROTEIN: 8.4 g/dL — AB (ref 6.0–8.3)
Total Bilirubin: 0.9 mg/dL (ref 0.2–1.2)

## 2015-01-09 LAB — LIPID PANEL
Cholesterol: 108 mg/dL (ref 0–200)
HDL: 33.4 mg/dL — ABNORMAL LOW (ref >39.00)
LDL Cholesterol: 56 mg/dL (ref 0–99)
NonHDL: 74.6
Total CHOL/HDL Ratio: 3
Triglycerides: 95 mg/dL (ref 0.0–149.0)
VLDL: 19 mg/dL (ref 0.0–40.0)

## 2015-01-09 LAB — HEMOGLOBIN A1C: Hgb A1c MFr Bld: 6.1 % (ref 4.6–6.5)

## 2015-01-09 MED ORDER — AMLODIPINE BESYLATE 5 MG PO TABS: 5.0000 mg | ORAL_TABLET | Freq: Every day | ORAL | Status: DC

## 2015-01-09 MED ORDER — LISINOPRIL 40 MG PO TABS: 40.0000 mg | ORAL_TABLET | Freq: Every day | ORAL | Status: DC

## 2015-01-09 MED ORDER — ATORVASTATIN CALCIUM 40 MG PO TABS: 40.0000 mg | ORAL_TABLET | Freq: Every day | ORAL | Status: DC

## 2015-01-09 NOTE — Assessment & Plan Note (Signed)
Controlled on Atorvastatin 40mg . Check lipids today

## 2015-01-09 NOTE — Progress Notes (Signed)
Garret Reddish, MD Phone: [phone]  Subjective:   Alex [name] is a 54 y.o. year old very pleasant male patient who presents with the following:  Hypertension-amlodipine 5mg  and lisinopril 40mg  BP Readings from Last 3 Encounters:  01/09/15 124/84  10/08/14 150/86  05/12/14 130/75  walking 4.5 miles 3x a week at work.  Home BP monitoring under 130 under 85  Compliant with medications-yes without side effects ROS-Denies any CP, HA, SOB, blurry vision, LE edema.   Hyperlipidemia-controlled previously  Lab Results  Component Value Date   LDLCALC 56 06/27/2014   On statin: yes Regular exercise: yes as above Diet: some poor choices recently and weight gain ROS- no chest pain or shortness of breath. No myalgias  Diabetes II -diet and exercise controlled ROS-no hypoglycemia or blurry vision  Past Medical History- Patient Active Problem List   Diagnosis Date Noted  . Diabetes mellitus type II, controlled 11/11/2010    Priority: High  . HYPERLIPIDEMIA 12/17/2007    Priority: Medium  . Essential hypertension 12/17/2007    Priority: Medium  . Situational anxiety 07/22/2010    Priority: Low   Medications- reviewed and updated Current Outpatient Prescriptions  Medication Sig Dispense Refill  . amLODipine (NORVASC) 5 MG tablet Take 1 tablet (5 mg total) by mouth daily. 90 tablet 3  . atorvastatin (LIPITOR) 40 MG tablet Take 1 tablet (40 mg total) by mouth daily. 90 tablet 3  . Coenzyme Q10 150 MG CAPS Take 150 capsules by mouth daily.      Marland Kitchen glucose blood test strip 1 each by Other route daily. Use as instructed 100 each 12  . Lancet Devices (ACCU-CHEK SOFTCLIX) lancets 1 each by Other route daily. Use as instructed 1 each 11  . lisinopril (PRINIVIL,ZESTRIL) 40 MG tablet Take 1 tablet (40 mg total) by mouth daily. 90 tablet 3  . Multiple Vitamin (MULTIVITAMIN) tablet Take 1 tablet by mouth daily.    . Omega-3 Fatty Acids (RA FISH OIL) 1000 MG CAPS Take by mouth.      .  vitamin B-12 (CYANOCOBALAMIN) 500 MCG tablet Take 500 mcg by mouth daily.       No current facility-administered medications for this visit.    Objective: BP 124/84 mmHg  Pulse 68  Temp(Src) 98 F (36.7 C) (Oral)  Wt 168 lb (76.204 kg) Gen: NAD, resting comfortably CV: RRR no murmurs rubs or gallops Lungs: CTAB no crackles, wheeze, rhonchi Abdomen: soft/nontender/nondistended/normal bowel sounds.  Ext: no edema Skin: warm, dry, no rash  Neuro: grossly normal, moves all extremities, normal gait   Assessment/Plan:  Diabetes mellitus type II, controlled Diet and exercise controlled. Check a1c today. Goal <6.5   HYPERLIPIDEMIA Controlled on Atorvastatin 40mg . Check lipids today   Essential hypertension Controlled on Amlodipine 5mg , lisinopril 40mg  (now that he knows to take them both). Weight trending up. We discussed weight goal to help with all 3 current medical problems.     Return precautions advised. 6-9 month CPE follow up.   Orders Placed This Encounter  Procedures  . Comprehensive metabolic panel    [place]    Order Specific Question:  Has the patient fasted?    Answer:  No  . Hemoglobin A1c    [place]  . Lipid panel    [place]    Order Specific Question:  Has the patient fasted?    Answer:  No    Meds ordered this encounter  Medications  . amLODipine (NORVASC) 5 MG tablet    Sig: Take  1 tablet (5 mg total) by mouth daily.    Dispense:  90 tablet    Refill:  3  . atorvastatin (LIPITOR) 40 MG tablet    Sig: Take 1 tablet (40 mg total) by mouth daily.    Dispense:  90 tablet    Refill:  3  . lisinopril (PRINIVIL,ZESTRIL) 40 MG tablet    Sig: Take 1 tablet (40 mg total) by mouth daily.    Dispense:  90 tablet    Refill:  3

## 2015-01-09 NOTE — Patient Instructions (Signed)
Blood pressure looks great on both meds. No changes.   Keep shooting for a weight around 150.   See you in 6-9 months  Update labs today

## 2015-01-09 NOTE — Assessment & Plan Note (Signed)
Diet and exercise controlled. Check a1c today. Goal <6.5

## 2015-01-09 NOTE — Progress Notes (Signed)
Pre visit review using our clinic review tool, if applicable. No additional management support is needed unless otherwise documented below in the visit note. 

## 2015-01-09 NOTE — Assessment & Plan Note (Signed)
Controlled on Amlodipine 5mg , lisinopril 40mg  (now that he knows to take them both). Weight trending up. We discussed weight goal to help with all 3 current medical problems.

## 2015-07-24 ENCOUNTER — Encounter: Payer: Self-pay | Admitting: Family Medicine

## 2015-07-24 ENCOUNTER — Ambulatory Visit (INDEPENDENT_AMBULATORY_CARE_PROVIDER_SITE_OTHER): Payer: 59 | Admitting: Family Medicine

## 2015-07-24 VITALS — BP 142/86 | HR 88 | Temp 98.4°F | Wt 168.0 lb

## 2015-07-24 DIAGNOSIS — E782 Mixed hyperlipidemia: Secondary | ICD-10-CM

## 2015-07-24 DIAGNOSIS — I10 Essential (primary) hypertension: Secondary | ICD-10-CM

## 2015-07-24 DIAGNOSIS — E119 Type 2 diabetes mellitus without complications: Secondary | ICD-10-CM

## 2015-07-24 LAB — HEMOGLOBIN A1C: HEMOGLOBIN A1C: 5.5 % (ref 4.6–6.5)

## 2015-07-24 MED ORDER — LISINOPRIL 40 MG PO TABS: 40.0000 mg | ORAL_TABLET | Freq: Every day | ORAL | Status: DC

## 2015-07-24 MED ORDER — AMLODIPINE BESYLATE 5 MG PO TABS: 5.0000 mg | ORAL_TABLET | Freq: Every day | ORAL | Status: DC

## 2015-07-24 MED ORDER — ATORVASTATIN CALCIUM 40 MG PO TABS: 40.0000 mg | ORAL_TABLET | Freq: Every day | ORAL | Status: DC

## 2015-07-24 MED ORDER — GLUCOSE BLOOD VI STRP: ORAL_STRIP | Status: DC

## 2015-07-24 NOTE — Patient Instructions (Addendum)
Medication Instructions:  No changes  Other Instructions:  Need to get back into exercise. BP up a hair- no adjustments for now as you work towards healthier lifestyle.   Have a copy of eye exam sent to [place]  Labwork: a1c before you go  Testing/Procedures/Immunizations: Flu shot through work  Follow-Up (all visit scheduling, rescheduling, cancellations including labs should be scheduled at front desk): Physical in 6 months with labs and visit after 01/09/15  Sooner if you need [place]

## 2015-07-24 NOTE — Progress Notes (Signed)
Garret Reddish, MD  Subjective:  Alex [name] is a 54 y.o. year old very pleasant male patient who presents with:  See problem oriented charting- ROS- no chest pain, shortness of breath, hypoglycemia, nausea, vomiting, blurry vision. No myalgias.   Past Medical History-  Patient Active Problem List   Diagnosis Date Noted  . Diabetes mellitus type II, controlled 11/11/2010    Priority: High  . HYPERLIPIDEMIA 12/17/2007    Priority: Medium  . Essential hypertension 12/17/2007    Priority: Medium  . Situational anxiety 07/22/2010    Priority: Low   Medications- reviewed and updated Current Outpatient Prescriptions  Medication Sig Dispense Refill  . amLODipine (NORVASC) 5 MG tablet Take 1 tablet (5 mg total) by mouth daily. 90 tablet 3  . atorvastatin (LIPITOR) 40 MG tablet Take 1 tablet (40 mg total) by mouth daily. 90 tablet 3  . Coenzyme Q10 150 MG CAPS Take 150 capsules by mouth daily.      Marland Kitchen glucose blood test strip 1 each by Other route daily. Use as instructed 100 each 12  . Lancet Devices (ACCU-CHEK SOFTCLIX) lancets 1 each by Other route daily. Use as instructed 1 each 11  . lisinopril (PRINIVIL,ZESTRIL) 40 MG tablet Take 1 tablet (40 mg total) by mouth daily. 90 tablet 3  . Multiple Vitamin (MULTIVITAMIN) tablet Take 1 tablet by mouth daily.    . Omega-3 Fatty Acids (RA FISH OIL) 1000 MG CAPS Take by mouth.      . vitamin B-12 (CYANOCOBALAMIN) 500 MCG tablet Take 500 mcg by mouth daily.       Objective: BP 142/86 mmHg  Pulse 88  Temp(Src) 98.4 F (36.9 C)  Wt 168 lb (76.204 kg) Gen: NAD, resting comfortably CV: RRR no murmurs rubs or gallops Lungs: CTAB no crackles, wheeze, rhonchi Abdomen: soft/nontender/nondistended/normal bowel sounds. No rebound or guarding.  Ext: no edema Skin: warm, dry Neuro: grossly normal, moves all extremities   Diabetic Foot Exam - Simple   Simple Foot Form  Diabetic Foot exam was performed with the following findings:  Yes  07/24/2015  2:53 PM  Visual Inspection  No deformities, no ulcerations, no other skin breakdown bilaterally:  Yes  Sensation Testing  Intact to touch and monofilament testing bilaterally:  Yes  Pulse Check  Posterior Tibialis and Dorsalis pulse intact bilaterally:  Yes  Comments     Assessment/Plan:  Diabetes mellitus type II, controlled S: controlled previously. Exercise lacking. Working on portion size. Has lost 3 lbs but must have gained at some point.  A/P: get back on exercise, check a1c to make sure <6.5 which is goal for patient.    HYPERLIPIDEMIA S: controlled. LDL <70 A/P:Continue current meds:  Atorvastatin 40mg , lipids at CPE    Essential hypertension S: mild poorly controlled. SBP has crept above 140.  A/P:Continue current meds:  Amlodipine 5mg , lisinopril 40mg . Exercise lacking so we will add that back-if continued issues at follow up increase amlodipine to 10mg .    6 months CPE  Orders Placed This Encounter  Procedures  . Hemoglobin A1c   Meds ordered this encounter  Medications  . glucose blood (ACCU-CHEK AVIVA) test strip    Sig: Check blood sugar daily as needed.    Dispense:  100 each    Refill:  3  . lisinopril (PRINIVIL,ZESTRIL) 40 MG tablet    Sig: Take 1 tablet (40 mg total) by mouth daily.    Dispense:  90 tablet    Refill:  3  .  atorvastatin (LIPITOR) 40 MG tablet    Sig: Take 1 tablet (40 mg total) by mouth daily.    Dispense:  90 tablet    Refill:  3  . amLODipine (NORVASC) 5 MG tablet    Sig: Take 1 tablet (5 mg total) by mouth daily.    Dispense:  90 tablet    Refill:  3

## 2015-07-24 NOTE — Assessment & Plan Note (Signed)
S: mild poorly controlled. SBP has crept above 140.  A/P:Continue current meds:  Amlodipine 5mg , lisinopril 40mg . Exercise lacking so we will add that back-if continued issues at follow up increase amlodipine to 10mg .

## 2015-07-24 NOTE — Assessment & Plan Note (Signed)
S: controlled previously. Exercise lacking. Working on portion size. Has lost 3 lbs but must have gained at some point.  A/P: get back on exercise, check a1c to make sure <6.5 which is goal for patient.

## 2015-07-24 NOTE — Assessment & Plan Note (Signed)
S: controlled. LDL <70 A/P:Continue current meds:  Atorvastatin 40mg , lipids at CPE

## 2016-01-18 ENCOUNTER — Other Ambulatory Visit (INDEPENDENT_AMBULATORY_CARE_PROVIDER_SITE_OTHER): Payer: 59

## 2016-01-18 DIAGNOSIS — Z0000 Encounter for general adult medical examination without abnormal findings: Secondary | ICD-10-CM

## 2016-01-18 DIAGNOSIS — R7989 Other specified abnormal findings of blood chemistry: Secondary | ICD-10-CM

## 2016-01-18 LAB — POC URINALSYSI DIPSTICK (AUTOMATED)
Bilirubin, UA: NEGATIVE
Blood, UA: NEGATIVE
Glucose, UA: NEGATIVE
KETONES UA: NEGATIVE
LEUKOCYTES UA: NEGATIVE
Nitrite, UA: NEGATIVE
PROTEIN UA: NEGATIVE
Spec Grav, UA: 1.02
Urobilinogen, UA: 0.2
pH, UA: 7

## 2016-01-18 LAB — CBC WITH DIFFERENTIAL/PLATELET
Basophils Absolute: 0 10*3/uL (ref 0.0–0.1)
Basophils Relative: 0.4 % (ref 0.0–3.0)
Eosinophils Absolute: 0.3 10*3/uL (ref 0.0–0.7)
Eosinophils Relative: 3.1 % (ref 0.0–5.0)
HCT: 41.7 % (ref 39.0–52.0)
Hemoglobin: 14.3 g/dL (ref 13.0–17.0)
Lymphocytes Relative: 22.9 % (ref 12.0–46.0)
Lymphs Abs: 2.2 10*3/uL (ref 0.7–4.0)
MCHC: 34.2 g/dL (ref 30.0–36.0)
MCV: 86.4 fl (ref 78.0–100.0)
Monocytes Absolute: 0.8 10*3/uL (ref 0.1–1.0)
Monocytes Relative: 8.5 % (ref 3.0–12.0)
Neutro Abs: 6.2 10*3/uL (ref 1.4–7.7)
Neutrophils Relative %: 65.1 % (ref 43.0–77.0)
Platelets: 297 10*3/uL (ref 150.0–400.0)
RBC: 4.83 Mil/uL (ref 4.22–5.81)
RDW: 12.5 % (ref 11.5–15.5)
WBC: 9.4 10*3/uL (ref 4.0–10.5)

## 2016-01-18 LAB — HEPATIC FUNCTION PANEL
ALK PHOS: 85 U/L (ref 39–117)
ALT: 27 U/L (ref 0–53)
AST: 27 U/L (ref 0–37)
Albumin: 4.6 g/dL (ref 3.5–5.2)
BILIRUBIN DIRECT: 0.1 mg/dL (ref 0.0–0.3)
TOTAL PROTEIN: 7.6 g/dL (ref 6.0–8.3)
Total Bilirubin: 0.7 mg/dL (ref 0.2–1.2)

## 2016-01-18 LAB — LIPID PANEL
CHOLESTEROL: 225 mg/dL — AB (ref 0–200)
HDL: 36.6 mg/dL — AB (ref >39.00)
NonHDL: 188.82
TRIGLYCERIDES: 235 mg/dL — AB (ref 0.0–149.0)
Total CHOL/HDL Ratio: 6
VLDL: 47 mg/dL — AB (ref 0.0–40.0)

## 2016-01-18 LAB — BASIC METABOLIC PANEL
BUN: 18 mg/dL (ref 6–23)
CO2: 30 mEq/L (ref 19–32)
Calcium: 10.1 mg/dL (ref 8.4–10.5)
Chloride: 103 mEq/L (ref 96–112)
Creatinine, Ser: 0.87 mg/dL (ref 0.40–1.50)
GFR: 96.19 mL/min (ref >60.00)
Glucose, Bld: 98 mg/dL (ref 70–99)
Potassium: 5 mEq/L (ref 3.5–5.1)
Sodium: 140 mEq/L (ref 135–145)

## 2016-01-18 LAB — PSA: PSA: 0.92 ng/mL (ref 0.10–4.00)

## 2016-01-18 LAB — LDL CHOLESTEROL, DIRECT: Direct LDL: 137 mg/dL

## 2016-01-18 LAB — TSH: TSH: 2.32 u[IU]/mL (ref 0.35–4.50)

## 2016-01-18 LAB — HEMOGLOBIN A1C: HEMOGLOBIN A1C: 5.8 % (ref 4.6–6.5)

## 2016-01-25 ENCOUNTER — Encounter: Payer: Self-pay | Admitting: Family Medicine

## 2016-01-25 ENCOUNTER — Ambulatory Visit (INDEPENDENT_AMBULATORY_CARE_PROVIDER_SITE_OTHER): Payer: 59 | Admitting: Family Medicine

## 2016-01-25 VITALS — BP 148/80 | HR 89 | Temp 98.4°F | Ht 64.0 in | Wt 172.0 lb

## 2016-01-25 DIAGNOSIS — Z0001 Encounter for general adult medical examination with abnormal findings: Secondary | ICD-10-CM

## 2016-01-25 DIAGNOSIS — M545 Low back pain, unspecified: Secondary | ICD-10-CM

## 2016-01-25 DIAGNOSIS — I10 Essential (primary) hypertension: Secondary | ICD-10-CM

## 2016-01-25 DIAGNOSIS — E782 Mixed hyperlipidemia: Secondary | ICD-10-CM | POA: Diagnosis not present

## 2016-01-25 DIAGNOSIS — E119 Type 2 diabetes mellitus without complications: Secondary | ICD-10-CM | POA: Diagnosis not present

## 2016-01-25 DIAGNOSIS — Z23 Encounter for immunization: Secondary | ICD-10-CM

## 2016-01-25 DIAGNOSIS — R6889 Other general symptoms and signs: Secondary | ICD-10-CM

## 2016-01-25 NOTE — Assessment & Plan Note (Addendum)
S:Atorvastatin 40mg - severe myalgias on daily . Very poor control off medicine completely A/P: trial once weekly atorvastatoin

## 2016-01-25 NOTE — Patient Instructions (Addendum)
Have eye exam report sent to [place] at [phone]. Sign release of information at the check out desk for eye records.   [facility] [place]  Let's trial the atorvastatin just once a week- message me if you cannot tolerate this.   Your blood pressure trend concerns me. I would like for you to use a home cuff to check at least once a week. Your goal is <140/90. If you note in the next few weeks that it is higher than our goal, see me sooner. Otherwise, see me at regular follow up. Bring your home cuff and your log of blood pressures with you to visit.

## 2016-01-25 NOTE — Assessment & Plan Note (Signed)
S:poor control on amlodipine 5 and lisinopril 40 nightly- A/P: advised home monitoring- if above goal at home- increase to 10mg  amlodipine

## 2016-01-25 NOTE — Progress Notes (Signed)
Alex Reddish, MD Phone: [phone]  Subjective:  Patient presents today for their annual physical. Chief complaint-noted.   See problem oriented charting- ROS- full  review of systems was completed and negative including No chest pain or shortness of breath. No headache or blurry vision. No hypoglycemia  The following were reviewed and entered/updated in epic: Past Medical History  Diagnosis Date  . Nonspecific elevation of levels of transaminase or lactic acid dehydrogenase (LDH)   . Mixed hyperlipidemia   . Unspecified essential hypertension   . Diabetes mellitus    Patient Active Problem List   Diagnosis Date Noted  . Diabetes mellitus type II, controlled ([place]) 11/11/2010    Priority: High  . HYPERLIPIDEMIA 12/17/2007    Priority: Medium  . Essential hypertension 12/17/2007    Priority: Medium  . Situational anxiety 07/22/2010    Priority: Low   Past Surgical History  Procedure Laterality Date  . Appendectomy      Family History  Problem Relation Age of Onset  . COPD Mother   . Emphysema Mother   . Heart disease Father     heavy smoker, 68  . Coronary artery disease Father   . Heart attack Father     Medications- reviewed and updated Current Outpatient Prescriptions  Medication Sig Dispense Refill  . amLODipine (NORVASC) 5 MG tablet Take 1 tablet (5 mg total) by mouth daily. 90 tablet 3  . atorvastatin (LIPITOR) 40 MG tablet Take 1 tablet (40 mg total) by mouth daily. 90 tablet 3  . Coenzyme Q10 150 MG CAPS Take 150 capsules by mouth daily.      Marland Kitchen glucose blood (ACCU-CHEK AVIVA) test strip Check blood sugar daily as needed. 100 each 3  . glucose blood test strip 1 each by Other route daily. Use as instructed 100 each 12  . Lancet Devices (ACCU-CHEK SOFTCLIX) lancets 1 each by Other route daily. Use as instructed 1 each 11  . lisinopril (PRINIVIL,ZESTRIL) 40 MG tablet Take 1 tablet (40 mg total) by mouth daily. 90 tablet 3  . Multiple Vitamin  (MULTIVITAMIN) tablet Take 1 tablet by mouth daily.    . Omega-3 Fatty Acids (RA FISH OIL) 1000 MG CAPS Take by mouth.      . vitamin B-12 (CYANOCOBALAMIN) 500 MCG tablet Take 500 mcg by mouth daily.       No current facility-administered medications for this visit.    Allergies-reviewed and updated No Known Allergies  Social History   Social History  . Marital Status: Married    Spouse Name: N/A  . Number of Children: N/A  . Years of Education: N/A   Social History Main Topics  . Smoking status: Never Smoker   . Smokeless tobacco: None  . Alcohol Use: 0.6 oz/week    1 Standard drinks or equivalent per week  . Drug Use: No  . Sexual Activity: Not Asked   Other Topics Concern  . None   Social History Narrative   Married 2007. No kids. 3 yorkies.       [employer]- assembly work. 5 days a week now.    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 Oropharynx normal Neck: no thyromegaly CV: RRR no murmurs rubs or gallops Lungs: CTAB no crackles, wheeze, rhonchi Abdomen: soft/nontender/nondistended/normal bowel sounds. No rebound or guarding.   Back - Normal skin, Spine with normal alignment and no deformity.  No tenderness to vertebral process palpation.  Paraspinous muscles are not tender and without spasm. Some mild pain near SI joint.   Range of motion is full at neck and lumbar sacral regions. Negative Straight leg raise.  Neuro- no saddle anesthesia, 5/5 strength lower extremities, 2+ reflexes  Rectal: normal tone, normal size prostate, no masses or tenderness  Ext: no edema Skin: warm, dry  Assessment/Plan:  55 y.o. male presenting for  annual physical.  Health Maintenance counseling: 1. Anticipatory guidance: Patient counseled regarding regular dental exams, eye exams, wearing seatbelts.  2. Risk factor reduction:  Advised patient of need for regular exercise and diet rich and fruits and vegetables to reduce risk of heart attack and stroke.  3. Immunizations/screenings/ancillary studies Health Maintenance Due  Topic Date Due  . OPHTHALMOLOGY EXAM - ROI to be signed 04/29/2015  . INFLUENZA VACCINE - declined this year 06/29/2015   4. Prostate cancer screening- low risk based off PSA and rectal exam   Lab Results  Component Value Date   PSA 0.92 01/18/2016   PSA 0.86 05/13/2011   5. Colon cancer screening - 11/28/2006 with 10 year repeat  DIabetes-  S:controlled with diet/exercise without meds A/P: a1c creeping up from 5.5 to 5.8 with weight creep- advised to reverse trend  Low Back pain- for around a year. Chiropractor helps for a few days then worsens. Right low back- some pain into buttocks. Doing some stretching in the morning. Taking some aleve.   R elbow pain. A few weeks- will check back with chiropractor. Consider ortho if continued issues  HYPERLIPIDEMIA S:Atorvastatin 40mg - severe myalgias on daily . Very poor control off medicine completely A/P: trial once weekly atorvastatoin   Essential hypertension S:poor control on amlodipine 5 and lisinopril 40 nightly- A/P: advised home monitoring- if above goal at home- increase to 10mg  amlodipine     Return in about 6 months (around 07/24/2016) for unless blood pressure >140/90 at home. Return precautions advised.   Orders Placed This Encounter  Procedures  . DG Lumbar Spine Complete    Standing Status: Future     Number of Occurrences:      Standing Expiration Date: 03/24/2017    Order Specific Question:  Reason for Exam (SYMPTOM  OR DIAGNOSIS REQUIRED)    Answer:  low back pain since august 2016    Order Specific Question:  Preferred imaging location?      Answer:  [facility]

## 2016-02-04 ENCOUNTER — Ambulatory Visit (INDEPENDENT_AMBULATORY_CARE_PROVIDER_SITE_OTHER)
Admission: RE | Admit: 2016-02-04 | Discharge: 2016-02-04 | Disposition: A | Discharge status: Home / Self Care | Payer: 59 | Source: Ambulatory Visit | Attending: Family Medicine | Admitting: Family Medicine

## 2016-02-04 DIAGNOSIS — M545 Low back pain, unspecified: Secondary | ICD-10-CM

## 2016-02-11 ENCOUNTER — Telehealth: Payer: Self-pay | Admitting: Family Medicine

## 2016-02-11 NOTE — Telephone Encounter (Signed)
Was this result sent to mychart? I see where you commented but cant tell if it was sent.

## 2016-02-11 NOTE — Telephone Encounter (Signed)
Returned pt call and notified him of what Dr. Yong Channel resulted.

## 2016-02-11 NOTE — Telephone Encounter (Signed)
When I send the result note- it releases it. You can verbally read it to him- my comments

## 2016-02-11 NOTE — Telephone Encounter (Signed)
Pt would like x-ray result

## 2016-03-25 ENCOUNTER — Telehealth: Payer: Self-pay | Admitting: Family Medicine

## 2016-03-25 NOTE — Telephone Encounter (Signed)
lmovm for pt to call and make an appt to discuss meds

## 2016-04-11 ENCOUNTER — Ambulatory Visit (INDEPENDENT_AMBULATORY_CARE_PROVIDER_SITE_OTHER): Payer: 59 | Admitting: Family Medicine

## 2016-04-11 ENCOUNTER — Encounter: Payer: Self-pay | Admitting: Family Medicine

## 2016-04-11 VITALS — BP 130/80 | HR 80 | Temp 98.8°F | Ht 64.0 in | Wt 171.0 lb

## 2016-04-11 DIAGNOSIS — I10 Essential (primary) hypertension: Secondary | ICD-10-CM | POA: Diagnosis not present

## 2016-04-11 MED ORDER — HYDROCHLOROTHIAZIDE 25 MG PO TABS: 25.0000 mg | ORAL_TABLET | Freq: Every day | ORAL | Status: DC

## 2016-04-11 NOTE — Progress Notes (Signed)
Subjective:  Alex [name] is a 55 y.o. year old very pleasant male patient who presents for/with See problem oriented charting ROS- No chest pain or shortness of breath. No headache or blurry vision.see any ROS included in HPI as well.   Past Medical History-  Patient Active Problem List   Diagnosis Date Noted  . Diabetes mellitus type II, controlled ([place]) 11/11/2010    Priority: High  . HYPERLIPIDEMIA 12/17/2007    Priority: Medium  . Essential hypertension 12/17/2007    Priority: Medium  . Situational anxiety 07/22/2010    Priority: Low    Medications- reviewed and updated Current Outpatient Prescriptions  Medication Sig Dispense Refill  . atorvastatin (LIPITOR) 40 MG tablet Take 1 tablet (40 mg total) by mouth daily. 90 tablet 3  . Coenzyme Q10 150 MG CAPS Take 150 capsules by mouth daily.      Marland Kitchen glucose blood (ACCU-CHEK AVIVA) test strip Check blood sugar daily as needed. 100 each 3  . glucose blood test strip 1 each by Other route daily. Use as instructed 100 each 12  . Lancet Devices (ACCU-CHEK SOFTCLIX) lancets 1 each by Other route daily. Use as instructed 1 each 11  . lisinopril (PRINIVIL,ZESTRIL) 40 MG tablet Take 1 tablet (40 mg total) by mouth daily. 90 tablet 3  . Multiple Vitamin (MULTIVITAMIN) tablet Take 1 tablet by mouth daily.    . Omega-3 Fatty Acids (RA FISH OIL) 1000 MG CAPS Take by mouth.      . vitamin B-12 (CYANOCOBALAMIN) 500 MCG tablet Take 500 mcg by mouth daily.      . hydrochlorothiazide (HYDRODIURIL) 25 MG tablet Take 1 tablet (25 mg total) by mouth daily. 30 tablet 5   No current facility-administered medications for this visit.    Objective: BP 130/80 mmHg  Pulse 80  Temp(Src) 98.8 F (37.1 C) (Oral)  Ht 5\' 4"  (1.626 m)  Wt 171 lb (77.565 kg)  BMI 29.34 kg/m2 Gen: NAD, resting comfortably CV: RRR no murmurs rubs or gallops Lungs: CTAB no crackles, wheeze, rhonchi Abdomen: soft/nontender/nondistended/normal bowel sounds. No rebound or  guarding.  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 Left arm appears to haveblood pressure about 15-25 points higher than left consistently though. He is going to do home monitoring of both and follow up with these numbers, consider cardiology consult- could be a sign of peripheral arterial disease ?   Return in about 4 weeks (around 05/09/2016). Return precautions advised.   Meds ordered this encounter  Medications  . hydrochlorothiazide (HYDRODIURIL) 25 MG tablet    Sig: Take 1 tablet (25 mg total) by mouth daily.    Dispense:  30 tablet    Refill:  5   Garret Reddish, MD

## 2016-04-11 NOTE — Assessment & Plan Note (Signed)
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 Left arm appears to haveblood pressure about 15-25 points higher than left consistently though. He is going to do home monitoring of both and follow up with these numbers, consider cardiology consult- could be a sign of peripheral arterial disease ?

## 2016-04-11 NOTE — Patient Instructions (Addendum)
Blood pressure overall looks great Concern about the amlodipine affecting your gums Reasonable to check both arms  Stop amlodipine Start hydrochlorothiazide 25mg   Continue lisinopril 40mg   Follow up in within a month or so  Home cuff accurate on several checks today. Left arm appears to haveblood pressure about 15-25 points higher than left consistently though. He is going to do home monitoring of both and follow up with these numbers

## 2016-04-18 ENCOUNTER — Telehealth: Payer: Self-pay | Admitting: Family Medicine

## 2016-04-18 NOTE — Telephone Encounter (Signed)
Pt states he is drinking more than 60 oz of water per day already.  Per Dr Yong Channel: go ahead and stop HCTZ today, continue fluids, and call back Wed or Thurs with BP reading. Pt voiced understanding.

## 2016-04-18 NOTE — Telephone Encounter (Signed)
I want him to push to get at least 60 oz of fluids a day then update me on Wednesday or thurdsday. Have him call in his blood pressures at that time. If side effects remain- consider stopping hctz and seeing what BP does off medication

## 2016-04-18 NOTE — Telephone Encounter (Signed)
Pt said he is having some side effects from this medicince  Dizzpyness, ,pulse rate high,  bp getting low   Would like a call back

## 2016-05-09 ENCOUNTER — Encounter: Payer: Self-pay | Admitting: Family Medicine

## 2016-05-09 ENCOUNTER — Ambulatory Visit (INDEPENDENT_AMBULATORY_CARE_PROVIDER_SITE_OTHER): Payer: 59 | Admitting: Family Medicine

## 2016-05-09 VITALS — BP 132/78 | HR 75 | Temp 98.9°F | Ht 64.0 in | Wt 170.0 lb

## 2016-05-09 DIAGNOSIS — I10 Essential (primary) hypertension: Secondary | ICD-10-CM | POA: Diagnosis not present

## 2016-05-09 NOTE — Patient Instructions (Signed)
Follow up early march for next physical with labs a few days before.   Goal 10-15 lbs down  Goal blood pressure <140/90. Keep an eye on this and see me sooner than physical if gets above goal.

## 2016-05-09 NOTE — Assessment & Plan Note (Signed)
S: controlled on lisinopril 40mg - with hctz 25mg  as recent addition (stopped amlodipine as well) but hctz had to be stopped due to BPs around 100s. Left arm was about 15-25 points higher on several checks last visit. Since stopping hctz- Bps have remained in 120s and low 130s over 70s and 80s BP Readings from Last 3 Encounters:  05/09/16 132/78  04/11/16 130/80  01/25/16 148/80  A/P:Continue current meds:  Lisinopril 40mg  alone. We discussed healthy lifestyle choices and potential to come off medication in future as he has in past. Goal 10-15 lbs down.  Last visit some concern about BP being differing higher in left arm. Today 130/80 left, 132/82 on right. Home readings vary and sometimes the right is higher than left though generally left slightly higher. No further follow up needed

## 2016-05-09 NOTE — Progress Notes (Signed)
Subjective:  Alex [name] is a 55 y.o. year old very pleasant male patient who presents for/with See problem oriented charting ROS- No chest pain or shortness of breath. No headache or blurry vision. .see any ROS included in HPI as well.   Past Medical History-  Patient Active Problem List   Diagnosis Date Noted  . Diabetes mellitus type II, controlled ([place]) 11/11/2010    Priority: High  . HYPERLIPIDEMIA 12/17/2007    Priority: Medium  . Essential hypertension 12/17/2007    Priority: Medium  . Situational anxiety 07/22/2010    Priority: Low    Medications- reviewed and updated Current Outpatient Prescriptions  Medication Sig Dispense Refill  . atorvastatin (LIPITOR) 40 MG tablet Take 1 tablet (40 mg total) by mouth daily. 90 tablet 3  . Coenzyme Q10 150 MG CAPS Take 150 capsules by mouth daily.      Marland Kitchen glucose blood (ACCU-CHEK AVIVA) test strip Check blood sugar daily as needed. 100 each 3  . glucose blood test strip 1 each by Other route daily. Use as instructed 100 each 12  . Lancet Devices (ACCU-CHEK SOFTCLIX) lancets 1 each by Other route daily. Use as instructed 1 each 11  . lisinopril (PRINIVIL,ZESTRIL) 40 MG tablet Take 1 tablet (40 mg total) by mouth daily. 90 tablet 3  . Multiple Vitamin (MULTIVITAMIN) tablet Take 1 tablet by mouth daily.    . Omega-3 Fatty Acids (RA FISH OIL) 1000 MG CAPS Take by mouth.      . vitamin B-12 (CYANOCOBALAMIN) 500 MCG tablet Take 500 mcg by mouth daily.       No current facility-administered medications for this visit.    Objective: BP 132/78 mmHg  Pulse 75  Temp(Src) 98.9 F (37.2 C) (Oral)  Ht 5\' 4"  (1.626 m)  Wt 170 lb (77.111 kg)  BMI 29.17 kg/m2  SpO2 97% Gen: NAD, resting comfortably CV: RRR no murmurs rubs or gallops Lungs: CTAB no crackles, wheeze, rhonchi Ext: no edema Skin: warm, dry Neuro: grossly normal, moves all extremities  Assessment/Plan:  Essential hypertension S: controlled on lisinopril 40mg - with hctz  25mg  as recent addition (stopped amlodipine as well) but hctz had to be stopped due to BPs around 100s. Left arm was about 15-25 points higher on several checks last visit. Since stopping hctz- Bps have remained in 120s and low 130s over 70s and 80s BP Readings from Last 3 Encounters:  05/09/16 132/78  04/11/16 130/80  01/25/16 148/80  A/P:Continue current meds:  Lisinopril 40mg  alone. We discussed healthy lifestyle choices and potential to come off medication in future as he has in past. Goal 10-15 lbs down.  Last visit some concern about BP being differing higher in left arm. Today 130/80 left, 132/82 on right. Home readings vary and sometimes the right is higher than left though generally left slightly higher. No further follow up needed    Return in about 9 months (around 02/06/2017). for CPE as long as losing weight and BP controlled- has goals per avs Return precautions advised.   The duration of face-to-face time during this visit was 15 minutes. Greater than 50% of this time was spent in counseling, explanation of diagnosis, planning of further management, and/or coordination of care.    Garret Reddish, MD

## 2016-05-09 NOTE — Progress Notes (Signed)
Pre visit review using our clinic review tool, if applicable. No additional management support is needed unless otherwise documented below in the visit note. 

## 2016-06-28 ENCOUNTER — Ambulatory Visit: Payer: 59 | Admitting: Family Medicine

## 2016-07-23 ENCOUNTER — Other Ambulatory Visit: Payer: Self-pay | Admitting: Family Medicine

## 2017-01-16 ENCOUNTER — Other Ambulatory Visit: Payer: Self-pay | Admitting: Family Medicine

## 2017-02-01 ENCOUNTER — Other Ambulatory Visit (INDEPENDENT_AMBULATORY_CARE_PROVIDER_SITE_OTHER): Payer: 59

## 2017-02-01 DIAGNOSIS — Z0000 Encounter for general adult medical examination without abnormal findings: Secondary | ICD-10-CM | POA: Diagnosis not present

## 2017-02-01 DIAGNOSIS — R7989 Other specified abnormal findings of blood chemistry: Secondary | ICD-10-CM | POA: Diagnosis not present

## 2017-02-01 LAB — LIPID PANEL
CHOL/HDL RATIO: 7
Cholesterol: 266 mg/dL — ABNORMAL HIGH (ref 0–200)
HDL: 37.5 mg/dL — ABNORMAL LOW (ref >39.00)
NONHDL: 228.99
TRIGLYCERIDES: 307 mg/dL — AB (ref 0.0–149.0)
VLDL: 61.4 mg/dL — AB (ref 0.0–40.0)

## 2017-02-01 LAB — BASIC METABOLIC PANEL
BUN: 18 mg/dL (ref 6–23)
CALCIUM: 10 mg/dL (ref 8.4–10.5)
CO2: 28 mEq/L (ref 19–32)
CREATININE: 0.91 mg/dL (ref 0.40–1.50)
Chloride: 102 mEq/L (ref 96–112)
GFR: 90.99 mL/min (ref >60.00)
Glucose, Bld: 98 mg/dL (ref 70–99)
Potassium: 4.3 mEq/L (ref 3.5–5.1)
Sodium: 138 mEq/L (ref 135–145)

## 2017-02-01 LAB — HEPATIC FUNCTION PANEL
ALT: 35 U/L (ref 0–53)
AST: 30 U/L (ref 0–37)
Albumin: 4.6 g/dL (ref 3.5–5.2)
Alkaline Phosphatase: 81 U/L (ref 39–117)
BILIRUBIN DIRECT: 0.1 mg/dL (ref 0.0–0.3)
BILIRUBIN TOTAL: 0.6 mg/dL (ref 0.2–1.2)
Total Protein: 7.6 g/dL (ref 6.0–8.3)

## 2017-02-01 LAB — PSA: PSA: 1.32 ng/mL (ref 0.10–4.00)

## 2017-02-01 LAB — CBC WITH DIFFERENTIAL/PLATELET
BASOS ABS: 0 10*3/uL (ref 0.0–0.1)
Basophils Relative: 0.5 % (ref 0.0–3.0)
EOS ABS: 0.2 10*3/uL (ref 0.0–0.7)
Eosinophils Relative: 2.8 % (ref 0.0–5.0)
HEMATOCRIT: 44.5 % (ref 39.0–52.0)
HEMOGLOBIN: 15.3 g/dL (ref 13.0–17.0)
Lymphocytes Relative: 22.9 % (ref 12.0–46.0)
Lymphs Abs: 1.7 10*3/uL (ref 0.7–4.0)
MCHC: 34.4 g/dL (ref 30.0–36.0)
MCV: 85.9 fl (ref 78.0–100.0)
MONO ABS: 0.6 10*3/uL (ref 0.1–1.0)
Monocytes Relative: 8.1 % (ref 3.0–12.0)
NEUTROS ABS: 4.9 10*3/uL (ref 1.4–7.7)
Neutrophils Relative %: 65.7 % (ref 43.0–77.0)
PLATELETS: 321 10*3/uL (ref 150.0–400.0)
RBC: 5.18 Mil/uL (ref 4.22–5.81)
RDW: 12.9 % (ref 11.5–15.5)
WBC: 7.5 10*3/uL (ref 4.0–10.5)

## 2017-02-01 LAB — POC URINALSYSI DIPSTICK (AUTOMATED)
BILIRUBIN UA: NEGATIVE
Blood, UA: NEGATIVE
Glucose, UA: NEGATIVE
KETONES UA: NEGATIVE
Leukocytes, UA: NEGATIVE
Nitrite, UA: NEGATIVE
PH UA: 6
PROTEIN UA: NEGATIVE
Spec Grav, UA: 1.02
Urobilinogen, UA: 0.2

## 2017-02-01 LAB — TSH: TSH: 2.55 u[IU]/mL (ref 0.35–4.50)

## 2017-02-01 LAB — LDL CHOLESTEROL, DIRECT: Direct LDL: 157 mg/dL

## 2017-02-01 LAB — HEMOGLOBIN A1C: HEMOGLOBIN A1C: 6.1 % (ref 4.6–6.5)

## 2017-02-06 ENCOUNTER — Ambulatory Visit: Payer: 59 | Admitting: Family Medicine

## 2017-02-06 ENCOUNTER — Encounter: Payer: 59 | Admitting: Family Medicine

## 2017-02-22 ENCOUNTER — Encounter: Payer: 59 | Admitting: Family Medicine

## 2017-03-03 ENCOUNTER — Encounter: Payer: Self-pay | Admitting: Family Medicine

## 2017-03-03 ENCOUNTER — Ambulatory Visit (INDEPENDENT_AMBULATORY_CARE_PROVIDER_SITE_OTHER): Payer: 59 | Admitting: Family Medicine

## 2017-03-03 VITALS — BP 138/64 | HR 85 | Temp 98.3°F | Ht 65.0 in | Wt 169.2 lb

## 2017-03-03 DIAGNOSIS — I10 Essential (primary) hypertension: Secondary | ICD-10-CM | POA: Diagnosis not present

## 2017-03-03 DIAGNOSIS — Z0000 Encounter for general adult medical examination without abnormal findings: Secondary | ICD-10-CM | POA: Diagnosis not present

## 2017-03-03 DIAGNOSIS — Z23 Encounter for immunization: Secondary | ICD-10-CM | POA: Diagnosis not present

## 2017-03-03 DIAGNOSIS — Z1211 Encounter for screening for malignant neoplasm of colon: Secondary | ICD-10-CM

## 2017-03-03 DIAGNOSIS — E782 Mixed hyperlipidemia: Secondary | ICD-10-CM | POA: Diagnosis not present

## 2017-03-03 DIAGNOSIS — E119 Type 2 diabetes mellitus without complications: Secondary | ICD-10-CM | POA: Diagnosis not present

## 2017-03-03 NOTE — Progress Notes (Signed)
Pre visit review using our clinic review tool, if applicable. No additional management support is needed unless otherwise documented below in the visit note. 

## 2017-03-03 NOTE — Assessment & Plan Note (Signed)
S: controlled on lisinopril 40mg . 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 Work on weight loss

## 2017-03-03 NOTE — Progress Notes (Signed)
Phone: [phone]  Subjective:  Patient presents today for their annual physical. Chief complaint-noted.   See problem oriented charting- ROS- full  review of systems was completed and negative including No chest pain or shortness of breath. No headache or blurry vision.   The following were reviewed and entered/updated in epic: Past Medical History:  Diagnosis Date  . Diabetes mellitus   . Mixed hyperlipidemia   . Nonspecific elevation of levels of transaminase or lactic acid dehydrogenase (LDH)   . Unspecified essential hypertension    Patient Active Problem List   Diagnosis Date Noted  . Diabetes mellitus type II, controlled ([place]) 11/11/2010    Priority: High  . HYPERLIPIDEMIA 12/17/2007    Priority: Medium  . Essential hypertension 12/17/2007    Priority: Medium  . Situational anxiety 07/22/2010    Priority: Low   Past Surgical History:  Procedure Laterality Date  . APPENDECTOMY      Family History  Problem Relation Age of Onset  . COPD Mother   . Emphysema Mother   . Heart disease Father     heavy smoker, 72  . Coronary artery disease Father   . Heart attack Father     Medications- reviewed and updated Current Outpatient Prescriptions  Medication Sig Dispense Refill  . atorvastatin (LIPITOR) 40 MG tablet Take 1 tablet (40 mg total) by mouth daily. 90 tablet 3  . Coenzyme Q10 150 MG CAPS Take 150 capsules by mouth daily.      Marland Kitchen glucose blood (ACCU-CHEK AVIVA) test strip Check blood sugar daily as needed. 100 each 3  . glucose blood test strip 1 each by Other route daily. Use as instructed 100 each 12  . Lancet Devices (ACCU-CHEK SOFTCLIX) lancets 1 each by Other route daily. Use as instructed 1 each 11  . lisinopril (PRINIVIL,ZESTRIL) 40 MG tablet TAKE 1 TABLET(40 MG) BY MOUTH DAILY 90 tablet 0  . Multiple Vitamin (MULTIVITAMIN) tablet Take 1 tablet by mouth daily.    . Omega-3 Fatty Acids (RA FISH OIL) 1000 MG CAPS Take by mouth.      . vitamin B-12  (CYANOCOBALAMIN) 500 MCG tablet Take 500 mcg by mouth daily.       No current facility-administered medications for this visit.     Allergies-reviewed and updated No Known Allergies  Social History   Social History  . Marital status: Married    Spouse name: N/A  . Number of children: N/A  . Years of education: N/A   Social History Main Topics  . Smoking status: Never Smoker  . Smokeless tobacco: Not on file  . Alcohol use 0.6 oz/week    1 Standard drinks or equivalent per week  . Drug use: No  . Sexual activity: Not on file   Other Topics Concern  . Not on file   Social History Narrative   Married 2007. No kids. 3 yorkies.       [employer]- assembly work. 5 days a week now.    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 Oropharynx normal Neck: no thyromegaly CV: RRR no murmurs rubs or gallops Lungs: CTAB no crackles, wheeze, rhonchi Abdomen: soft/nontender/nondistended/normal bowel sounds. No rebound or guarding.  Ext: no edema Skin: warm, dry Neuro: grossly normal, moves all extremities, PERRLA Rectal: normal tone, normal sized prostate, no masses or tenderness  Diabetic Foot Exam - Simple   Simple Foot Form Diabetic Foot exam was performed with the following findings:  Yes 03/03/2017  4:33 PM  Visual Inspection No deformities, no ulcerations, no other skin breakdown bilaterally:  Yes Sensation Testing Intact to touch and monofilament testing bilaterally:  Yes Pulse Check Posterior Tibialis and Dorsalis pulse intact bilaterally:  Yes Comments     Assessment/Plan:  56 y.o. male presenting for annual physical.  Health Maintenance counseling: 1. Anticipatory guidance: Patient counseled regarding regular dental exams q6 months, eye exams - yearly but needs to update, wearing seatbelts.  2. Risk factor reduction:  Advised patient of need for regular exercise and diet rich and fruits and vegetables to reduce risk of heart attack and stroke. Exercise- maybe once a week, needs to bump to 3 in his opinion- 150 mins advised. Diet-wants to be closer to 150 on home scales- needs to cut down on the amount of food he eats- feels like choices are reasonable. Does fair amount of potatoes and loves- he wants to just cut down on portion size Wt Readings from Last 3 Encounters:  03/03/17 169 lb 3.2 oz (76.7 kg)  05/09/16 170 lb (77.1 kg)  04/11/16 171 lb (77.6 kg)  3. Immunizations/screenings/ancillary studies Immunization History  Administered Date(s) Administered  . Influenza,inj,Quad PF,36+ Mos 11/01/2013  . Pneumococcal Conjugate-13 11/01/2013  . Td 11/28/2006   Health Maintenance Due  Topic Date Due  . OPHTHALMOLOGY EXAM - needs to update this and send [place] a copy 04/29/2015  . FOOT EXAM - today  07/23/2016  . COLONOSCOPY - refer today last in 2008 11/28/2016  . TETANUS/TDAP - today 11/28/2016   4. Prostate cancer screening-  will monitor PSA trend- slight uptick today but low risk rectal exam and no symptoms. No nocturia Lab Results  Component Value Date   PSA 1.32 02/01/2017   PSA 0.92 01/18/2016   PSA 0.86 05/13/2011   5. Colon cancer screening - refer today to GI 6. Skin cancer screening- good with regular sunscreen use  Status of chronic or acute concerns   HYPERLIPIDEMIA HLD- poor control as we had to go down to once a week atorvastatin due to severe myalgias despite coenzyme q10.  He reports still some mild myalgias on once a week. He wants to work on getting back under 150 at home and for now will trial twice a week  atorvastatin as long as doesn't significantly worsen pain Lab Results  Component Value Date   CHOL 266 (H) 02/01/2017   HDL 37.50 (L) 02/01/2017   LDLCALC 56 01/09/2015   LDLDIRECT 157.0 02/01/2017   TRIG 307.0 (H) 02/01/2017   CHOLHDL 7 02/01/2017     Diabetes mellitus type II, controlled Diabetes- excellent control on no rx. Diet/exercise controlled  Essential hypertension S: controlled on lisinopril 40mg . 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 Work on weight loss  1 year physical at latest, 6 month check in reasonable for weight and blood pressure since blood pressure trending up  Orders Placed This Encounter  Procedures  . Ambulatory referral to  Gastroenterology    Referral Priority:   Routine    Referral Type:   Consultation    Referral Reason:   Specialty Services Required    Number of Visits Requested:   1   Return precautions advised.  Garret Reddish, MD

## 2017-03-03 NOTE — Patient Instructions (Addendum)
If you have not had an eye exam within a year, please get one at this time as this is important for your diabetes care. Have them fax [place] a copy.   Tdap today  1 year physical at latest, 6 month check in reasonable for weight and blood pressure since blood pressure trending up BP Readings from Last 3 Encounters:  03/03/17 138/64  05/09/16 132/78  04/11/16 130/80

## 2017-03-03 NOTE — Assessment & Plan Note (Signed)
Diabetes- excellent control on no rx. Diet/exercise controlled

## 2017-03-03 NOTE — Assessment & Plan Note (Signed)
HLD- poor control as we had to go down to once a week atorvastatin due to severe myalgias despite coenzyme q10.  He reports still some mild myalgias on once a week. He wants to work on getting back under 150 at home and for now will trial twice a week atorvastatin as long as doesn't significantly worsen pain Lab Results  Component Value Date   CHOL 266 (H) 02/01/2017   HDL 37.50 (L) 02/01/2017   LDLCALC 56 01/09/2015   LDLDIRECT 157.0 02/01/2017   TRIG 307.0 (H) 02/01/2017   CHOLHDL 7 02/01/2017

## 2017-03-07 ENCOUNTER — Telehealth: Payer: Self-pay

## 2017-03-07 ENCOUNTER — Encounter: Payer: Self-pay | Admitting: Internal Medicine

## 2017-03-07 NOTE — Telephone Encounter (Signed)
Dr. Carlean Purl as DOD this patient has been referred to [place] for a repeat colon. His last colon was 07/2007 with Dr. Gala Romney. Procedure report is in [facility]. Will you please review and let me know how to proceed. Thank you.

## 2017-03-07 NOTE — Telephone Encounter (Signed)
He can be direct schedules with me for colonoscopy

## 2017-03-07 NOTE — Telephone Encounter (Signed)
Patient scheduled for 04-21-17 with Dr. Carlean Purl.

## 2017-04-07 ENCOUNTER — Ambulatory Visit (AMBULATORY_SURGERY_CENTER): Payer: Self-pay | Admitting: *Deleted

## 2017-04-07 ENCOUNTER — Encounter: Payer: Self-pay | Admitting: Internal Medicine

## 2017-04-07 VITALS — Ht 66.0 in | Wt 171.0 lb

## 2017-04-07 DIAGNOSIS — Z1211 Encounter for screening for malignant neoplasm of colon: Secondary | ICD-10-CM

## 2017-04-07 NOTE — Progress Notes (Signed)
No allergies to eggs or soy. No problems with anesthesia.  Pt given Emmi instructions for colonoscopy  No oxygen use  No diet drug use  

## 2017-04-12 ENCOUNTER — Other Ambulatory Visit: Payer: Self-pay | Admitting: Family Medicine

## 2017-04-21 ENCOUNTER — Ambulatory Visit (AMBULATORY_SURGERY_CENTER): Payer: 59 | Admitting: Internal Medicine

## 2017-04-21 ENCOUNTER — Encounter: Payer: Self-pay | Admitting: Internal Medicine

## 2017-04-21 VITALS — BP 130/81 | HR 56 | Temp 98.0°F | Resp 17 | Ht 65.0 in | Wt 169.0 lb

## 2017-04-21 DIAGNOSIS — Z1211 Encounter for screening for malignant neoplasm of colon: Secondary | ICD-10-CM | POA: Diagnosis present

## 2017-04-21 DIAGNOSIS — K635 Polyp of colon: Secondary | ICD-10-CM

## 2017-04-21 DIAGNOSIS — Z1212 Encounter for screening for malignant neoplasm of rectum: Secondary | ICD-10-CM

## 2017-04-21 DIAGNOSIS — D125 Benign neoplasm of sigmoid colon: Secondary | ICD-10-CM

## 2017-04-21 MED ORDER — SODIUM CHLORIDE 0.9 % IV SOLN: 500.0000 mL | INTRAVENOUS | Status: DC

## 2017-04-21 NOTE — Op Note (Signed)
[facility] Patient Name: Alex [name] Procedure Date: 04/21/2017 7:51 AM MRN: [id number] Endoscopist: Gatha Mayer , MD Age: 56 Referring MD:  Date of Birth: October 30, 1961 Gender: Male Account #: [id number] Procedure:                Colonoscopy Indications:              Screening for colorectal malignant neoplasm Medicines:                Propofol per Anesthesia, Monitored Anesthesia Care Procedure:                Pre-Anesthesia Assessment:                           - Prior to the procedure, a History and Physical                            was performed, and patient medications and                            allergies were reviewed. The patient's tolerance of                            previous anesthesia was also reviewed. The risks                            and benefits of the procedure and the sedation                            options and risks were discussed with the patient.                            All questions were answered, and informed consent                            was obtained. Prior Anticoagulants: The patient has                            taken no previous anticoagulant or antiplatelet                            agents. ASA Grade Assessment: II - A patient with                            mild systemic disease. After reviewing the risks                            and benefits, the patient was deemed in                            satisfactory condition to undergo the procedure.                           After obtaining informed consent, the colonoscope  was passed under direct vision. Throughout the                            procedure, the patient's blood pressure, pulse, and                            oxygen saturations were monitored continuously. The                            Model CF-HQ190L [id number]) scope was introduced                            through the anus and advanced to the the cecum,      identified by appendiceal orifice and ileocecal                            valve. The colonoscopy was performed without                            difficulty. The patient tolerated the procedure                            well. The quality of the bowel preparation was                            excellent. The ileocecal valve, appendiceal                            orifice, and rectum were photographed. The bowel                            preparation used was Miralax. Scope In: 8:03:10 AM Scope Out: 8:18:39 AM Scope Withdrawal Time: 0 hours 13 minutes 25 seconds  Total Procedure Duration: 0 hours 15 minutes 29 seconds  Findings:                 The perianal and digital rectal examinations were                            normal. Pertinent negatives include normal prostate                            (size, shape, and consistency).                           Two sessile polyps were found in the sigmoid colon.                            The polyps were diminutive in size. These polyps                            were removed with a cold snare. Resection and  retrieval were complete. Verification of patient                            identification for the specimen was done. Estimated                            blood loss was minimal.                           A few small-mouthed diverticula were found in the                            sigmoid colon.                           Internal hemorrhoids were found during retroflexion.                           There was a medium-sized lipoma, in the sigmoid                            colon.                           The exam was otherwise without abnormality on                            direct and retroflexion views. Complications:            No immediate complications. Estimated Blood Loss:     Estimated blood loss was minimal. Impression:               - Two diminutive polyps in the sigmoid colon,                             removed with a cold snare. Resected and retrieved.                           - Diverticulosis in the sigmoid colon.                           - Internal hemorrhoids.                           - The examination was otherwise normal on direct                            and retroflexion views. Recommendation:           - Patient has a contact number available for                            emergencies. The signs and symptoms of potential                            delayed complications were discussed with the  patient. Return to normal activities tomorrow.                            Written discharge instructions were provided to the                            patient.                           - Resume previous diet.                           - Continue present medications.                           - Repeat colonoscopy is recommended. The                            colonoscopy date will be determined after pathology                            results from today's exam become available for                            review. Gatha Mayer, MD 04/21/2017 8:31:38 AM This report has been signed electronically.

## 2017-04-21 NOTE — Patient Instructions (Addendum)
I found and removed 2 small polyps. Mild diverticulosis and hemorrhoids seen. If you have hemorrhoid problems (swelling, itching, bleeding) I am able to treat those with an in-office procedure. If you like, please call my office at [phone] to schedule an appointment and I can evaluate you further.  I will let you know pathology results and when to have another routine colonoscopy by mail and/or My Chart.  I appreciate the opportunity to care for you. Gatha Mayer, MD, FACG YOU HAD AN ENDOSCOPIC PROCEDURE TODAY AT [facility]:   Refer to the procedure report that was given to you for any specific questions about what was found during the examination.  If the procedure report does not answer your questions, please call your gastroenterologist to clarify.  If you requested that your care partner not be given the details of your procedure findings, then the procedure report has been included in a sealed envelope for you to review at your convenience later.  YOU SHOULD EXPECT: Some feelings of bloating in the abdomen. Passage of more gas than usual.  Walking can help get rid of the air that was put into your GI tract during the procedure and reduce the bloating. If you had a lower endoscopy (such as a colonoscopy or flexible sigmoidoscopy) you may notice spotting of blood in your stool or on the toilet paper. If you underwent a bowel prep for your procedure, you may not have a normal bowel movement for a few days.  Please Note:  You might notice some irritation and congestion in your nose or some drainage.  This is from the oxygen used during your procedure.  There is no need for concern and it should clear up in a day or so.  SYMPTOMS TO REPORT IMMEDIATELY:   Following lower endoscopy (colonoscopy or flexible sigmoidoscopy):  Excessive amounts of blood in the stool  Significant tenderness or worsening of abdominal pains  Swelling of the abdomen that is new, acute  Fever  of 100F or higher For urgent or emergent issues, a gastroenterologist can be reached at any hour by calling [phone].  Please read all handouts given to you by your recovery nurse.  DIET:  We do recommend a small meal at first, but then you may proceed to your regular diet.  Drink plenty of fluids but you should avoid alcoholic beverages for 24 hours.  ACTIVITY:  You should plan to take it easy for the rest of today and you should NOT DRIVE or use heavy machinery until tomorrow (because of the sedation medicines used during the test).    FOLLOW UP: Our staff will call the number listed on your records the next business day following your procedure to check on you and address any questions or concerns that you may have regarding the information given to you following your procedure. If we do not reach you, we will leave a message.  However, if you are feeling well and you are not experiencing any problems, there is no need to return our call.  We will assume that you have returned to your regular daily activities without incident.  If any biopsies were taken you will be contacted by phone or by letter within the next 1-3 weeks.  Please call us at [phone] if you have not heard about the biopsies in 3 weeks.    SIGNATURES/CONFIDENTIALITY: You and/or your care partner have signed paperwork which will be entered into your electronic medical record.  These  signatures attest to the fact that that the information above on your After Visit Summary has been reviewed and is understood.  Full responsibility of the confidentiality of this discharge information lies with you and/or your care-partner.  Thank you for letting us take care of your healthcare needs today.

## 2017-04-21 NOTE — Progress Notes (Signed)
Called to room to assist during endoscopic procedure.  Patient ID and intended procedure confirmed with present staff. Received instructions for my participation in the procedure from the performing physician.  

## 2017-04-21 NOTE — Progress Notes (Signed)
A and O x3. Report to RN. Tolerated MAC anesthesia well.

## 2017-04-25 ENCOUNTER — Telehealth: Payer: Self-pay

## 2017-04-25 NOTE — Telephone Encounter (Signed)
  Follow up Call-  Call back number 04/21/2017  Post procedure Call Back phone  # [phone]  Permission to leave phone message Yes  Some recent data might be hidden     Patient questions:  Do you have a fever, pain , or abdominal swelling? No. Pain Score  0 *  Have you tolerated food without any problems? Yes.    Have you been able to return to your normal activities? No.  Do you have any questions about your discharge instructions: Diet   No. Medications  No. Follow up visit  No.  Do you have questions or concerns about your Care? No.  Actions: * If pain score is 4 or above: No action needed, pain <4.  No problems noted per pt.  Pt asked if he needed to do anything about his hemorrhoids.  I read the report and Dr. Carlean Purl did not make any recommendations for the hemorrhoids.  I asked pt if his hemorrhoids caused any symptoms.  Pt said every now and then they flared up, but nothing consistent.  I advised him if they become a problems that he should call and see Dr. Carlean Purl.  Pt said he would. maw

## 2017-04-28 ENCOUNTER — Encounter: Payer: Self-pay | Admitting: Internal Medicine

## 2017-04-28 DIAGNOSIS — Z860101 Personal history of adenomatous and serrated colon polyps: Secondary | ICD-10-CM

## 2017-04-28 DIAGNOSIS — Z86010 Personal history of colonic polyps: Secondary | ICD-10-CM

## 2017-04-28 HISTORY — DX: Personal history of colonic polyps: Z86.010

## 2017-04-28 HISTORY — DX: Personal history of adenomatous and serrated colon polyps: Z86.0101

## 2017-04-28 NOTE — Progress Notes (Signed)
1 diminutive adenoma 1 hyperplastic Recall 2023 My chart letter

## 2017-08-21 ENCOUNTER — Ambulatory Visit (INDEPENDENT_AMBULATORY_CARE_PROVIDER_SITE_OTHER): Payer: 59 | Admitting: Internal Medicine

## 2017-08-21 ENCOUNTER — Encounter: Payer: Self-pay | Admitting: Internal Medicine

## 2017-08-21 VITALS — BP 152/100 | HR 68 | Temp 98.3°F | Wt 165.0 lb

## 2017-08-21 DIAGNOSIS — M79671 Pain in right foot: Secondary | ICD-10-CM | POA: Diagnosis not present

## 2017-08-21 NOTE — Patient Instructions (Addendum)
Tendinitis Tendinitis is inflammation of a tendon. A tendon is a strong cord of tissue that connects muscle to bone. Tendinitis can affect any tendon, but it most commonly affects the shoulder tendon (rotator cuff), ankle tendon (Achilles tendon), elbow tendon (triceps tendon), or one of the tendons in the wrist. What are the causes? This condition may be caused by:  Overusing a tendon or muscle. This is common.  Age-related wear and tear.  Injury.  Inflammatory conditions, such as arthritis.  Certain medicines.  What increases the risk? This condition is more likely to develop in people who do activities that involve repetitive motions. What are the signs or symptoms? Symptoms of this condition may include:  Pain.  Tenderness.  Mild swelling.  How is this diagnosed? This condition is diagnosed with a physical exam. You may also have tests, such as:  Ultrasound. This uses sound waves to make an image of your affected area.  MRI.  How is this treated? This condition may be treated by resting, icing, applying pressure (compression), and raising (elevating) the area above the level of your heart. This is known as RICE therapy. Treatment may also include:  Medicines to help reduce inflammation or to help reduce pain.  Exercises or physical therapy to strengthen and stretch the tendon.  A brace or splint.  Surgery (rare).  Follow these instructions at home:  If you have a splint or brace:  Wear the splint or brace as told by your health care provider. Remove it only as told by your health care provider.  Loosen the splint or brace if your fingers or toes tingle, become numb, or turn cold and blue.  Do not take baths, swim, or use a hot tub until your health care provider approves. Ask your health care provider if you can take showers. You may only be allowed to take sponge baths for bathing.  Do not let your splint or brace get wet if it is not waterproof. ? If your  splint or brace is not waterproof, cover it with a watertight plastic bag when you take a bath or a shower.  Keep the splint or brace clean. Managing pain, stiffness, and swelling  If directed, apply ice to the affected area. ? Put ice in a plastic bag. ? Place a towel between your skin and the bag. ? Leave the ice on for 20 minutes, 2-3 times a day.  If directed, apply heat to the affected area as often as told by your health care provider. Use the heat source that your health care provider recommends, such as a moist heat pack or a heating pad. ? Place a towel between your skin and the heat source. ? Leave the heat on for 20-30 minutes. ? Remove the heat if your skin turns bright red. This is especially important if you are unable to feel pain, heat, or cold. You may have a greater risk of getting burned.  Move the fingers or toes of the affected limb often, if this applies. This can help to prevent stiffness and lessen swelling.  If directed, elevate the affected area above the level of your heart while you are sitting or lying down. Driving  Do not drive or operate heavy machinery while taking prescription pain medicine.  Ask your health care provider when it is safe to drive if you have a splint or brace on any part of your arm or leg. Activity  Return to your normal activities as told by your health care   provider. Ask your health care provider what activities are safe for you.  Rest the affected area as told by your health care provider.  Avoid using the affected area while you are experiencing symptoms of tendinitis.  Do exercises as told by your health care provider. General instructions  If you have a splint, do not put pressure on any part of the splint until it is fully hardened. This may take several hours.  Wear an elastic bandage or compression wrap only as told by your health care provider.  Take over-the-counter and prescription medicines only as told by your  health care provider.  Keep all follow-up visits as told by your health care provider. This is important. Contact a health care provider if:  Your symptoms do not improve.  You develop new, unexplained problems, such as numbness in your hands.   Call or return to clinic prn if these symptoms worsen or fail to improve as anticipated.

## 2017-08-21 NOTE — Progress Notes (Signed)
Subjective:    Patient ID: BELL CAI, male    DOB: 05/03/1961, 56 y.o.   MRN: [id number]  HPI  56 year old patient who presents with a ten-day history of right foot pain. Prior to this episode, he was on vacation in [place] and did much walking.  He also walks considerably at work and  the magnitude of walking according to the patient was not unusual or excessive. Pain is primarily involving the right first metatarsal head and medial aspect of the right great toe.  Pain is aggravated by movement of the toe.  No erythema or soft tissue swelling.  No history of gout  Past Medical History:  Diagnosis Date  . Arthritis   . Diabetes mellitus    diet controlled  . Hx of adenomatous polyp of colon 04/28/2017  . Mixed hyperlipidemia   . Nonspecific elevation of levels of transaminase or lactic acid dehydrogenase (LDH)   . Unspecified essential hypertension      Social History   Social History  . Marital status: Married    Spouse name: N/A  . Number of children: N/A  . Years of education: N/A   Occupational History  . Not on file.   Social History Main Topics  . Smoking status: Never Smoker  . Smokeless tobacco: Never Used  . Alcohol use 0.6 oz/week    1 Standard drinks or equivalent per week  . Drug use: No  . Sexual activity: Not on file   Other Topics Concern  . Not on file   Social History Narrative   Married 2007. No kids. 3 yorkies.       [employer]- assembly work. 5 days a week now.    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 APPENDECTOMY  1965    Family History  Problem Relation Age of Onset  . COPD Mother   . Emphysema Mother   . Heart disease Father        heavy smoker, 47  . Coronary artery disease Father   . Heart attack Father   . Colon cancer Neg Hx     No Known  Allergies  Current Outpatient Prescriptions on File Prior to Visit  Medication Sig Dispense Refill  . atorvastatin (LIPITOR) 40 MG tablet Take 1 tablet (40 mg total) by mouth daily. 90 tablet 3  . Coenzyme Q10 150 MG CAPS Take 150 capsules by mouth daily.      Marland Kitchen glucose blood (ACCU-CHEK AVIVA) test strip Check blood sugar daily as needed. 100 each 3  . glucose blood test strip 1 each by Other route daily. Use as instructed 100 each 12  . Lancet Devices (ACCU-CHEK SOFTCLIX) lancets 1 each by Other route daily. Use as instructed 1 each 11  . lisinopril (PRINIVIL,ZESTRIL) 40 MG tablet TAKE 1 TABLET(40 MG) BY MOUTH DAILY 90 tablet 1  . Multiple Vitamin (MULTIVITAMIN) tablet Take 1 tablet by mouth daily.    . Omega-3 Fatty Acids (RA FISH OIL) 1000 MG CAPS Take by mouth.      . vitamin B-12 (CYANOCOBALAMIN) 500 MCG tablet Take 500 mcg by mouth daily.       Current Facility-Administered Medications on File Prior to Visit  Medication Dose Route Frequency Provider Last Rate Last Dose  . 0.9 %  sodium chloride infusion  500 mL Intravenous  Continuous Gatha Mayer, MD        BP (!) 152/100 (BP Location: Left Arm, Patient Position: Sitting, Cuff Size: Normal)   Pulse 68   Temp 98.3 F (36.8 C) (Oral)   Wt 165 lb (74.8 kg)   BMI 27.46 kg/m      Review of Systems  Constitutional: Negative for appetite change, chills, fatigue and fever.  HENT: Negative for congestion, dental problem, ear pain, hearing loss, sore throat, tinnitus, trouble swallowing and voice change.   Eyes: Negative for pain, discharge and visual disturbance.  Respiratory: Negative for cough, chest tightness, wheezing and stridor.   Cardiovascular: Negative for chest pain, palpitations and leg swelling.  Gastrointestinal: Negative for abdominal distention, abdominal pain, blood in stool, constipation, diarrhea, nausea and vomiting.  Genitourinary: Negative for difficulty urinating, discharge, flank pain, genital sores,  hematuria and urgency.  Musculoskeletal: Negative for arthralgias, back pain, gait problem, joint swelling, myalgias and neck stiffness.       Right great toe pain  Skin: Negative for rash.  Neurological: Negative for dizziness, syncope, speech difficulty, weakness, numbness and headaches.  Hematological: Negative for adenopathy. Does not bruise/bleed easily.  Psychiatric/Behavioral: Negative for behavioral problems and dysphoric mood. The patient is not nervous/anxious.        Objective:   Physical Exam  Constitutional: He appears well-developed and well-nourished. No distress.  Repeat blood pressure 150/100  Does not walk with a limp  Musculoskeletal:  Slight discomfort with palpation over the medial aspect of the right great toe Flexion and extension of the toe does not appear to cause any discomfort No erythema or soft tissue swelling          Assessment & Plan:   Right great toe pain.  Probable overuse tendinitis.  Proper foot care discussed.  Continue ibuprofen Will call if unimproved  Nyoka Cowden

## 2017-10-14 ENCOUNTER — Other Ambulatory Visit: Payer: Self-pay | Admitting: Family Medicine

## 2017-10-16 ENCOUNTER — Other Ambulatory Visit: Payer: Self-pay | Admitting: Family Medicine

## 2018-02-16 IMAGING — DX DG LUMBAR SPINE COMPLETE 4+V
5 series · 5 of 5 positions shown · non-contrast
Comparison: None.

CLINICAL DATA: 54-year-old male with a history of lumbar back pain.
No known injury

EXAM:
LUMBAR SPINE - COMPLETE 4+ VIEW

[l-spine ap]
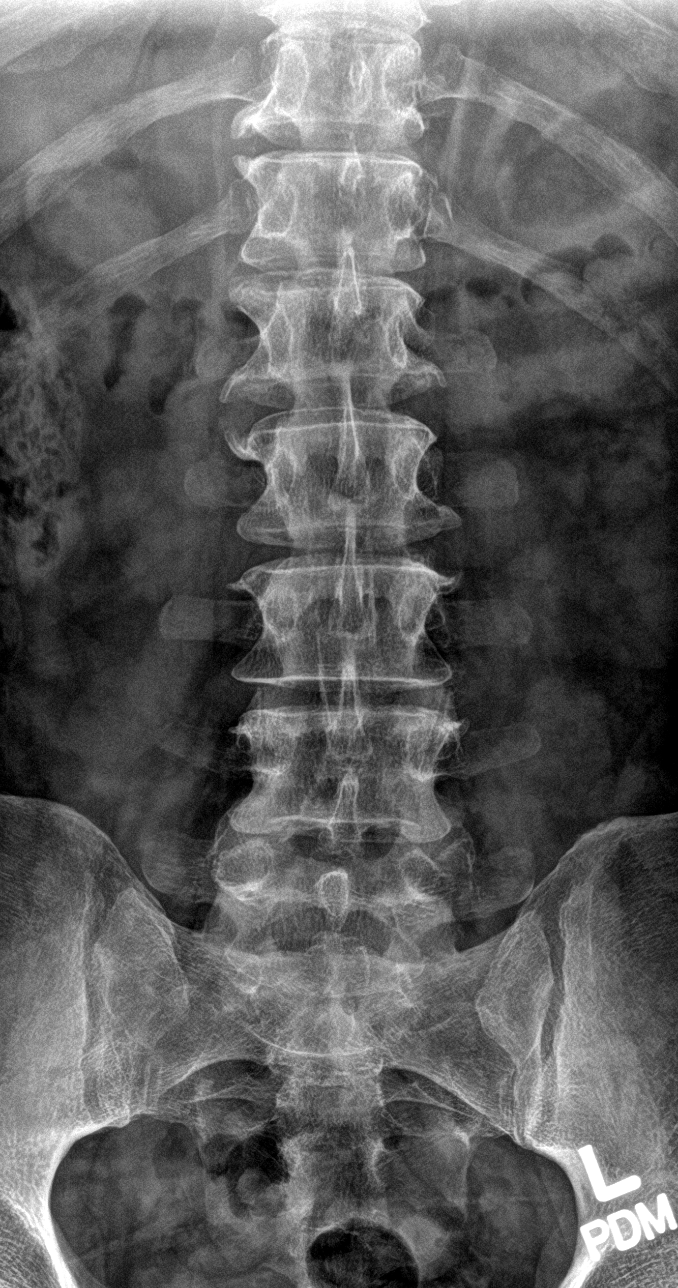

[l-spine obl (1 of 2)]
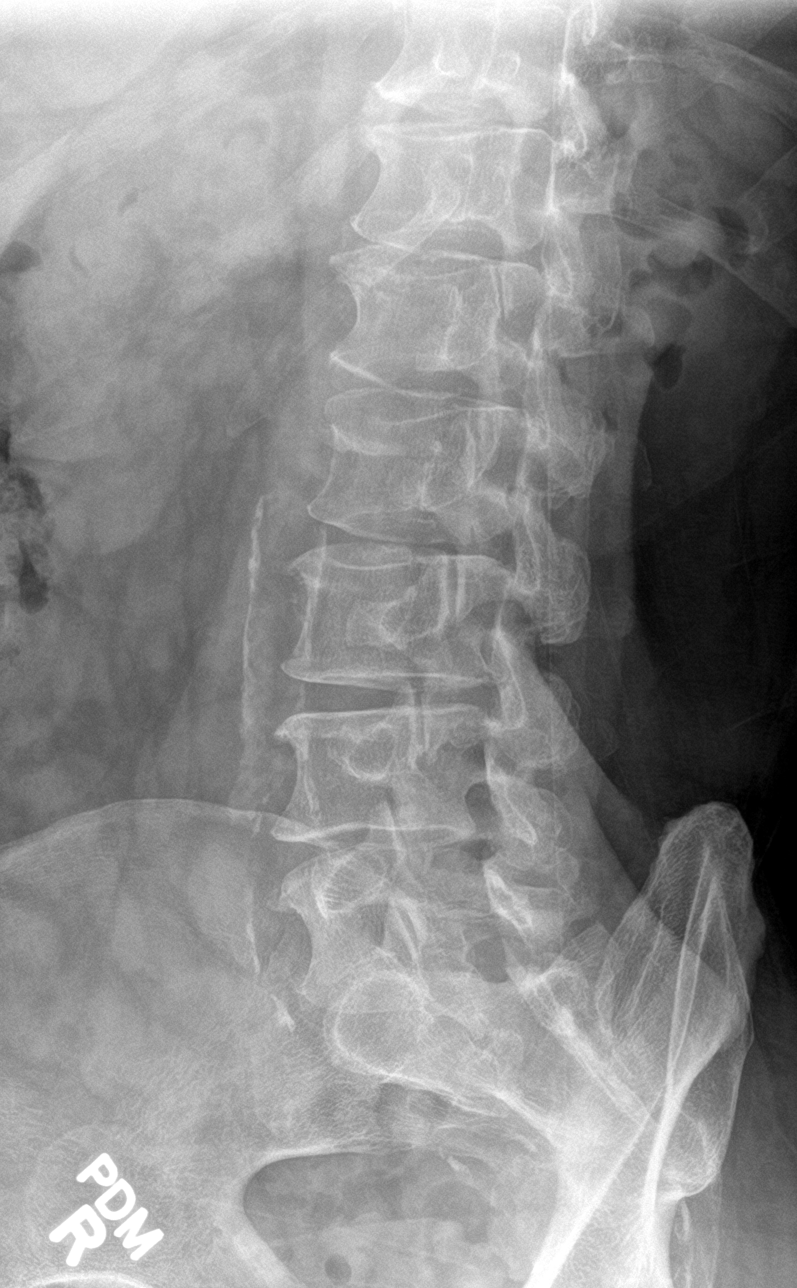

[l-spine obl (2 of 2)]
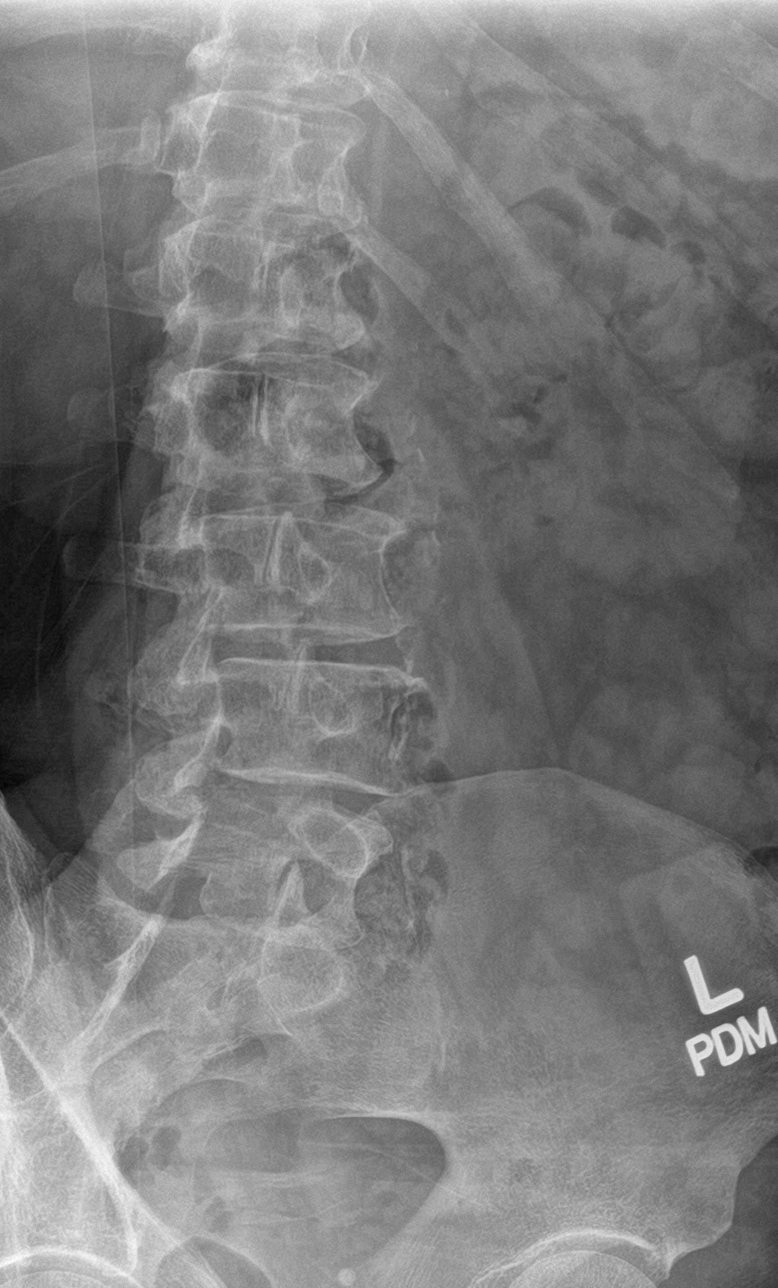

[l-spine lat]
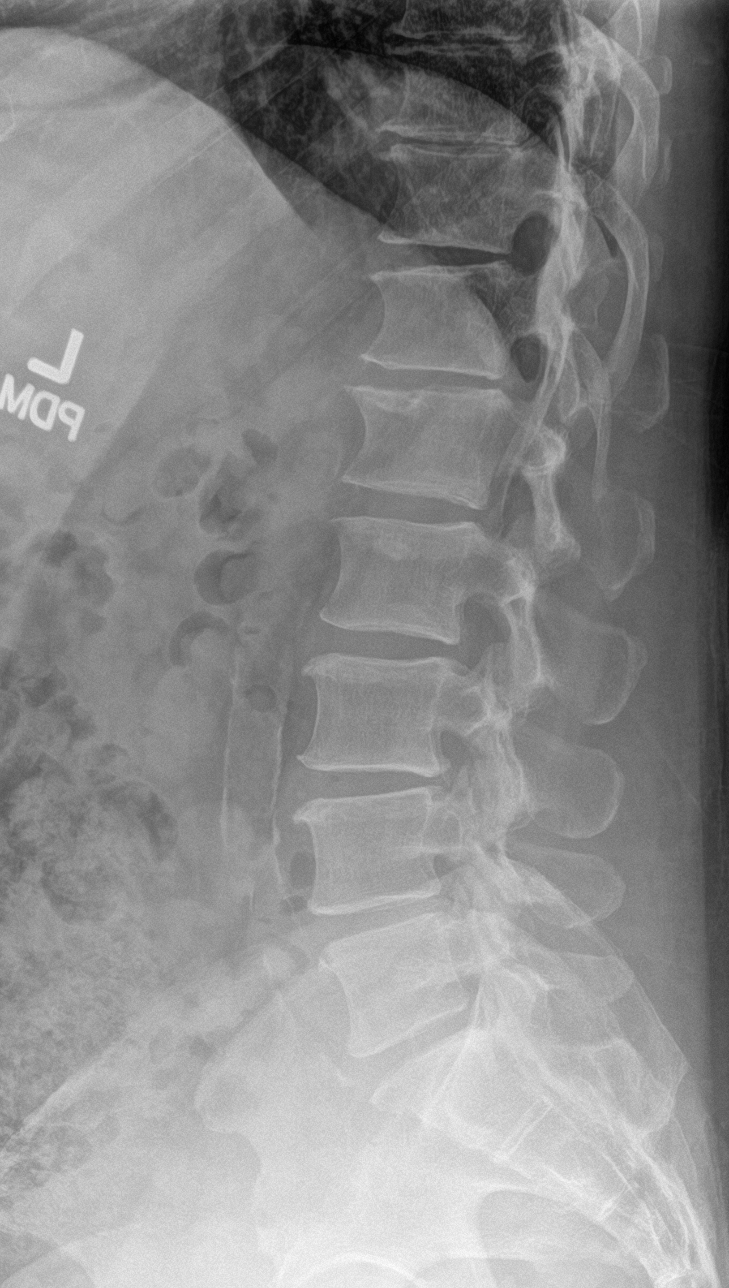

[l-spine spot]
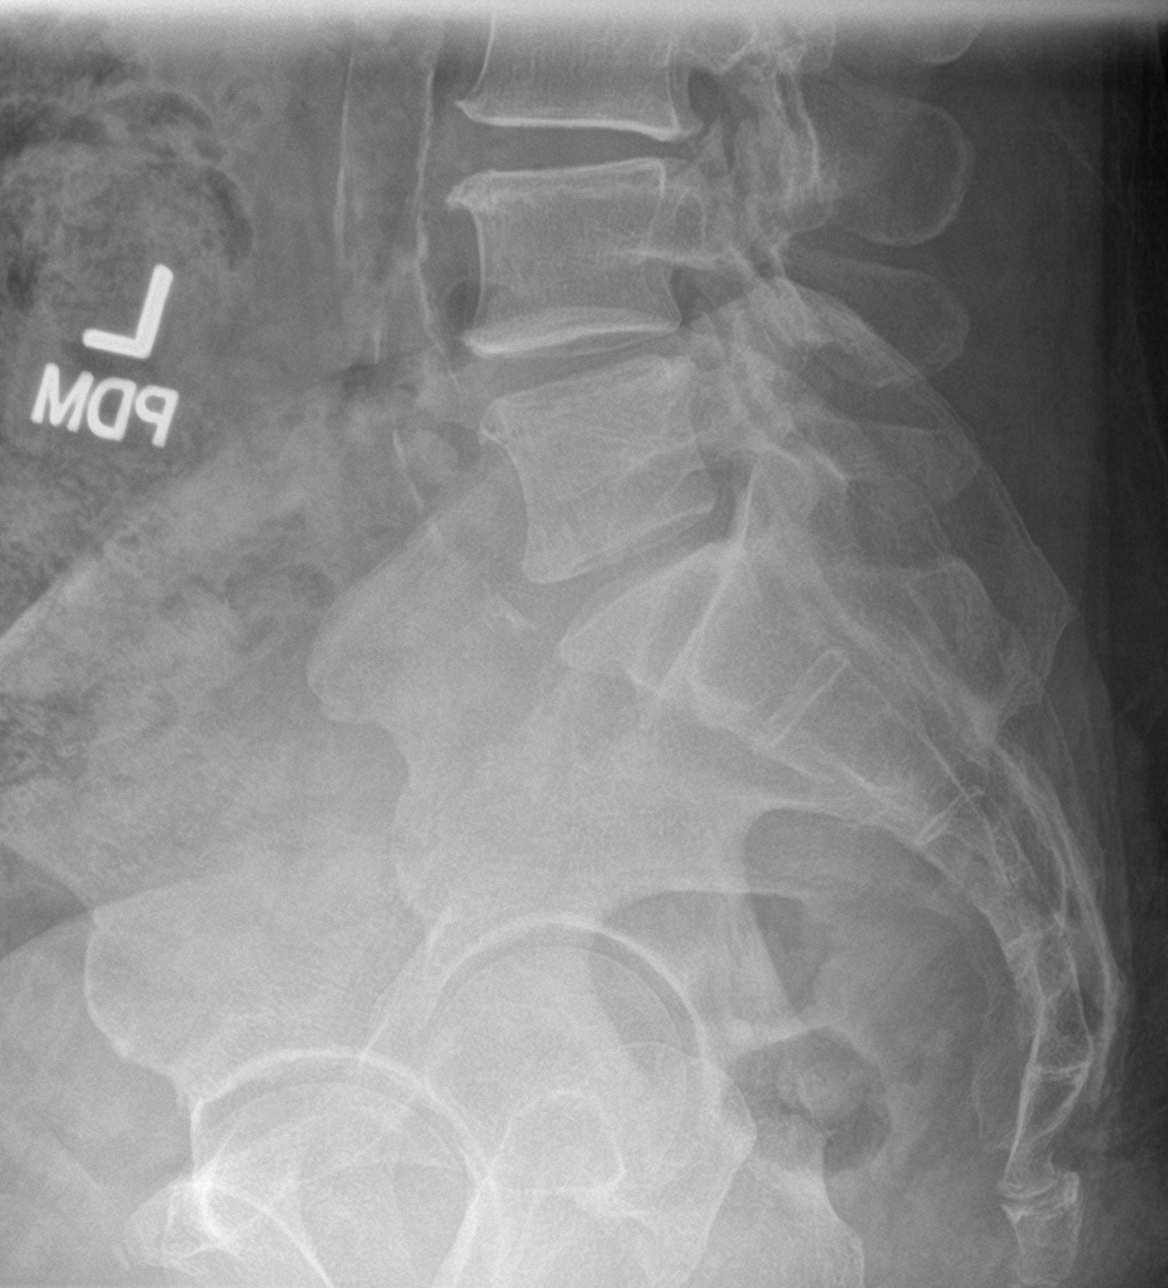

[5 of 5 positions shown; findings below may reference images not displayed]

FINDINGS: Lumbar Spine:

Lumbar vertebral elements maintain normal alignment without evidence
of subluxation. Trace retrolisthesis of L3 on L4.

No fracture line identified.

Vertebral body heights relatively maintained.

Irregularity at the superior endplate of L1, likely a Schmorl's
node.

No significant disc space narrowing. Early degenerative disc disease
of the lower thoracic spine and L5-S1.

Oblique images demonstrate no displaced pars defect.

Atherosclerotic calcifications.

Unremarkable appearance of the visualized abdomen.
IMPRESSION: Negative for acute fracture or malalignment of the lumbar spine.

Minimal degenerative disc disease of of the lower thoracic spine and
L5-S1.

Atherosclerosis

## 2018-04-22 ENCOUNTER — Other Ambulatory Visit: Payer: Self-pay | Admitting: Family Medicine

## 2018-05-21 ENCOUNTER — Other Ambulatory Visit: Payer: Self-pay | Admitting: Family Medicine

## 2018-05-21 NOTE — Telephone Encounter (Signed)
Called and left a voicemail message asking for a return phone call for patient to schedule an appointment as it has been over a year since we have seen him.

## 2018-05-23 NOTE — Telephone Encounter (Signed)
Patient scheduled for 07/12/18

## 2018-07-02 ENCOUNTER — Ambulatory Visit: Payer: 59 | Admitting: Family Medicine

## 2018-07-27 ENCOUNTER — Other Ambulatory Visit: Payer: Self-pay | Admitting: Family Medicine

## 2018-08-01 ENCOUNTER — Encounter: Payer: Self-pay | Admitting: Family Medicine

## 2018-08-01 ENCOUNTER — Ambulatory Visit: Payer: 59 | Admitting: Family Medicine

## 2018-08-01 VITALS — BP 116/76 | HR 65 | Temp 98.1°F | Ht 65.0 in | Wt 168.8 lb

## 2018-08-01 DIAGNOSIS — M791 Myalgia, unspecified site: Secondary | ICD-10-CM | POA: Diagnosis not present

## 2018-08-01 DIAGNOSIS — E119 Type 2 diabetes mellitus without complications: Secondary | ICD-10-CM | POA: Diagnosis not present

## 2018-08-01 DIAGNOSIS — G720 Drug-induced myopathy: Secondary | ICD-10-CM | POA: Insufficient documentation

## 2018-08-01 DIAGNOSIS — Z23 Encounter for immunization: Secondary | ICD-10-CM

## 2018-08-01 DIAGNOSIS — E782 Mixed hyperlipidemia: Secondary | ICD-10-CM

## 2018-08-01 DIAGNOSIS — I10 Essential (primary) hypertension: Secondary | ICD-10-CM

## 2018-08-01 DIAGNOSIS — T466X5A Adverse effect of antihyperlipidemic and antiarteriosclerotic drugs, initial encounter: Secondary | ICD-10-CM | POA: Insufficient documentation

## 2018-08-01 LAB — POCT GLYCOSYLATED HEMOGLOBIN (HGB A1C): Hemoglobin A1C: 5.7 % — AB (ref 4.0–5.6)

## 2018-08-01 MED ORDER — LISINOPRIL 40 MG PO TABS: 40.0000 mg | ORAL_TABLET | Freq: Every day | ORAL | 3 refills | Status: DC

## 2018-08-01 NOTE — Assessment & Plan Note (Signed)
S: poorly controlled on last check- myalgias on statin. We trialed atorvastatin 40mg  once a week and not effective for myalgias Lab Results  Component Value Date   CHOL 266 (H) 02/01/2017   HDL 37.50 (L) 02/01/2017   LDLCALC 56 01/09/2015   LDLDIRECT 157.0 02/01/2017   TRIG 307.0 (H) 02/01/2017   CHOLHDL 7 02/01/2017   A/P: need to update lipids. Going to have to focus on lifestyle to improve lipids further since cant tolerate even once weekly statin

## 2018-08-01 NOTE — Patient Instructions (Addendum)
Health Maintenance Due  Topic Date Due  . PNEUMOCOCCAL POLYSACCHARIDE VACCINE AGE 59-64 HIGH RISK -completed today 03/17/1961  . OPHTHALMOLOGY EXAM -called to get report faxed 04/29/2015  . HEMOGLOBIN A1C -completed today 08/04/2017  . FOOT EXAM -completed today 03/03/2018  . INFLUENZA VACCINE -schedule this Fall 06/28/2018   Schedule a physical in 3 months and 1 day to 6 months  I love your goal of cleaning up diet further and working on dropping 10 lbs- then rechecking cholesterol. Thanks again for trying the once a week cholesterol medicine - sorry that didn't work

## 2018-08-01 NOTE — Assessment & Plan Note (Signed)
Even with once a week statin use

## 2018-08-01 NOTE — Assessment & Plan Note (Signed)
S:  Controlled on lisinopril 40mg  BP Readings from Last 3 Encounters:  08/01/18 116/76  08/21/17 (!) 152/100  04/21/17 130/81  A/P: We discussed blood pressure goal of <140/90. Continue current meds

## 2018-08-01 NOTE — Progress Notes (Signed)
Subjective:  Alex [name] is a 57 y.o. year old very pleasant male patient who presents for/with See problem oriented charting ROS- intentional weight loss. Some stress. No anhedonia or depressed mood. No chest pain.    Past Medical History-  Patient Active Problem List   Diagnosis Date Noted  . Diabetes mellitus type II, controlled ([place]) 11/11/2010    Priority: High  . Myalgia due to statin 08/01/2018    Priority: Medium  . Hx of adenomatous polyp of colon 04/28/2017    Priority: Medium  . HYPERLIPIDEMIA 12/17/2007    Priority: Medium  . Essential hypertension 12/17/2007    Priority: Medium  . Situational anxiety 07/22/2010    Priority: Low    Medications- reviewed and updated Current Outpatient Medications  Medication Sig Dispense Refill  . atorvastatin (LIPITOR) 40 MG tablet Take 1 tablet (40 mg total) by mouth daily. 90 tablet 3  . Coenzyme Q10 150 MG CAPS Take 150 capsules by mouth daily.      Marland Kitchen glucose blood (ACCU-CHEK AVIVA) test strip Check blood sugar daily as needed. 100 each 3  . glucose blood test strip 1 each by Other route daily. Use as instructed 100 each 12  . Lancet Devices (ACCU-CHEK SOFTCLIX) lancets 1 each by Other route daily. Use as instructed 1 each 11  . lisinopril (PRINIVIL,ZESTRIL) 40 MG tablet TAKE 1 TABLET BY MOUTH DAILY 30 tablet 0  . Multiple Vitamin (MULTIVITAMIN) tablet Take 1 tablet by mouth daily.    . Omega-3 Fatty Acids (RA FISH OIL) 1000 MG CAPS Take by mouth.      . vitamin B-12 (CYANOCOBALAMIN) 500 MCG tablet Take 500 mcg by mouth daily.       Objective: BP 116/76 (BP Location: Left Arm, Cuff Size: Large)   Pulse 65   Temp 98.1 F (36.7 C) (Oral)   Ht 5\' 5"  (1.651 m)   Wt 168 lb 12.8 oz (76.6 kg)   SpO2 94%   BMI 28.09 kg/m  Gen: NAD, resting comfortably CV: RRR no murmurs rubs or gallops Lungs: CTAB no crackles, wheeze, rhonchi Abdomen: soft/nontender/nondistended/overweight Ext: no edema Skin: warm,  dry  Assessment/Plan:  Essential hypertension S:  Controlled on lisinopril 40mg  BP Readings from Last 3 Encounters:  08/01/18 116/76  08/21/17 (!) 152/100  04/21/17 130/81  A/P: We discussed blood pressure goal of <140/90. Continue current meds  Diabetes mellitus type II, controlled ([place]) S: diet and exercise controlled . Maintaining weight under 170- had gotten up over 180 due to work travel but has turned this around. He would like to trim another 10 lbs off Lab Results  Component Value Date   HGBA1C 5.7 (A) 08/01/2018   HGBA1C 6.1 02/01/2017   HGBA1C 5.8 01/18/2016   A/P: excellent control- continue current meds  Myalgia due to statin Even with once a week statin use  HYPERLIPIDEMIA S: poorly controlled on last check- myalgias on statin. We trialed atorvastatin 40mg  once a week and not effective for myalgias Lab Results  Component Value Date   CHOL 266 (H) 02/01/2017   HDL 37.50 (L) 02/01/2017   LDLCALC 56 01/09/2015   LDLDIRECT 157.0 02/01/2017   TRIG 307.0 (H) 02/01/2017   CHOLHDL 7 02/01/2017   A/P: need to update lipids. Going to have to focus on lifestyle to improve lipids further since cant tolerate even once weekly statin  3 months and 1 day to 6 months for CPE  Lab/Order associations: Controlled type 2 diabetes mellitus without complication, without long-term current  use of insulin ([place]) - Plan: Pneumococcal polysaccharide vaccine 23-valent greater than or equal to 2yo subcutaneous/IM, POCT glycosylated hemoglobin (Hb A1C)  Need for prophylactic vaccination against Streptococcus pneumoniae (pneumococcus) - Plan: Pneumococcal polysaccharide vaccine 23-valent greater than or equal to 2yo subcutaneous/IM  Essential hypertension  Myalgia due to statin  HYPERLIPIDEMIA  Meds ordered this encounter  Medications  . lisinopril (PRINIVIL,ZESTRIL) 40 MG tablet    Sig: Take 1 tablet (40 mg total) by mouth daily.    Dispense:  90 tablet    Refill:  3    Patient  needs appointment for further refills   Return precautions advised.  Garret Reddish, MD

## 2018-08-01 NOTE — Assessment & Plan Note (Signed)
S: diet and exercise controlled . Maintaining weight under 170- had gotten up over 180 due to work travel but has turned this around. He would like to trim another 10 lbs off Lab Results  Component Value Date   HGBA1C 5.7 (A) 08/01/2018   HGBA1C 6.1 02/01/2017   HGBA1C 5.8 01/18/2016   A/P: excellent control- continue current meds

## 2018-08-03 LAB — HM DIABETES EYE EXAM

## 2018-08-06 ENCOUNTER — Encounter: Payer: Self-pay | Admitting: Family Medicine

## 2018-10-09 DIAGNOSIS — S134XXA Sprain of ligaments of cervical spine, initial encounter: Secondary | ICD-10-CM | POA: Diagnosis not present

## 2018-10-09 DIAGNOSIS — M546 Pain in thoracic spine: Secondary | ICD-10-CM | POA: Diagnosis not present

## 2018-10-09 DIAGNOSIS — S335XXA Sprain of ligaments of lumbar spine, initial encounter: Secondary | ICD-10-CM | POA: Diagnosis not present

## 2018-10-17 DIAGNOSIS — M546 Pain in thoracic spine: Secondary | ICD-10-CM | POA: Diagnosis not present

## 2018-10-17 DIAGNOSIS — S134XXA Sprain of ligaments of cervical spine, initial encounter: Secondary | ICD-10-CM | POA: Diagnosis not present

## 2018-10-17 DIAGNOSIS — S335XXA Sprain of ligaments of lumbar spine, initial encounter: Secondary | ICD-10-CM | POA: Diagnosis not present

## 2018-11-02 ENCOUNTER — Encounter: Payer: 59 | Admitting: Family Medicine

## 2019-01-01 ENCOUNTER — Encounter: Payer: Self-pay | Admitting: Family Medicine

## 2019-01-01 ENCOUNTER — Ambulatory Visit (INDEPENDENT_AMBULATORY_CARE_PROVIDER_SITE_OTHER): Payer: 59 | Admitting: Family Medicine

## 2019-01-01 VITALS — BP 130/82 | HR 66 | Temp 98.1°F | Ht 64.0 in | Wt 166.8 lb

## 2019-01-01 DIAGNOSIS — E119 Type 2 diabetes mellitus without complications: Secondary | ICD-10-CM | POA: Diagnosis not present

## 2019-01-01 DIAGNOSIS — Z125 Encounter for screening for malignant neoplasm of prostate: Secondary | ICD-10-CM | POA: Diagnosis not present

## 2019-01-01 DIAGNOSIS — E782 Mixed hyperlipidemia: Secondary | ICD-10-CM

## 2019-01-01 DIAGNOSIS — Z23 Encounter for immunization: Secondary | ICD-10-CM

## 2019-01-01 DIAGNOSIS — Z1283 Encounter for screening for malignant neoplasm of skin: Secondary | ICD-10-CM

## 2019-01-01 DIAGNOSIS — I10 Essential (primary) hypertension: Secondary | ICD-10-CM

## 2019-01-01 DIAGNOSIS — Z0000 Encounter for general adult medical examination without abnormal findings: Secondary | ICD-10-CM

## 2019-01-01 DIAGNOSIS — Z6828 Body mass index (BMI) 28.0-28.9, adult: Secondary | ICD-10-CM

## 2019-01-01 MED ORDER — LISINOPRIL 40 MG PO TABS: 40.0000 mg | ORAL_TABLET | Freq: Every day | ORAL | 3 refills | Status: DC

## 2019-01-01 MED ORDER — LOVASTATIN 10 MG PO TABS: 10.0000 mg | ORAL_TABLET | ORAL | 3 refills | Status: DC

## 2019-01-01 NOTE — Progress Notes (Signed)
Phone: [phone]   Subjective:  Patient presents today for their annual physical. Chief complaint-noted.   See problem oriented charting- ROS- full  review of systems was completed and negative except for: Some neck stiffness or back pain at times. Hemorrhoids occasionally bother him.   BMI monitoring- elevated BMI noted: Body mass index is 28.63 kg/m. Encouraged need for healthy eating, regular exercise, weight loss.   BMI Metric Follow Up - 01/01/19 1421      BMI Metric Follow Up-Please document annually   BMI Metric Follow Up  Education provided      The following were reviewed and entered/updated in epic: Past Medical History:  Diagnosis Date  . Arthritis   . Diabetes mellitus    diet controlled  . Hx of adenomatous polyp of colon 04/28/2017  . Mixed hyperlipidemia   . Nonspecific elevation of levels of transaminase or lactic acid dehydrogenase (LDH)   . Unspecified essential hypertension    Patient Active Problem List   Diagnosis Date Noted  . Diabetes mellitus type II, controlled ([place]) 11/11/2010    Priority: High  . Myalgia due to statin 08/01/2018    Priority: Medium  . Hx of adenomatous polyp of colon 04/28/2017    Priority: Medium  . HYPERLIPIDEMIA 12/17/2007    Priority: Medium  . Essential hypertension 12/17/2007    Priority: Medium  . Situational anxiety 07/22/2010    Priority: Low   Past Surgical History:  Procedure Laterality Date  . APPENDECTOMY  1965    Family History  Problem Relation Age of Onset  . COPD Mother   . Emphysema Mother   . Heart disease Father        heavy smoker, 10  . Coronary artery disease Father   . Heart attack Father   . Colon cancer Neg Hx     Medications- reviewed and updated Current Outpatient Medications  Medication Sig Dispense Refill  . Coenzyme Q10 150 MG CAPS Take 150 capsules by mouth daily.      Marland Kitchen glucose blood (ACCU-CHEK AVIVA) test strip Check blood sugar daily as needed. 100 each 3  . glucose blood  test strip 1 each by Other route daily. Use as instructed 100 each 12  . Lancet Devices (ACCU-CHEK SOFTCLIX) lancets 1 each by Other route daily. Use as instructed 1 each 11  . lisinopril (PRINIVIL,ZESTRIL) 40 MG tablet Take 1 tablet (40 mg total) by mouth daily. 90 tablet 3  . Multiple Vitamin (MULTIVITAMIN) tablet Take 1 tablet by mouth daily.    . Omega-3 Fatty Acids (RA FISH OIL) 1000 MG CAPS Take by mouth.      . vitamin B-12 (CYANOCOBALAMIN) 500 MCG tablet Take 500 mcg by mouth daily.      Marland Kitchen lovastatin (MEVACOR) 10 MG tablet Take 1 tablet (10 mg total) by mouth once a week. 13 tablet 3   No current facility-administered medications for this visit.     Allergies-reviewed and updated Allergies  Allergen Reactions  . Amlodipine     gingival hyperplasia     Social History   Social History Narrative   Married 2007. No kids. 3 yorkies.       [employer]- assembly work. 5 days a week now.    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 Oropharynx normal Neck: no thyromegaly CV: RRR no murmurs rubs or gallops Lungs: CTAB no crackles, wheeze, rhonchi Abdomen: soft/nontender/nondistended/normal bowel sounds. No rebound or guarding.  Overweight Ext: no edema Skin: warm, dry Neuro: grossly normal, moves all extremities, PERRLA Rectal exam deferred unless PSA trend concerning   Assessment and Plan  58 y.o. male presenting for annual physical.  Health Maintenance counseling: 1. Anticipatory guidance: Patient counseled regarding regular dental exams -q6 months, eye exams -yearly,   avoiding smoking and second hand smoke , limiting alcohol to 2 beverages per day - maybe 1  A week.   2. Risk factor reduction:  Advised patient of need for regular exercise and diet rich and fruits and vegetables to reduce risk of heart attack and stroke. Exercise- once a week- is active at least during the week. Diet-down 2 lbs even through the holidays. Wants to get to around 155-160 Wt Readings from Last 3 Encounters:  01/01/19 166 lb 12.8 oz (75.7 kg)  08/01/18 168 lb 12.8 oz (76.6 kg)  08/21/17 165 lb (74.8 kg)  3. Immunizations/screenings/ancillary studies-discussed flu and Shingrix today - opts in   Immunization History  Administered Date(s) Administered  . Influenza,inj,Quad PF,6+ Mos 11/01/2013, 01/01/2019  . Pneumococcal Conjugate-13 11/01/2013  . Pneumococcal Polysaccharide-23 08/01/2018  . Td 11/28/2006  . Tdap 03/03/2017  . Zoster Recombinat (Shingrix) 01/01/2019  4. Prostate cancer screening-  defer rectal exam unless PSA trend concerning- will check PSA today Lab Results  Component Value Date   PSA 1.32 02/01/2017   PSA 0.92 01/18/2016   PSA 0.86 05/13/2011   5. Colon cancer screening - history adenomatous polyp- 04/21/2017 with 5-year follow-up 6. Skin cancer screening- no dermatologist. advised regular sunscreen use. Denies worrisome, changing, or new skin lesions.  7.  Never smoker   Status of chronic or acute concerns  Some hemorrhoid issues around the holidays- may have gotten more constipated at that time.    #Diabetes mellitus-with hypertension and hyperlipidemia S: Patient has been diet and exercise controlled. A/P: Suspect stable. Continue current medications.  Update A1c   #Hyperlipidemia associated with diabetes.  With history of myalgias on statins S: Patient has been intolerant to statin due to myalgias in the past even atorvastatin 40 mg once a week.   A/P:  Patient willing to trial lovastatin 10 mg once a week-I sent this in today   #Hypertension  associated with diabetes S: Compliant with lisinopril 40 mg A/P:  Stable. Continue current medications.       Future Appointments  Date Time Provider [place]  04/04/2019  3:30 PM [facility] NURSE [facility]  07/04/2019  3:40 PM Marin Olp, MD [facility]   Six-month follow-up planned  Lab/Order associations: chicken salad around lunchtime  Preventative health care - Plan: Hemoglobin A1c, CBC, Lipid panel, Comprehensive metabolic panel, PSA  Controlled type 2 diabetes mellitus without complication, without long-term current use of insulin ([facility]) - Plan: Hemoglobin A1c  HYPERLIPIDEMIA - Plan: CBC, Lipid panel, Comprehensive metabolic panel  Essential hypertension  BMI 28.0-28.9,adult  Skin cancer screening - Plan: Ambulatory referral to Dermatology  Screening for prostate cancer - Plan: PSA  Need for prophylactic vaccination and inoculation against influenza - Plan: Flu Vaccine QUAD 36+ mos IM  Need for prophylactic vaccination and inoculation against varicella - Plan: Varicella-zoster vaccine IM  Meds ordered this encounter  Medications  . lisinopril (PRINIVIL,ZESTRIL) 40 MG tablet    Sig: Take 1 tablet (40 mg total) by mouth daily.    Dispense:  90 tablet    Refill:  3  . lovastatin (MEVACOR) 10 MG tablet    Sig: Take 1 tablet (10 mg total) by mouth once a week.    Dispense:  13 tablet    Refill:  3   Return precautions advised.  Garret Reddish, MD

## 2019-01-01 NOTE — Patient Instructions (Addendum)
Health Maintenance Due  Topic Date Due  . INFLUENZA VACCINE - today 06/28/2018   Shingrix #1 today. Repeat injection in 2-5 months. Schedule a nurse visit for the 2nd injection before you leave today (at the check out desk)  We will call you within two weeks about your referral to dermatology. If you do not hear within 3 weeks, give [place] a call.   Great to see you today-I love your idea of getting out of the  155-160 range between our next visit   Please stop by lab before you go If you do not have mychart- we will call you about results within 5 business days of [place] receiving them.  If you have mychart- we will send your results within 3 business days of [place] receiving them.  If abnormal or we want to clarify a result, we will call or mychart you to make sure you receive the message.  If you have questions or concerns or don't hear within 5-7 days, please send [place] a message or call us.

## 2019-01-02 LAB — COMPREHENSIVE METABOLIC PANEL
ALT: 46 U/L (ref 0–53)
AST: 35 U/L (ref 0–37)
Albumin: 4.8 g/dL (ref 3.5–5.2)
Alkaline Phosphatase: 74 U/L (ref 39–117)
BUN: 16 mg/dL (ref 6–23)
CALCIUM: 9.9 mg/dL (ref 8.4–10.5)
CHLORIDE: 102 meq/L (ref 96–112)
CO2: 25 mEq/L (ref 19–32)
Creatinine, Ser: 0.9 mg/dL (ref 0.40–1.50)
GFR: 86.13 mL/min (ref >60.00)
Glucose, Bld: 84 mg/dL (ref 70–99)
Potassium: 4.2 mEq/L (ref 3.5–5.1)
Sodium: 140 mEq/L (ref 135–145)
Total Bilirubin: 0.6 mg/dL (ref 0.2–1.2)
Total Protein: 7.5 g/dL (ref 6.0–8.3)

## 2019-01-02 LAB — LIPID PANEL
CHOLESTEROL: 228 mg/dL — AB (ref 0–200)
HDL: 32.8 mg/dL — ABNORMAL LOW (ref >39.00)
NonHDL: 194.82
TRIGLYCERIDES: 329 mg/dL — AB (ref 0.0–149.0)
Total CHOL/HDL Ratio: 7
VLDL: 65.8 mg/dL — AB (ref 0.0–40.0)

## 2019-01-02 LAB — CBC
HEMATOCRIT: 42.2 % (ref 39.0–52.0)
Hemoglobin: 14.4 g/dL (ref 13.0–17.0)
MCHC: 34.2 g/dL (ref 30.0–36.0)
MCV: 86.9 fl (ref 78.0–100.0)
Platelets: 306 10*3/uL (ref 150.0–400.0)
RBC: 4.86 Mil/uL (ref 4.22–5.81)
RDW: 12.9 % (ref 11.5–15.5)
WBC: 8.4 10*3/uL (ref 4.0–10.5)

## 2019-01-02 LAB — PSA: PSA: 1.25 ng/mL (ref 0.10–4.00)

## 2019-01-02 LAB — LDL CHOLESTEROL, DIRECT: Direct LDL: 148 mg/dL

## 2019-01-02 LAB — HEMOGLOBIN A1C: Hgb A1c MFr Bld: 6.2 % (ref 4.6–6.5)

## 2019-02-06 ENCOUNTER — Other Ambulatory Visit: Payer: Self-pay | Admitting: Dermatology

## 2019-02-06 DIAGNOSIS — D485 Neoplasm of uncertain behavior of skin: Secondary | ICD-10-CM | POA: Diagnosis not present

## 2019-02-06 DIAGNOSIS — D229 Melanocytic nevi, unspecified: Secondary | ICD-10-CM | POA: Diagnosis not present

## 2019-02-11 DIAGNOSIS — S335XXA Sprain of ligaments of lumbar spine, initial encounter: Secondary | ICD-10-CM | POA: Diagnosis not present

## 2019-02-11 DIAGNOSIS — M546 Pain in thoracic spine: Secondary | ICD-10-CM | POA: Diagnosis not present

## 2019-02-11 DIAGNOSIS — S134XXA Sprain of ligaments of cervical spine, initial encounter: Secondary | ICD-10-CM | POA: Diagnosis not present

## 2019-02-13 DIAGNOSIS — M546 Pain in thoracic spine: Secondary | ICD-10-CM | POA: Diagnosis not present

## 2019-02-13 DIAGNOSIS — S335XXA Sprain of ligaments of lumbar spine, initial encounter: Secondary | ICD-10-CM | POA: Diagnosis not present

## 2019-02-13 DIAGNOSIS — S134XXA Sprain of ligaments of cervical spine, initial encounter: Secondary | ICD-10-CM | POA: Diagnosis not present

## 2019-02-15 DIAGNOSIS — M546 Pain in thoracic spine: Secondary | ICD-10-CM | POA: Diagnosis not present

## 2019-02-15 DIAGNOSIS — S335XXA Sprain of ligaments of lumbar spine, initial encounter: Secondary | ICD-10-CM | POA: Diagnosis not present

## 2019-02-15 DIAGNOSIS — S134XXA Sprain of ligaments of cervical spine, initial encounter: Secondary | ICD-10-CM | POA: Diagnosis not present

## 2019-02-18 DIAGNOSIS — S335XXA Sprain of ligaments of lumbar spine, initial encounter: Secondary | ICD-10-CM | POA: Diagnosis not present

## 2019-02-18 DIAGNOSIS — S134XXA Sprain of ligaments of cervical spine, initial encounter: Secondary | ICD-10-CM | POA: Diagnosis not present

## 2019-02-18 DIAGNOSIS — M546 Pain in thoracic spine: Secondary | ICD-10-CM | POA: Diagnosis not present

## 2019-02-21 DIAGNOSIS — M546 Pain in thoracic spine: Secondary | ICD-10-CM | POA: Diagnosis not present

## 2019-02-21 DIAGNOSIS — S134XXA Sprain of ligaments of cervical spine, initial encounter: Secondary | ICD-10-CM | POA: Diagnosis not present

## 2019-02-21 DIAGNOSIS — S335XXA Sprain of ligaments of lumbar spine, initial encounter: Secondary | ICD-10-CM | POA: Diagnosis not present

## 2019-04-04 ENCOUNTER — Ambulatory Visit (INDEPENDENT_AMBULATORY_CARE_PROVIDER_SITE_OTHER): Payer: 59 | Admitting: Family Medicine

## 2019-04-04 ENCOUNTER — Encounter: Payer: Self-pay | Admitting: Family Medicine

## 2019-04-04 DIAGNOSIS — Z23 Encounter for immunization: Secondary | ICD-10-CM | POA: Diagnosis not present

## 2019-04-04 NOTE — Patient Instructions (Signed)
There are no preventive care reminders to display for this patient.  Depression screen [facility] 2/9 08/01/2018 03/03/2017  Decreased Interest 0 0  Down, Depressed, Hopeless 0 0  PHQ - 2 Score 0 0  Altered sleeping 0 -  Tired, decreased energy 0 -  Change in appetite 0 -  Feeling bad or failure about yourself  0 -  Trouble concentrating 0 -  Moving slowly or fidgety/restless 0 -  Suicidal thoughts 0 -  PHQ-9 Score 0 -

## 2019-04-04 NOTE — Progress Notes (Signed)
Per orders of Dr. Yong Channel, injection of 2nd dose of Shingrix  given by Roshini Fulwider L Ynez Eugenio in left deltoid. Patient tolerated injection well.

## 2019-07-04 ENCOUNTER — Ambulatory Visit: Payer: 59 | Admitting: Family Medicine

## 2019-07-04 ENCOUNTER — Encounter: Payer: Self-pay | Admitting: Family Medicine

## 2019-07-04 ENCOUNTER — Other Ambulatory Visit: Payer: Self-pay

## 2019-07-04 VITALS — BP 130/84 | HR 61 | Temp 98.6°F | Ht 64.0 in | Wt 166.2 lb

## 2019-07-04 DIAGNOSIS — T466X5A Adverse effect of antihyperlipidemic and antiarteriosclerotic drugs, initial encounter: Secondary | ICD-10-CM

## 2019-07-04 DIAGNOSIS — E1169 Type 2 diabetes mellitus with other specified complication: Secondary | ICD-10-CM | POA: Diagnosis not present

## 2019-07-04 DIAGNOSIS — M545 Low back pain, unspecified: Secondary | ICD-10-CM

## 2019-07-04 DIAGNOSIS — F418 Other specified anxiety disorders: Secondary | ICD-10-CM

## 2019-07-04 DIAGNOSIS — I10 Essential (primary) hypertension: Secondary | ICD-10-CM

## 2019-07-04 DIAGNOSIS — E119 Type 2 diabetes mellitus without complications: Secondary | ICD-10-CM | POA: Diagnosis not present

## 2019-07-04 DIAGNOSIS — E1159 Type 2 diabetes mellitus with other circulatory complications: Secondary | ICD-10-CM

## 2019-07-04 DIAGNOSIS — G8929 Other chronic pain: Secondary | ICD-10-CM

## 2019-07-04 DIAGNOSIS — I152 Hypertension secondary to endocrine disorders: Secondary | ICD-10-CM

## 2019-07-04 DIAGNOSIS — E663 Overweight: Secondary | ICD-10-CM

## 2019-07-04 DIAGNOSIS — E785 Hyperlipidemia, unspecified: Secondary | ICD-10-CM

## 2019-07-04 DIAGNOSIS — Z91030 Bee allergy status: Secondary | ICD-10-CM

## 2019-07-04 DIAGNOSIS — M791 Myalgia, unspecified site: Secondary | ICD-10-CM

## 2019-07-04 LAB — POCT GLYCOSYLATED HEMOGLOBIN (HGB A1C): Hemoglobin A1C: 5.6 % (ref 4.0–5.6)

## 2019-07-04 MED ORDER — EPINEPHRINE 0.3 MG/0.3ML IJ SOAJ: 0.3000 mg | INTRAMUSCULAR | 1 refills | Status: DC | PRN

## 2019-07-04 MED ORDER — TRIAMCINOLONE ACETONIDE 0.1 % EX CREA: 1.0000 "application " | TOPICAL_CREAM | Freq: Two times a day (BID) | CUTANEOUS | 0 refills | Status: DC

## 2019-07-04 NOTE — Assessment & Plan Note (Signed)
S:Anxiety has been pretty well controlled recently.  Sx flare up with test taking and confrontational situations.  A/P: No home related stress-all issues seem to be related to work- discussed counseling option- wants to help off for now

## 2019-07-04 NOTE — Assessment & Plan Note (Signed)
S: Likely poorly controlled on Lovastatin 10 mg once a week but this has been only tolerable dose. Also taking Fish Oil, CoQ-10.  Lab Results  Component Value Date   CHOL 228 (H) 01/01/2019   HDL 32.80 (L) 01/01/2019   LDLCALC 56 01/09/2015   LDLDIRECT 148.0 01/01/2019   TRIG 329.0 (H) 01/01/2019   CHOLHDL 7 01/01/2019   A/P: We agreed to recheck lipids at 18-month follow-up/physical.  For now continue on very low-dose statin since he is not having myalgias

## 2019-07-04 NOTE — Patient Instructions (Addendum)
Health Maintenance Due  Topic Date Due  . INFLUENZA VACCINE -We should have flu shots available by September. Please strongly consider getting flu shot this year. If you get your flu shot at a pharmacy- please let us know.  06/29/2019  . HEMOGLOBIN A1C - today 07/02/2019   We will call you within two weeks about your referral to Dr. Tamala Julian of sports medicine. If you do not hear within 3 weeks, give [place] a call.   Try triamcinolone twice a day for 10 days and if not improved- then please let us know- we can refer to dermatology  Epipen sent in for been sting if needed

## 2019-07-04 NOTE — Progress Notes (Signed)
Phone [phone]   Subjective:  Alex [name] is a 58 y.o. year old very pleasant male patient who presents for/with See problem oriented charting Chief Complaint  Patient presents with  . Follow-up  . Diabetes  . Hypertension  . Hyperlipidemia  . Anxiety   ROS- Denies CP, SOB. Reports visual changes, due for annual eye exam.    Past Medical History-  Patient Active Problem List   Diagnosis Date Noted  . Diabetes mellitus type II, controlled ([place]) 11/11/2010    Priority: High  . Myalgia due to statin 08/01/2018    Priority: Medium  . Hx of adenomatous polyp of colon 04/28/2017    Priority: Medium  . Hyperlipidemia associated with type 2 diabetes mellitus ([place]) 12/17/2007    Priority: Medium  . Hypertension associated with diabetes ([place]) 12/17/2007    Priority: Medium  . Bee sting allergy 07/04/2019    Priority: Low  . Situational anxiety 07/22/2010    Priority: Low    Medications- reviewed and updated Current Outpatient Medications  Medication Sig Dispense Refill  . Coenzyme Q10 150 MG CAPS Take 150 capsules by mouth daily.      Marland Kitchen glucose blood (ACCU-CHEK AVIVA) test strip Check blood sugar daily as needed. 100 each 3  . glucose blood test strip 1 each by Other route daily. Use as instructed 100 each 12  . Lancet Devices (ACCU-CHEK SOFTCLIX) lancets 1 each by Other route daily. Use as instructed 1 each 11  . lisinopril (PRINIVIL,ZESTRIL) 40 MG tablet Take 1 tablet (40 mg total) by mouth daily. 90 tablet 3  . lovastatin (MEVACOR) 10 MG tablet Take 1 tablet (10 mg total) by mouth once a week. 13 tablet 3  . Multiple Vitamin (MULTIVITAMIN) tablet Take 1 tablet by mouth daily.    . Omega-3 Fatty Acids (RA FISH OIL) 1000 MG CAPS Take by mouth.      . vitamin B-12 (CYANOCOBALAMIN) 500 MCG tablet Take 500 mcg by mouth daily.      Marland Kitchen EPINEPHrine 0.3 mg/0.3 mL IJ SOAJ injection Inject 0.3 mLs (0.3 mg total) into the muscle as needed for anaphylaxis. 2 each 1  . triamcinolone  cream (KENALOG) 0.1 % Apply 1 application topically 2 (two) times daily. For 7-10 days maximum 80 g 0   No current facility-administered medications for this visit.      Objective:  BP 130/84 (BP Location: Left Arm, Patient Position: Sitting, Cuff Size: Normal)   Pulse 61   Temp 98.6 F (37 C) (Oral)   Ht 5\' 4"  (1.626 m)   Wt 166 lb 3.2 oz (75.4 kg)   SpO2 96%   BMI 28.53 kg/m  Gen: NAD, resting comfortably CV: RRR no murmurs rubs or gallops Lungs: CTAB no crackles, wheeze, rhonchi Abdomen: soft/nontender/nondistended/normal bowel sounds.   Ext: no edema Skin: warm, dry    Assessment and Plan   # Low back pain S:issues for years and years- usually better with chiropractic care but unfortunately has not improved with that this year. Seemed to start in left low back and then to mid back and then to right low back. Has been bad enough he hasnt wanted to go to work at times.prolonged sitting really bothers him.   Has ordered some turmeric.    A/P: with ongoing issues- patient would like expert opinion- will refer to Dr. Tamala Julian of sports medicine. I like his idea of trying turmeric while waiting on that visit to be scheduled.   -Patient prefers natural treatments if  possible  # Diabetes S: Diet controlled on no medication CBGs- Checking BG at home occasionally. FBG around 120s. Denies hypoglycemic episodes.  Exercise and diet- Eats meals in moderation. Has been doing more cooking at home, grilling. Eating lean protein and salads. Limiting carb intake. Exercising occasionally, nothing structure.  Lab Results  Component Value Date   HGBA1C 5.6 07/04/2019   HGBA1C 6.2 01/01/2019   HGBA1C 5.7 (A) 08/01/2018   A/P: Doing well today- A1c was done on point-of-care machine which tends to run about 0.5 lower than phlebotomy check in my experience-told patient even if we adjusted for this he would still be below 6.5 and that is excellent control- continue without medication  #hypertension  S: controlled on Lisinopril 40 mg daily. Checks BP occasionally, runs 130's-140's/80's. Reports occasional HA and dizziness- very rare.   BP Readings from Last 3 Encounters:  07/04/19 130/84  01/01/19 130/82  08/01/18 116/76  A/P:  Stable. Continue current medications.    #hyperlipidemia S: Likely poorly controlled on Lovastatin 10 mg once a week but this has been only tolerable dose. Also taking Fish Oil, CoQ-10.  Lab Results  Component Value Date   CHOL 228 (H) 01/01/2019   HDL 32.80 (L) 01/01/2019   LDLCALC 56 01/09/2015   LDLDIRECT 148.0 01/01/2019   TRIG 329.0 (H) 01/01/2019   CHOLHDL 7 01/01/2019   A/P: We agreed to recheck lipids at 21-month follow-up/physical.  For now continue on very low-dose statin since he is not having myalgias  # Anxiety S:Anxiety has been pretty well controlled recently.  Sx flare up with test taking and confrontational situations.  A/P: No home related stress-all issues seem to be related to work- discussed counseling option- wants to help off for now  #Rash S: 2-3 months ago got into poison ivy in his yard. 2 patches - one on left arm, one on right hand- still with some itching. Cortisone 10 helps ROS-not ill appearing, no fever/chills. No new medications recently. Not immunocompromised that we are aware of. No mucus membrane involvement.  A/P: Interesting-likely contact dermatitis has not cleared completely-suspect patient has an itch scratch cycle which we will try to break - triamcinolone twice a day for 10 days -derm if not better  #February had illness with fever, diarrhea-no ongoing issues since then. Never flu tested but had flu shot.  He did not request covid-19 testing today Wt Readings from Last 3 Encounters:  07/04/19 166 lb 3.2 oz (75.4 kg)  01/01/19 166 lb 12.8 oz (75.7 kg)  08/01/18 168 lb 12.8 oz (76.6 kg)   #Bee sting allergy-Patient reports episode of shortness of breath/difficulty breathing after multiple bee stings in the past.   His concern is possibly he was developing anaphylaxis- did respond to doses of Benadryl thankfully.  Patient would like to have an EpiPen on hand-apparently he had another bee sting later which did not cause as severe reaction but made him feel poorly overall though he has a hard time describing exactly how he felt.  Appears reasonable to have an EpiPen on hand if needed-if it is very expensive he can focus on Benadryl and call 911 if similar issues  #Overweight-lost a few lbs- doing some- thinks should do in am. Trying to eat a healthy diet.   Encouraged need for healthy eating, regular exercise, weight loss.    Recommended follow up: 32-month physical advised  Lab/Order associations:   ICD-10-CM   1. Controlled type 2 diabetes mellitus without complication, without long-term current use of  insulin (HCC)  E11.9 POCT glycosylated hemoglobin (Hb A1C)  2. Chronic bilateral low back pain without sciatica  M54.5 Ambulatory referral to Sports Medicine   G89.29   3. Hypertension associated with diabetes ([place])  E11.59    I10   4. Hyperlipidemia associated with type 2 diabetes mellitus ([place])  E11.69    E78.5   5. Myalgia due to statin  M79.10    T46.6X5A   6. Situational anxiety  F41.8   7. Bee sting allergy  Z91.030   8. Overweight  E66.3    Meds ordered this encounter  Medications  . triamcinolone cream (KENALOG) 0.1 %    Sig: Apply 1 application topically 2 (two) times daily. For 7-10 days maximum    Dispense:  80 g    Refill:  0  . EPINEPHrine 0.3 mg/0.3 mL IJ SOAJ injection    Sig: Inject 0.3 mLs (0.3 mg total) into the muscle as needed for anaphylaxis.    Dispense:  2 each    Refill:  1   Return precautions advised.  Garret Reddish, MD

## 2019-07-04 NOTE — Assessment & Plan Note (Signed)
Patient reports episode of shortness of breath/difficulty breathing after multiple bee stings in the past.  His concern is possibly he was developing anaphylaxis- did respond to doses of Benadryl thankfully.  Patient would like to have an EpiPen on hand-apparently he had another bee sting later which did not cause as severe reaction but made him feel poorly overall though he has a hard time describing exactly how he felt.  Appears reasonable to have an EpiPen on hand if needed-if it is very expensive he can focus on Benadryl and call 911 if similar issues

## 2019-08-18 NOTE — Progress Notes (Signed)
[facility] [address] [place], [place] [address] Phone: [phone] Subjective:   I Alex [name] am serving as a Education administrator for Dr. Hulan Saas.  I'm seeing this patient by the request  of:  Marin Olp, MD   CC: Low back pain  [id number]  Alex [name] is a 58 y.o. male coming in with complaint of low back pain. Pain radiates posterior down the leg. Back in March he experienced extreme pain where he could hardly move. States that recently he has been doing better. Chiropractor told the patient that his pain is coming from arthritis. Patient states that he has noticed that he has pain in multiple joints. No numbness and tingling noted.   Onset- chronic  Location - right lower back   Character-dull aching pain when it occurs Aggravating factors-  Reliving factors-chiropractor, exercises, seems to be better with moving. 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 Arthritis   . Diabetes mellitus    diet controlled  . Hx of adenomatous polyp of colon 04/28/2017  . Mixed hyperlipidemia   . Nonspecific elevation of levels of transaminase or lactic acid dehydrogenase (LDH)   . Unspecified essential hypertension    Past Surgical History:  Procedure Laterality Date  . APPENDECTOMY  1965   Social History   Socioeconomic History  . Marital status: Married    Spouse name: Not on file  . Number of children: Not on file  . Years of education: Not on file  . Highest education level: Not on file  Occupational History  . Not on file  Social Needs  . Financial resource strain: Not on file  . Food insecurity    Worry: Not on file    Inability: Not on file  . Transportation needs    Medical: Not on file    Non-medical: Not on file   Tobacco Use  . Smoking status: Never Smoker  . Smokeless tobacco: Never Used  Substance and Sexual Activity  . Alcohol use: Yes    Alcohol/week: 1.0 standard drinks    Types: 1 Standard drinks or equivalent per week  . Drug use: No  . Sexual activity: Not on file  Lifestyle  . Physical activity    Days per week: Not on file    Minutes per session: Not on file  . Stress: Not on file  Relationships  . Social Herbalist on phone: Not on file    Gets together: Not on file    Attends religious service: Not on file    Active member of club or organization: Not on file    Attends meetings of clubs or organizations: Not on file    Relationship status: Not on file  Other Topics Concern  . Not on file  Social History Narrative   Married 2007. No kids. 3 yorkies.       [employer]- assembly work. 5 days a week now.    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 Amlodipine     gingival hyperplasia    Family History  Problem Relation Age of Onset  .  COPD Mother   . Emphysema Mother   . Heart disease Father        heavy smoker, 38  . Coronary artery disease Father   . Heart attack Father   . Colon cancer Neg Hx      Current Outpatient Medications (Cardiovascular):  Marland Kitchen  EPINEPHrine 0.3 mg/0.3 mL IJ SOAJ injection, Inject 0.3 mLs (0.3 mg total) into the muscle as needed for anaphylaxis. Marland Kitchen  lisinopril (PRINIVIL,ZESTRIL) 40 MG tablet, Take 1 tablet (40 mg total) by mouth daily. Marland Kitchen  lovastatin (MEVACOR) 10 MG tablet, Take 1 tablet (10 mg total) by mouth once a week.    Current Outpatient Medications (Hematological):  .  vitamin B-12 (CYANOCOBALAMIN) 500 MCG tablet, Take 500 mcg by mouth daily.    Current Outpatient Medications (Other):  Marland Kitchen  Coenzyme Q10 150 MG CAPS, Take 150 capsules by mouth daily.   Marland Kitchen  glucose blood  (ACCU-CHEK AVIVA) test strip, Check blood sugar daily as needed. Marland Kitchen  glucose blood test strip, 1 each by Other route daily. Use as instructed .  Lancet Devices (ACCU-CHEK SOFTCLIX) lancets, 1 each by Other route daily. Use as instructed .  Multiple Vitamin (MULTIVITAMIN) tablet, Take 1 tablet by mouth daily. .  Omega-3 Fatty Acids (RA FISH OIL) 1000 MG CAPS, Take by mouth.   .  triamcinolone cream (KENALOG) 0.1 %, Apply 1 application topically 2 (two) times daily. For 7-10 days maximum    Past medical history, social, surgical and family history all reviewed in electronic medical record.  No pertanent information unless stated regarding to the chief complaint.   Review of Systems:  No headache, visual changes, nausea, vomiting, diarrhea, constipation, dizziness, abdominal pain, skin rash, fevers, chills, night sweats, weight loss, swollen lymph nodes, body aches, joint swelling, chest pain, shortness of breath, mood changes.  Positive muscle aches  Objective  Blood pressure (!) 150/90, pulse (!) 59, height 5\' 4"  (1.626 m), weight 165 lb (74.8 kg), SpO2 93 %.    General: No apparent distress alert and oriented x3 mood and affect normal, dressed appropriately.  HEENT: Pupils equal, extraocular movements intact  Respiratory: Patient's speak in full sentences and does not appear short of breath  Cardiovascular: No lower extremity edema, non tender, no erythema  Skin: Warm dry intact with no signs of infection or rash on extremities or on axial skeleton.  Abdomen: Soft nontender  Neuro: Cranial nerves II through XII are intact, neurovascularly intact in all extremities with 2+ DTRs and 2+ pulses.  Lymph: No lymphadenopathy of posterior or anterior cervical chain or axillae bilaterally.  Gait normal with good balance and coordination.  MSK:  tender with full range of motion and good stability and symmetric strength and tone of shoulders, elbows, wrist, hip, knee and ankles bilaterally.  Back  exam February shows a mild loss of lordosis.  Mild tenderness more over the left piriformis and truly over the back itself.  Mild positive Faber bilaterally.  Patient is negative straight leg test.  No midline tenderness.  [id number]; 15 additional minutes spent for Therapeutic exercises as stated in above notes.  This included exercises focusing on stretching, strengthening, with significant focus on eccentric aspects.   Long term goals include an improvement in range of motion, strength, endurance as well as avoiding reinjury. Patient's frequency would include in 1-2 times a day, 3-5 times a week for a duration of 6-12 weeks. Low back exercises that included:  Pelvic tilt/bracing instruction to focus on control of the pelvic  girdle and lower abdominal muscles  Glute strengthening exercises, focusing on proper firing of the glutes without engaging the low back muscles Proper stretching techniques for maximum relief for the hamstrings, hip flexors, low back and some rotation where tolerated   Proper technique shown and discussed handout in great detail with ATC.  All questions were discussed and answered.     Impression and Recommendations:     This case required medical decision making of moderate complexity. The above documentation has been reviewed and is accurate and complete Lyndal Pulley, DO       Note: This dictation was prepared with Dragon dictation along with smaller phrase technology. Any transcriptional errors that result from this process are unintentional.

## 2019-08-19 ENCOUNTER — Other Ambulatory Visit: Payer: Self-pay | Admitting: Family Medicine

## 2019-08-19 ENCOUNTER — Encounter: Payer: Self-pay | Admitting: Family Medicine

## 2019-08-19 ENCOUNTER — Other Ambulatory Visit: Payer: Self-pay

## 2019-08-19 ENCOUNTER — Other Ambulatory Visit (INDEPENDENT_AMBULATORY_CARE_PROVIDER_SITE_OTHER): Payer: 59

## 2019-08-19 ENCOUNTER — Ambulatory Visit: Payer: 59 | Admitting: Family Medicine

## 2019-08-19 VITALS — BP 150/90 | HR 59 | Ht 64.0 in | Wt 165.0 lb

## 2019-08-19 DIAGNOSIS — G5701 Lesion of sciatic nerve, right lower limb: Secondary | ICD-10-CM | POA: Diagnosis not present

## 2019-08-19 DIAGNOSIS — M2550 Pain in unspecified joint: Secondary | ICD-10-CM

## 2019-08-19 LAB — CBC WITH DIFFERENTIAL/PLATELET
Basophils Absolute: 0 10*3/uL (ref 0.0–0.1)
Basophils Relative: 0.4 % (ref 0.0–3.0)
Eosinophils Absolute: 0.1 10*3/uL (ref 0.0–0.7)
Eosinophils Relative: 1.9 % (ref 0.0–5.0)
HCT: 44 % (ref 39.0–52.0)
Hemoglobin: 15.1 g/dL (ref 13.0–17.0)
Lymphocytes Relative: 24.9 % (ref 12.0–46.0)
Lymphs Abs: 1.8 10*3/uL (ref 0.7–4.0)
MCHC: 34.4 g/dL (ref 30.0–36.0)
MCV: 86.4 fl (ref 78.0–100.0)
Monocytes Absolute: 0.5 10*3/uL (ref 0.1–1.0)
Monocytes Relative: 6.5 % (ref 3.0–12.0)
Neutro Abs: 4.9 10*3/uL (ref 1.4–7.7)
Neutrophils Relative %: 66.3 % (ref 43.0–77.0)
Platelets: 285 10*3/uL (ref 150.0–400.0)
RBC: 5.1 Mil/uL (ref 4.22–5.81)
RDW: 12.8 % (ref 11.5–15.5)
WBC: 7.4 10*3/uL (ref 4.0–10.5)

## 2019-08-19 LAB — COMPREHENSIVE METABOLIC PANEL
ALT: 34 U/L (ref 0–53)
AST: 27 U/L (ref 0–37)
Albumin: 4.8 g/dL (ref 3.5–5.2)
Alkaline Phosphatase: 70 U/L (ref 39–117)
BUN: 14 mg/dL (ref 6–23)
CO2: 29 mEq/L (ref 19–32)
Calcium: 10.2 mg/dL (ref 8.4–10.5)
Chloride: 100 mEq/L (ref 96–112)
Creatinine, Ser: 0.87 mg/dL (ref 0.40–1.50)
GFR: 89.38 mL/min (ref >60.00)
Glucose, Bld: 104 mg/dL — ABNORMAL HIGH (ref 70–99)
Potassium: 4.2 mEq/L (ref 3.5–5.1)
Sodium: 138 mEq/L (ref 135–145)
Total Bilirubin: 0.7 mg/dL (ref 0.2–1.2)
Total Protein: 8 g/dL (ref 6.0–8.3)

## 2019-08-19 LAB — IBC PANEL
Iron: 108 ug/dL (ref 42–165)
Saturation Ratios: 25.6 % (ref 20.0–50.0)
Transferrin: 301 mg/dL (ref 212.0–360.0)

## 2019-08-19 LAB — FERRITIN: Ferritin: 218.6 ng/mL (ref 22.0–322.0)

## 2019-08-19 LAB — SEDIMENTATION RATE: Sed Rate: 19 mm/hr (ref 0–20)

## 2019-08-19 LAB — TSH: TSH: 2.67 u[IU]/mL (ref 0.35–4.50)

## 2019-08-19 LAB — URIC ACID: Uric Acid, Serum: 8.1 mg/dL — ABNORMAL HIGH (ref 4.0–7.8)

## 2019-08-19 LAB — C-REACTIVE PROTEIN: CRP: <1 mg/dL (ref 0.5–20.0)

## 2019-08-19 NOTE — Patient Instructions (Signed)
Good to see you.  Head downstairs for labs Ice 20 minutes 2 times daily. Usually after activity and before bed. Exercises 3 times a week.  Turmeric 500mg  daily  Tart cherry extract 1200mg  at night Vitamin D 2000 IU daily  See me again in 6-8 weeks

## 2019-08-19 NOTE — Assessment & Plan Note (Signed)
Patient is more what appears to be normal right.  With syndrome.  Discussed with patient in great length.  Patient has had some very ongoing mild back pain and effusions x-rays show very mild arthritic changes.  I believe patient will do well with conservative therapy.  Continues to see a chiropractor, discussed over-the-counter medications, patient declined any type of prescription medication, patient is doing well follow-up with me again in 6 to 8 weeks.  Follow-up again at that time if worsening symptoms consider formal physical therapy

## 2019-08-22 LAB — VITAMIN D 1,25 DIHYDROXY
Vitamin D 1, 25 (OH)2 Total: 42 pg/mL (ref 18–72)
Vitamin D2 1, 25 (OH)2: <8 pg/mL
Vitamin D3 1, 25 (OH)2: 42 pg/mL

## 2019-08-22 LAB — CYCLIC CITRUL PEPTIDE ANTIBODY, IGG: Cyclic Citrullin Peptide Ab: <16 UNITS

## 2019-08-22 LAB — ANGIOTENSIN CONVERTING ENZYME: Angiotensin-Converting Enzyme: 1 U/L — ABNORMAL LOW (ref 9–67)

## 2019-08-22 LAB — RHEUMATOID FACTOR: Rheumatoid fact SerPl-aCnc: <14 IU/mL (ref <14)

## 2019-08-22 LAB — ANA: Anti Nuclear Antibody (ANA): NEGATIVE

## 2019-08-22 LAB — PTH, INTACT AND CALCIUM
Calcium: 10.5 mg/dL — ABNORMAL HIGH (ref 8.6–10.3)
PTH: 46 pg/mL (ref 14–64)

## 2019-08-22 LAB — CALCIUM, IONIZED: Calcium, Ion: 5.24 mg/dL (ref 4.8–5.6)

## 2020-01-03 ENCOUNTER — Other Ambulatory Visit: Payer: Self-pay

## 2020-01-04 ENCOUNTER — Other Ambulatory Visit: Payer: Self-pay | Admitting: Family Medicine

## 2020-01-06 ENCOUNTER — Other Ambulatory Visit: Payer: Self-pay

## 2020-01-06 ENCOUNTER — Encounter: Payer: Self-pay | Admitting: Family Medicine

## 2020-01-06 ENCOUNTER — Ambulatory Visit (INDEPENDENT_AMBULATORY_CARE_PROVIDER_SITE_OTHER): Payer: 59 | Admitting: Family Medicine

## 2020-01-06 VITALS — BP 154/82 | HR 64 | Temp 98.5°F | Ht 64.0 in | Wt 165.4 lb

## 2020-01-06 DIAGNOSIS — R21 Rash and other nonspecific skin eruption: Secondary | ICD-10-CM

## 2020-01-06 DIAGNOSIS — E119 Type 2 diabetes mellitus without complications: Secondary | ICD-10-CM | POA: Diagnosis not present

## 2020-01-06 DIAGNOSIS — E785 Hyperlipidemia, unspecified: Secondary | ICD-10-CM

## 2020-01-06 DIAGNOSIS — Z0000 Encounter for general adult medical examination without abnormal findings: Secondary | ICD-10-CM | POA: Diagnosis not present

## 2020-01-06 DIAGNOSIS — E1159 Type 2 diabetes mellitus with other circulatory complications: Secondary | ICD-10-CM | POA: Diagnosis not present

## 2020-01-06 DIAGNOSIS — E1169 Type 2 diabetes mellitus with other specified complication: Secondary | ICD-10-CM

## 2020-01-06 DIAGNOSIS — I152 Hypertension secondary to endocrine disorders: Secondary | ICD-10-CM

## 2020-01-06 DIAGNOSIS — Z125 Encounter for screening for malignant neoplasm of prostate: Secondary | ICD-10-CM | POA: Diagnosis not present

## 2020-01-06 DIAGNOSIS — T466X5A Adverse effect of antihyperlipidemic and antiarteriosclerotic drugs, initial encounter: Secondary | ICD-10-CM

## 2020-01-06 DIAGNOSIS — M791 Myalgia, unspecified site: Secondary | ICD-10-CM

## 2020-01-06 DIAGNOSIS — Z86010 Personal history of colonic polyps: Secondary | ICD-10-CM

## 2020-01-06 DIAGNOSIS — I10 Essential (primary) hypertension: Secondary | ICD-10-CM

## 2020-01-06 DIAGNOSIS — Z87898 Personal history of other specified conditions: Secondary | ICD-10-CM

## 2020-01-06 LAB — CBC WITH DIFFERENTIAL/PLATELET
Basophils Absolute: 0.1 10*3/uL (ref 0.0–0.1)
Basophils Relative: 0.6 % (ref 0.0–3.0)
Eosinophils Absolute: 0.2 10*3/uL (ref 0.0–0.7)
Eosinophils Relative: 2.4 % (ref 0.0–5.0)
HCT: 44.6 % (ref 39.0–52.0)
Hemoglobin: 14.8 g/dL (ref 13.0–17.0)
Lymphocytes Relative: 21.7 % (ref 12.0–46.0)
Lymphs Abs: 1.7 10*3/uL (ref 0.7–4.0)
MCHC: 33.1 g/dL (ref 30.0–36.0)
MCV: 87.9 fl (ref 78.0–100.0)
Monocytes Absolute: 0.6 10*3/uL (ref 0.1–1.0)
Monocytes Relative: 7.9 % (ref 3.0–12.0)
Neutro Abs: 5.4 10*3/uL (ref 1.4–7.7)
Neutrophils Relative %: 67.4 % (ref 43.0–77.0)
Platelets: 320 10*3/uL (ref 150.0–400.0)
RBC: 5.07 Mil/uL (ref 4.22–5.81)
RDW: 13.1 % (ref 11.5–15.5)
WBC: 8 10*3/uL (ref 4.0–10.5)

## 2020-01-06 LAB — COMPREHENSIVE METABOLIC PANEL
ALT: 30 U/L (ref 0–53)
AST: 27 U/L (ref 0–37)
Albumin: 4.6 g/dL (ref 3.5–5.2)
Alkaline Phosphatase: 82 U/L (ref 39–117)
BUN: 15 mg/dL (ref 6–23)
CO2: 30 mEq/L (ref 19–32)
Calcium: 10.2 mg/dL (ref 8.4–10.5)
Chloride: 101 mEq/L (ref 96–112)
Creatinine, Ser: 0.86 mg/dL (ref 0.40–1.50)
GFR: 90.47 mL/min (ref >60.00)
Glucose, Bld: 104 mg/dL — ABNORMAL HIGH (ref 70–99)
Potassium: 4.4 mEq/L (ref 3.5–5.1)
Sodium: 139 mEq/L (ref 135–145)
Total Bilirubin: 0.5 mg/dL (ref 0.2–1.2)
Total Protein: 7.7 g/dL (ref 6.0–8.3)

## 2020-01-06 LAB — LIPID PANEL
Cholesterol: 218 mg/dL — ABNORMAL HIGH (ref 0–200)
HDL: 38.5 mg/dL — ABNORMAL LOW (ref >39.00)
LDL Cholesterol: 145 mg/dL — ABNORMAL HIGH (ref 0–99)
NonHDL: 179.94
Total CHOL/HDL Ratio: 6
Triglycerides: 174 mg/dL — ABNORMAL HIGH (ref 0.0–149.0)
VLDL: 34.8 mg/dL (ref 0.0–40.0)

## 2020-01-06 LAB — SARS-COV-2 IGG: SARS-COV-2 IgG: 0.02

## 2020-01-06 LAB — HEMOGLOBIN A1C: Hgb A1c MFr Bld: 6.1 % (ref 4.6–6.5)

## 2020-01-06 LAB — PSA: PSA: 1.46 ng/mL (ref 0.10–4.00)

## 2020-01-06 MED ORDER — LOVASTATIN 10 MG PO TABS: 10.0000 mg | ORAL_TABLET | ORAL | 3 refills | Status: DC

## 2020-01-06 MED ORDER — LISINOPRIL 40 MG PO TABS: ORAL_TABLET | ORAL | 1 refills | Status: DC

## 2020-01-06 NOTE — Patient Instructions (Addendum)
Health Maintenance Due  Topic Date Due  . INFLUENZA VACCINE would like to talk to Dr Yong Channel - declines for now 06/29/2019  . OPHTHALMOLOGY EXAM - Needs to update diabetic eye exam and have them send [place] a copy  08/04/2019  . HEMOGLOBIN A1C - today with labs  01/04/2020   See if you can schedule follow up with Dr. Tamala Julian at the desk  We will call you within two weeks about your referral to dermatology for ongoing rash. If you do not hear within 3 weeks, give [place] a call.   Poor control today of blood pressure. We opted to do home monitoring for 2 weeks then he will message me through mychart- if home #s remain high- then likely add hydrochlorothiazide back in- we stopped this 05/09/16 because BP was running low. Also advise low salt/dash diet and regular exercise   Please stop by lab before you go If you do not have mychart- we will call you about results within 5 business days of [place] receiving them.  If you have mychart- we will send your results within 3 business days of [place] receiving them.  If abnormal or we want to clarify a result, we will call or mychart you to make sure you receive the message.  If you have questions or concerns or don't hear within 5-7 days, please send [place] a message or call us.   Recommended follow up: Return in about 6 months (around 07/05/2020) for follow up- or sooner if needed. as long as blood pressure comes down at home

## 2020-01-06 NOTE — Progress Notes (Signed)
Phone: [phone]   Subjective:  Patient presents today for their annual physical. Chief complaint-noted.   See problem oriented charting- Review of Systems  Constitutional: Negative for chills and fever.  HENT: Negative for hearing loss and tinnitus.   Eyes: Negative for blurred vision and double vision.  Respiratory: Negative for cough, shortness of breath and wheezing.   Cardiovascular: Negative for chest pain, palpitations and leg swelling.  Gastrointestinal: Negative for blood in stool, heartburn, nausea and vomiting.  Genitourinary: Negative for dysuria, frequency and urgency.  Musculoskeletal: Positive for back pain. Negative for joint pain and neck pain.       Seen by Dr. Tamala Julian but has not had improvement.   Skin: Positive for rash.       All over body started on hands and has been using several otc treatments with no help.   Neurological: Negative for dizziness, weakness and headaches.  Endo/Heme/Allergies: Negative for environmental allergies. Does not bruise/bleed easily.  Psychiatric/Behavioral: Negative for depression, memory loss and suicidal ideas. The patient does not have insomnia.    The following were reviewed and entered/updated in epic: Past Medical History:  Diagnosis Date  . Arthritis   . Diabetes mellitus    diet controlled  . Hx of adenomatous polyp of colon 04/28/2017  . Mixed hyperlipidemia   . Nonspecific elevation of levels of transaminase or lactic acid dehydrogenase (LDH)   . Unspecified essential hypertension    Patient Active Problem List   Diagnosis Date Noted  . Diabetes mellitus type II, controlled ([place]) 11/11/2010    Priority: High  . Myalgia due to statin 08/01/2018    Priority: Medium  . Hx of adenomatous polyp of colon 04/28/2017    Priority: Medium  . Hyperlipidemia associated with type 2 diabetes mellitus ([place]) 12/17/2007    Priority: Medium  . Hypertension associated with diabetes ([place]) 12/17/2007    Priority: Medium  .  Piriformis syndrome of right side 08/19/2019    Priority: Low  . Bee sting allergy 07/04/2019    Priority: Low  . Situational anxiety 07/22/2010    Priority: Low   Past Surgical History:  Procedure Laterality Date  . APPENDECTOMY  1965    Family History  Problem Relation Age of Onset  . COPD Mother   . Emphysema Mother   . Heart disease Father        heavy smoker, 57  . Coronary artery disease Father   . Heart attack Father   . Colon cancer Neg Hx     Medications- reviewed and updated Current Outpatient Medications  Medication Sig Dispense Refill  . Coenzyme Q10 150 MG CAPS Take 150 capsules by mouth daily.      Marland Kitchen EPINEPHrine 0.3 mg/0.3 mL IJ SOAJ injection Inject 0.3 mLs (0.3 mg total) into the muscle as needed for anaphylaxis. 2 each 1  . glucose blood (ACCU-CHEK AVIVA) test strip Check blood sugar daily as needed. 100 each 3  . glucose blood test strip 1 each by Other route daily. Use as instructed 100 each 12  . Lancet Devices (ACCU-CHEK SOFTCLIX) lancets 1 each by Other route daily. Use as instructed 1 each 11  . lisinopril (ZESTRIL) 40 MG tablet TAKE 1 TABLET(40 MG) BY MOUTH DAILY 90 tablet 1  . lovastatin (MEVACOR) 10 MG tablet Take 1 tablet (10 mg total) by mouth once a week. 13 tablet 3  . Multiple Vitamin (MULTIVITAMIN) tablet Take 1 tablet by mouth daily.    . Omega-3 Fatty Acids (RA FISH OIL)  1000 MG CAPS Take by mouth.      . triamcinolone cream (KENALOG) 0.1 % Apply 1 application topically 2 (two) times daily. For 7-10 days maximum 80 g 0  . vitamin B-12 (CYANOCOBALAMIN) 500 MCG tablet Take 500 mcg by mouth daily.       No current facility-administered medications for this visit.    Allergies-reviewed and updated Allergies  Allergen Reactions  . Amlodipine     gingival hyperplasia     Social History   Social History Narrative   Married 2007. No kids. 3 yorkies.       [employer]- assembly work. 5 days a week now.    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 TM normal. Bridge of nose normal. Eyelids normal.  Neck: no thyromegaly or cervical lymphadenopathy  CV: RRR no murmurs rubs or gallops Lungs: CTAB no crackles, wheeze, rhonchi Abdomen: soft/nontender/nondistended/normal bowel sounds. No rebound or guarding.  Ext: no edema Skin: warm, dry, some erythematous papules diffusely on skin- sparing face, palms Neuro: grossly normal, moves all extremities, PERRLA    Assessment and Plan  59 y.o. male presenting for annual physical.  Health Maintenance counseling: 1. Anticipatory guidance: Patient counseled regarding regular dental exams every 3 months due to bone loss. Eye exams yearly is due now- he is going to schedule,  avoiding smoking and second hand smoke , limiting alcohol to 2 beverages per day . Occasional 1 time a month at most.   2. Risk factor reduction:  Advised patient of need for regular exercise and diet rich and fruits and vegetables to reduce risk of heart attack and stroke. Exercise- Has been walking walking for about 30 minutes one day a week- encouraged 150 minutes a week exercise.  Diet- working on healthy options and portion control. Limiting red meat.  States had lost even more before Christmas- but holidays he gained weight and has been really busy with work [employer] Readings from Last 3 Encounters:  01/06/20 165 lb 6.4 oz (75 kg)  08/19/19 165 lb (74.8 kg)  07/04/19 166 lb 3.2 oz (75.4 kg)  3. Immunizations/screenings/ancillary studies- declines flu shot as close to end of season and doing good job with social distancing. Interested in covid vaccine when available- but  likely would want to wait longer period of time before getting it to see more data  Immunization History  Administered Date(s) Administered  . Influenza,inj,Quad PF,6+ Mos 11/01/2013, 01/01/2019  . Pneumococcal Conjugate-13 11/01/2013  . Pneumococcal Polysaccharide-23 08/01/2018  . Td 11/28/2006  . Tdap 03/03/2017  . Zoster Recombinat (Shingrix) 01/01/2019, 04/04/2019   4. Prostate cancer screening- low risk prior trend- update psa today with labs  Lab Results  Component Value Date   PSA 1.25 01/01/2019   PSA 1.32 02/01/2017   PSA 0.92 01/18/2016   5. Colon cancer screening - Last one 2018  With polyp due 01/05/2022 6. Skin cancer screening- Seen by dermatology in the past had areas removed from face- refer back for rash. advised regular sunscreen use. Denies worrisome, changing, or new skin  lesions But does have rash that has been ongoing issues and has spread to all over body.  7. never smoker  Status of chronic or acute concerns   #Diabetes mellitus-with hypertension and hyperlipidemia S: Patient has been diet and exercise controlled. He does not check regularly at home.  A/P: update a1c- hopefully controlled. Needs to update diabetic eye exam and have them send [place] a copy    #Hyperlipidemia associated with diabetes S: Patient has been intolerant to statin due to myalgias in the past even atorvastatin 40 mg once a week.  Patient has been on l lovastatin 10 mg once a week. He denies any changes or symptoms.  A/P:  Update lipid panel today- continue low dose lovastatin as has been only tolerable dose    #Hypertension associated with diabetes S: Compliant with lisinopril 40 mg. He has not been checking readings at home unless he feels like it is elevated. Readings have been 145/80's but sparingly checks -Calcium channel blocker caused gingival hyperplasia A/P: Poor control today of blood pressure. We opted to do home monitoring for 2 weeks then he will message me through mychart- if home  #s remain high- then likely add hydrochlorothiazide back in- we stopped this 05/09/16 because BP was running low. Also advise low salt/dash diet and regular exercise  -add hctz if needed   # diffuse rash- prior thought to be possible poison ivy. Never improved fully with steroids though will improve slightly. Refer to dermatology- will refer back to Dr. Jerrell Belfast  # low back pain - has not had substantial improvements. Encouraged follow up visits with Dr. Tamala Julian. Patient still seeing chiropractor- has seen them since he was 72-48 years old.  -he is interested in trial meloxicam which I think would be reasonable but we have to get blood pressure better controlled first.   History of cough/fever/diarrhea last march- would like antibodies checked for covid. He wants to wait a bit on covid 19 vaccine to get more data points of people getting vaccine  Recommended follow up: Return in about 6 months (around 07/05/2020) for follow up- or sooner if needed. as long as blood pressure comes down at home  Lab/Order associations: fasting   ICD-10-CM   1. Preventative health care  Z00.00 CBC with Differential/Platelet    Comprehensive metabolic panel    Lipid panel    PSA    Hemoglobin A1c  2. Controlled type 2 diabetes mellitus without complication, without long-term current use of insulin (HCC)  E11.9 CBC with Differential/Platelet    Comprehensive metabolic panel    Lipid panel    Hemoglobin A1c  3. Hypertension associated with diabetes ([place])  E11.59    I10   4. Hyperlipidemia associated with type 2 diabetes mellitus ([place])  E11.69    E78.5   5. Hx of adenomatous polyp of colon  Z86.010   6. Myalgia due to statin  M79.10    T46.6X5A   7. Screening for prostate cancer  Z12.5 PSA  8. Rash  R21 Ambulatory referral to Dermatology    Meds ordered this encounter  Medications  . lovastatin (MEVACOR) 10 MG tablet    Sig: Take 1 tablet (10 mg total) by mouth once a week.    Dispense:  13 tablet     Refill:  3  . lisinopril (ZESTRIL) 40 MG tablet    Sig: TAKE 1 TABLET(40 MG) BY MOUTH DAILY    Dispense:  90 tablet    Refill:  1    Return  precautions advised.  Garret Reddish, MD

## 2020-02-17 ENCOUNTER — Ambulatory Visit: Payer: 59 | Admitting: Dermatology

## 2020-02-17 ENCOUNTER — Other Ambulatory Visit: Payer: Self-pay

## 2020-02-17 ENCOUNTER — Encounter: Payer: Self-pay | Admitting: Dermatology

## 2020-02-17 DIAGNOSIS — L2084 Intrinsic (allergic) eczema: Secondary | ICD-10-CM

## 2020-02-17 NOTE — Progress Notes (Addendum)
   Follow-Up Visit   Subjective  Alex [name] is a 59 y.o. male who presents for the following: Follow-up (Follow up on hands - treatment x 60month halobetasol cream 0.05% better but still present).  Rash Location: Arms Duration: Months Quality: Improved Associated Signs/Symptoms: Itch and scale Modifying Factors: Halobetasol Severity:  Timing: Context:   The following portions of the chart were reviewed this encounter and updated as appropriate:     Objective  Well appearing patient in no apparent distress; mood and affect are within normal limits.  A focused examination was performed including head, neck, arms, hands, fingers, nails. Relevant physical exam findings are noted in the Assessment and Plan. Current breaking out is limited to the forearms and dorsal hand.  Visibly these nummular patches would fit either adult eczema or localized psoriasis; contact allergic dermatitis is less likely.  The halobetasol produces essentially complete clearing but it does slowly recur.  I discussed the pluses and minuses of scheduling patch testing; for now this will not be done.  He may have refills for the halobetasol for 1 year; he understands not to use it on the face or folds, and ideally when he is doing well to use it less than every day.  Alex [name] knows that he could call me if the breaking out gets worse.  Assessment & Plan

## 2020-06-27 ENCOUNTER — Other Ambulatory Visit: Payer: Self-pay | Admitting: Family Medicine

## 2020-07-03 NOTE — Progress Notes (Deleted)
Phone [phone] In person visit   Subjective:   Alex [name] is a 59 y.o. year old very pleasant male patient who presents for/with See problem oriented charting No chief complaint on file.   This visit occurred during the SARS-CoV-2 public health emergency.  Safety protocols were in place, including screening questions prior to the visit, additional usage of staff PPE, and extensive cleaning of exam room while observing appropriate contact time as indicated for disinfecting solutions.   Past Medical History-  Patient Active Problem List   Diagnosis Date Noted  . Piriformis syndrome of right side 08/19/2019  . Bee sting allergy 07/04/2019  . Myalgia due to statin 08/01/2018  . Hx of adenomatous polyp of colon 04/28/2017  . Diabetes mellitus type II, controlled ([place]) 11/11/2010  . Situational anxiety 07/22/2010  . Hyperlipidemia associated with type 2 diabetes mellitus ([place]) 12/17/2007  . Hypertension associated with diabetes ([place]) 12/17/2007    Medications- reviewed and updated Current Outpatient Medications  Medication Sig Dispense Refill  . Coenzyme Q10 150 MG CAPS Take 150 capsules by mouth daily.      Marland Kitchen EPINEPHrine 0.3 mg/0.3 mL IJ SOAJ injection Inject 0.3 mLs (0.3 mg total) into the muscle as needed for anaphylaxis. 2 each 1  . glucose blood (ACCU-CHEK AVIVA) test strip Check blood sugar daily as needed. 100 each 3  . glucose blood test strip 1 each by Other route daily. Use as instructed 100 each 12  . Lancet Devices (ACCU-CHEK SOFTCLIX) lancets 1 each by Other route daily. Use as instructed 1 each 11  . lisinopril (ZESTRIL) 40 MG tablet TAKE 1 TABLET(40 MG) BY MOUTH DAILY 90 tablet 1  . lovastatin (MEVACOR) 10 MG tablet Take 1 tablet (10 mg total) by mouth once a week. 13 tablet 3  . Multiple Vitamin (MULTIVITAMIN) tablet Take 1 tablet by mouth daily.    . Omega-3 Fatty Acids (RA FISH OIL) 1000 MG CAPS Take by mouth.      . triamcinolone cream (KENALOG) 0.1 % Apply  1 application topically 2 (two) times daily. For 7-10 days maximum 80 g 0  . vitamin B-12 (CYANOCOBALAMIN) 500 MCG tablet Take 500 mcg by mouth daily.       No current facility-administered medications for this visit.     Objective:  There were no vitals taken for this visit. Gen: NAD, resting comfortably CV: RRR no murmurs rubs or gallops Lungs: CTAB no crackles, wheeze, rhonchi Abdomen: soft/nontender/nondistended/normal bowel sounds. No rebound or guarding.  Ext: no edema Skin: warm, dry Neuro: grossly normal, moves all extremities  ***    Assessment and Plan  *** #Diabetes mellitus-with hypertension and hyperlipidemia S: Patient has been diet and exercise controlled. A/P: ***    #Hyperlipidemia associated with diabetes S: Patient has been intolerant to statin due to myalgias in the past even atorvastatin 40 mg once a week.  Patient willing to trial lovastatin 10 mg once a week A/P: ***    #Hypertension associated with diabetes S: Compliant with lisinopril 40 mg A/P: ***   No problem-specific Assessment & Plan notes found for this encounter.   Recommended follow up: ***No follow-ups on file. Future Appointments  Date Time Provider [place]  07/07/2020  9:40 AM Marin Olp, MD [facility]    Lab/Order associations: No diagnosis found.  No orders of the defined types were placed in this encounter.   Time Spent: *** minutes of total time (4:50 PM***- 4:50 PM***) was spent on the date of  the encounter performing the following actions: chart review prior to seeing the patient, obtaining history, performing a medically necessary exam, counseling on the treatment plan, placing orders, and documenting in our EHR.   Return precautions advised.  Clyde Lundborg, CMA

## 2020-07-07 ENCOUNTER — Ambulatory Visit: Payer: 59 | Admitting: Family Medicine

## 2020-09-24 NOTE — Progress Notes (Signed)
Phone [phone] In person visit   Subjective:   Alex [name] is a 59 y.o. year old very pleasant male patient who presents for/with See problem oriented charting Chief Complaint  Patient presents with  . Diabetes  . Hyperlipidemia  . Hypertension    This visit occurred during the SARS-CoV-2 public health emergency.  Safety protocols were in place, including screening questions prior to the visit, additional usage of staff PPE, and extensive cleaning of exam room while observing appropriate contact time as indicated for disinfecting solutions.   Past Medical History-  Patient Active Problem List   Diagnosis Date Noted  . Diabetes mellitus type II, controlled ([place]) 11/11/2010    Priority: High  . Myalgia due to statin 08/01/2018    Priority: Medium  . Hx of adenomatous polyp of colon 04/28/2017    Priority: Medium  . Hyperlipidemia associated with type 2 diabetes mellitus ([place]) 12/17/2007    Priority: Medium  . Hypertension associated with diabetes ([place]) 12/17/2007    Priority: Medium  . Piriformis syndrome of right side 08/19/2019    Priority: Low  . Bee sting allergy 07/04/2019    Priority: Low  . Situational anxiety 07/22/2010    Priority: Low    Medications- reviewed and updated Current Outpatient Medications  Medication Sig Dispense Refill  . Coenzyme Q10 150 MG CAPS Take 150 capsules by mouth daily.      Marland Kitchen EPINEPHrine 0.3 mg/0.3 mL IJ SOAJ injection Inject 0.3 mLs (0.3 mg total) into the muscle as needed for anaphylaxis. 2 each 1  . glucose blood (ACCU-CHEK AVIVA) test strip Check blood sugar daily as needed. 100 each 3  . glucose blood test strip 1 each by Other route daily. Use as instructed 100 each 12  . halobetasol (ULTRAVATE) 0.05 % cream Apply topically daily.    Elmore Guise Devices (ACCU-CHEK SOFTCLIX) lancets 1 each by Other route daily. Use as instructed 1 each 11  . lisinopril (ZESTRIL) 40 MG tablet TAKE 1 TABLET(40 MG) BY MOUTH DAILY 90 tablet 1  .  Multiple Vitamin (MULTIVITAMIN) tablet Take 1 tablet by mouth daily.    . Omega-3 Fatty Acids (RA FISH OIL) 1000 MG CAPS Take by mouth.      . vitamin B-12 (CYANOCOBALAMIN) 500 MCG tablet Take 500 mcg by mouth daily.       No current facility-administered medications for this visit.     Objective:  BP 118/84   Pulse 68   Temp 98.4 F (36.9 C) (Temporal)   Resp 18   Ht 5\' 4"  (1.626 m)   Wt 160 lb 9.6 oz (72.8 kg)   SpO2 97%   BMI 27.57 kg/m  Gen: NAD, resting comfortably CV: RRR no murmurs rubs or gallops Lungs: CTAB no crackles, wheeze, rhonchi Ext: no edema Skin: warm, dry, erythematous rash on hands with some fine scale on the hands. Some speckled erythematous specks on both arms- does not go beyond shirtline        Assessment and Plan   #Diabetes mellitus-with hypertension and hyperlipidemia S: Patient has been diet and exercise controlled.  Lab Results  Component Value Date   HGBA1C 6.1 01/06/2020   HGBA1C 5.6 07/04/2019   HGBA1C 6.2 01/01/2019  A/P: stable a1c 5.6 POC (would likely be 6.1 otherwise). Continue without medicatoin     #Hyperlipidemia associated with diabetes S: Patient has been intolerant to statin due to myalgias in the past even atorvastatin 40 mg once a week.   trial lovastatin 10 mg  once a week since feb 2020 A/P: holding lovastatin planned- suspect will have poorer control but want to see if rash improves- can restart next visit or 1 month if needed    #Hypertension associated with diabetes S: Compliant with lisinopril 40 mg A/P: Stable. Continue current medications.    #ongoing back and neck issues- working 50-60 hours a week  # rash on hands- working with Dr. Denna Haggard for rash on hands - almost completely disappeared with vacation in [place] and wonders if that contributes. States wash cloth gets a darker color to it when comes in contact with water (not like a washcloth typically would). 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 Does not wear gloves with work. Does wear gloves in shop at home but not always there. Has tried different detergents and soaps. hes even considering putting in a well. Considering sending off a water sample. Itches like poison oak- topical steroids help.  -only new meds feb 2020 lovastatin. In august 2020 he reported 2-3 months prior that he got into poison ivy potentially. Triamcinolone was not helpful.  - we opted to stop lovastatin to see if this makes a difference- if not may return to dermatology for their opinion or even consider allergy testing and ask about his concern about his water - I also wonder if could be a work exposure  # Flu shot today. Discussed covid 19 booster  Recommended follow up: Return in about 6 months (around 03/26/2021) for physical or sooner if needed.  Lab/Order associations:   ICD-10-CM   1. Hypertension associated with diabetes ([place])  E11.59    I15.2   2. Controlled type 2 diabetes mellitus without complication, without long-term current use of insulin (HCC)  E11.9 POCT HgB A1C  3. Hyperlipidemia associated with type 2 diabetes mellitus ([place])  E11.69    E78.5    Return precautions advised.  Garret Reddish, MD

## 2020-09-24 NOTE — Patient Instructions (Addendum)
Health Maintenance Due  Topic Date Due  . COVID-19 Vaccine (1) for booster  ClipDaily.nl Never done  . OPHTHALMOLOGY EXAM  Was done in June. Sign release of information at the check out desk for this so we can get a copy  08/04/2019  . INFLUENZA VACCINE - today  06/28/2020    Stop lovastatin for at least a month- see if rash improves.   No other changes in medicines today.

## 2020-09-25 ENCOUNTER — Encounter: Payer: Self-pay | Admitting: Family Medicine

## 2020-09-25 ENCOUNTER — Other Ambulatory Visit: Payer: Self-pay

## 2020-09-25 ENCOUNTER — Ambulatory Visit: Payer: 59 | Admitting: Family Medicine

## 2020-09-25 VITALS — BP 118/84 | HR 68 | Temp 98.4°F | Resp 18 | Ht 64.0 in | Wt 160.6 lb

## 2020-09-25 DIAGNOSIS — Z23 Encounter for immunization: Secondary | ICD-10-CM | POA: Diagnosis not present

## 2020-09-25 DIAGNOSIS — E785 Hyperlipidemia, unspecified: Secondary | ICD-10-CM

## 2020-09-25 DIAGNOSIS — E1169 Type 2 diabetes mellitus with other specified complication: Secondary | ICD-10-CM | POA: Diagnosis not present

## 2020-09-25 DIAGNOSIS — F418 Other specified anxiety disorders: Secondary | ICD-10-CM

## 2020-09-25 DIAGNOSIS — I152 Hypertension secondary to endocrine disorders: Secondary | ICD-10-CM

## 2020-09-25 DIAGNOSIS — E1159 Type 2 diabetes mellitus with other circulatory complications: Secondary | ICD-10-CM

## 2020-09-25 DIAGNOSIS — E119 Type 2 diabetes mellitus without complications: Secondary | ICD-10-CM

## 2020-09-25 DIAGNOSIS — R21 Rash and other nonspecific skin eruption: Secondary | ICD-10-CM

## 2020-09-25 LAB — POCT GLYCOSYLATED HEMOGLOBIN (HGB A1C): Hemoglobin A1C: 5.6 % (ref 4.0–5.6)

## 2020-10-09 ENCOUNTER — Ambulatory Visit
Admission: EM | Admit: 2020-10-09 | Discharge: 2020-10-09 | Disposition: A | Discharge status: Home / Self Care | Payer: 59 | Attending: Emergency Medicine | Admitting: Emergency Medicine

## 2020-10-09 ENCOUNTER — Encounter: Payer: Self-pay | Admitting: Emergency Medicine

## 2020-10-09 ENCOUNTER — Other Ambulatory Visit: Payer: Self-pay

## 2020-10-09 DIAGNOSIS — R319 Hematuria, unspecified: Secondary | ICD-10-CM | POA: Insufficient documentation

## 2020-10-09 DIAGNOSIS — R109 Unspecified abdominal pain: Secondary | ICD-10-CM | POA: Diagnosis present

## 2020-10-09 LAB — POCT URINALYSIS DIP (MANUAL ENTRY)
Bilirubin, UA: NEGATIVE
Glucose, UA: NEGATIVE mg/dL
Ketones, POC UA: NEGATIVE mg/dL
Leukocytes, UA: NEGATIVE
Nitrite, UA: NEGATIVE
Protein Ur, POC: NEGATIVE mg/dL
Spec Grav, UA: 1.025 (ref 1.010–1.025)
Urobilinogen, UA: 1 E.U./dL
pH, UA: 6.5 (ref 5.0–8.0)

## 2020-10-09 MED ORDER — TAMSULOSIN HCL 0.4 MG PO CAPS: 0.4000 mg | ORAL_CAPSULE | Freq: Every day | ORAL | 0 refills | Status: DC

## 2020-10-09 MED ORDER — TRAMADOL HCL 50 MG PO TABS
50.0000 mg | ORAL_TABLET | Freq: Two times a day (BID) | ORAL | 0 refills | Status: DC | PRN
Start: 2020-10-09 — End: 2021-02-24

## 2020-10-09 MED ORDER — IBUPROFEN 800 MG PO TABS: 800.0000 mg | ORAL_TABLET | Freq: Three times a day (TID) | ORAL | 0 refills | Status: DC

## 2020-10-09 MED ORDER — KETOROLAC TROMETHAMINE 60 MG/2ML IM SOLN
60.0000 mg | Freq: Once | INTRAMUSCULAR | Status: AC
  Administered 2020-10-09: 60 mg via INTRAMUSCULAR

## 2020-10-09 NOTE — Discharge Instructions (Addendum)
Toradol shot given in office Urine with trace blood Drink plenty of fluids and get rest Strain all urine and follow up with PCP with specimen Use flomax as directed  Ibuprofen prescribed.   Tramadol for severe break-through pain.  DO NOT TAKE prior to driving or operating heavy machinery as this medications may make you drowsy Follow up with PCP if symptoms persist Return or go to ER if you have any new or worsening symptoms (difficulty urinating, blood in urine, pain that does not moderate with medication, fever, chills, abdominal pain, etc...)

## 2020-10-09 NOTE — ED Provider Notes (Signed)
[place]   [id number] 10/09/20 Arrival Time: 7616  CC: RT flank PAIN  SUBJECTIVE: History from: patient. Alex [name] is a 59 y.o. male complains of RT flank pain x 1 day.  Denies a precipitating event or specific injury.  Localizes the pain to the RT flank.  Describes the pain as intermittent and sharp, as well as constantly achy in character.  Has tried OTC medications without relief.  Symptoms are made worse with standing from a seated position.  Denies similar symptoms in the past.  Complains of discharge after urination yesterday.  Denies concern for STDs.  Denies fever, chills, erythema, ecchymosis, effusion, weakness, numbness and tingling, saddle paresthesias, loss of bowel or bladder function, dysuria, decreased urine output.    ROS: As per HPI.  All other pertinent ROS negative.     Past Medical History:  Diagnosis Date  . Arthritis   . Diabetes mellitus    diet controlled  . Hx of adenomatous polyp of colon 04/28/2017  . Mixed hyperlipidemia   . Nonspecific elevation of levels of transaminase or lactic acid dehydrogenase (LDH)   . Unspecified essential hypertension    Past Surgical History:  Procedure Laterality Date  . APPENDECTOMY  1965   Allergies  Allergen Reactions  . Amlodipine     gingival hyperplasia    No current facility-administered medications on file prior to encounter.   Current Outpatient Medications on File Prior to Encounter  Medication Sig Dispense Refill  . Coenzyme Q10 150 MG CAPS Take 150 capsules by mouth daily.      Marland Kitchen EPINEPHrine 0.3 mg/0.3 mL IJ SOAJ injection Inject 0.3 mLs (0.3 mg total) into the muscle as needed for anaphylaxis. 2 each 1  . glucose blood (ACCU-CHEK AVIVA) test strip Check blood sugar daily as needed. 100 each 3  . glucose blood test strip 1 each by Other route daily. Use as instructed 100 each 12  . halobetasol (ULTRAVATE) 0.05 % cream Apply topically daily.    Elmore Guise Devices (ACCU-CHEK SOFTCLIX) lancets 1  each by Other route daily. Use as instructed 1 each 11  . lisinopril (ZESTRIL) 40 MG tablet TAKE 1 TABLET(40 MG) BY MOUTH DAILY 90 tablet 1  . Multiple Vitamin (MULTIVITAMIN) tablet Take 1 tablet by mouth daily.    . Omega-3 Fatty Acids (RA FISH OIL) 1000 MG CAPS Take by mouth.      . vitamin B-12 (CYANOCOBALAMIN) 500 MCG tablet Take 500 mcg by mouth daily.       Social History   Socioeconomic History  . Marital status: Married    Spouse name: Not on file  . Number of children: Not on file  . Years of education: Not on file  . Highest education level: Not on file  Occupational History  . Not on file  Tobacco Use  . Smoking status: Never Smoker  . Smokeless tobacco: Never Used  Substance and Sexual Activity  . Alcohol use: Yes    Alcohol/week: 1.0 standard drink    Types: 1 Standard drinks or equivalent per week  . Drug use: No  . Sexual activity: Not on file  Other Topics Concern  . Not on file  Social History Narrative   Married 2007. No kids. 3 yorkies.       [employer]- assembly work. 5 days a week now.    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 Difficulty of Paying Living Expenses: Not on file  Food Insecurity:   . Worried About Charity fundraiser in the Last Year: Not on file  . Ran Out of Food in the Last Year: Not on file  Transportation Needs:   . Lack of Transportation (Medical): Not on file  . Lack of Transportation (Non-Medical): Not on file  Physical Activity:   . Days of Exercise per Week: Not on file  . Minutes of Exercise per Session: Not on file  Stress:   . Feeling of Stress : Not on file  Social Connections:   . Frequency of Communication with Friends and Family: Not on file  . Frequency of Social Gatherings with Friends and Family: Not on file  . Attends Religious  Services: Not on file  . Active Member of Clubs or Organizations: Not on file  . Attends Archivist Meetings: Not on file  . Marital Status: Not on file  Intimate Partner Violence:   . Fear of Current or Ex-Partner: Not on file  . Emotionally Abused: Not on file  . Physically Abused: Not on file  . Sexually Abused: Not on file   Family History  Problem Relation Age of Onset  . COPD Mother   . Emphysema Mother   . Heart disease Father        heavy smoker, 12  . Coronary artery disease Father   . Heart attack Father   . Colon cancer Neg Hx     OBJECTIVE:  Vitals:   10/09/20 1008  BP: (!) 168/84  Pulse: 66  Resp: 16  Temp: 98 F (36.7 C)  TempSrc: Oral  SpO2: 95%    General appearance: ALERT; in no acute distress.  Head: NCAT Lungs: Normal respiratory effort Musculoskeletal: Back Inspection: Skin warm, dry, clear and intact without obvious erythema, effusion, or ecchymosis.  Palpation: TTP over RT low back; no CVA tenderness ROM: FROM active and passive Strength: 5/5 shld abduction, 5/5 shld adduction, 5/5 elbow flexion, 5/5 elbow extension, 5/5 grip strength, 5/5 knee flexion, 5/5 knee extension Abdomen: soft, nondistended, normal active bowel sounds; nontender to palpation; no guarding  Skin: warm and dry Neurologic: Ambulates without difficulty Psychological: alert and cooperative; normal mood and affect  LABS:  Results for orders placed or performed during the hospital encounter of 10/09/20 (from the past 24 hour(s))  POCT urinalysis dipstick     Status: Abnormal   Collection Time: 10/09/20 10:15 AM  Result Value Ref Range   Color, UA yellow yellow   Clarity, UA clear clear   Glucose, UA negative negative mg/dL   Bilirubin, UA negative negative   Ketones, POC UA negative negative mg/dL   Spec Grav, UA 1.025 1.010 - 1.025   Blood, UA trace-intact (A) negative   pH, UA 6.5 5.0 - 8.0   Protein Ur, POC negative negative mg/dL   Urobilinogen, UA 1.0  0.2 or 1.0 E.U./dL   Nitrite, UA Negative Negative   Leukocytes, UA Negative Negative    ASSESSMENT & PLAN:  1. Right flank pain   2. Hematuria, unspecified type    Meds ordered this encounter  Medications  . ibuprofen (ADVIL) 800 MG tablet    Sig: Take 1 tablet (800 mg total) by mouth 3 (three) times daily.    Dispense:  21 tablet    Refill:  0    Order Specific Question:   Supervising  Provider    Answer:   Raylene Everts [[id number]]  . tamsulosin (FLOMAX) 0.4 MG CAPS capsule    Sig: Take 1 capsule (0.4 mg total) by mouth daily after breakfast.    Dispense:  30 capsule    Refill:  0    Order Specific Question:   Supervising Provider    Answer:   Raylene Everts [[id number]]  . traMADol (ULTRAM) 50 MG tablet    Sig: Take 1 tablet (50 mg total) by mouth every 12 (twelve) hours as needed.    Dispense:  10 tablet    Refill:  0    Order Specific Question:   Supervising Provider    Answer:   Raylene Everts [[id number]]   Toradol shot given in office Urine with trace blood Drink plenty of fluids and get rest Strain all urine and follow up with PCP with specimen Use flomax as directed  Ibuprofen prescribed.   Tramadol for severe break-through pain.  DO NOT TAKE prior to driving or operating heavy machinery as this medications may make you drowsy Follow up with PCP if symptoms persist Return or go to ER if you have any new or worsening symptoms (difficulty urinating, blood in urine, pain that does not moderate with medication, fever, chills, abdominal pain, etc...)   Reviewed expectations re: course of current medical issues. Questions answered. Outlined signs and symptoms indicating need for more acute intervention. Patient verbalized understanding. After Visit Summary given.    Lestine Box, PA-C 10/09/20 1029

## 2020-10-09 NOTE — ED Triage Notes (Signed)
Pt has had rt flank pain for past couple of days. Reports seeing a slim looking substance in his urine today

## 2020-10-11 LAB — URINE CULTURE: Culture: <10000 — AB

## 2020-10-12 NOTE — Progress Notes (Signed)
Phone [phone] In person visit   Subjective:   Alex [name] is a 59 y.o. year old very pleasant male patient who presents for/with See problem oriented charting Chief Complaint  Patient presents with  . Blood in urine follow up   This visit occurred during the SARS-CoV-2 public health emergency.  Safety protocols were in place, including screening questions prior to the visit, additional usage of staff PPE, and extensive cleaning of exam room while observing appropriate contact time as indicated for disinfecting solutions.   Past Medical History-  Patient Active Problem List   Diagnosis Date Noted  . Diabetes mellitus type II, controlled ([place]) 11/11/2010    Priority: High  . Atherosclerosis of aorta ([place]) 10/15/2020    Priority: Medium  . Myalgia due to statin 08/01/2018    Priority: Medium  . Hx of adenomatous polyp of colon 04/28/2017    Priority: Medium  . Hyperlipidemia associated with type 2 diabetes mellitus ([place]) 12/17/2007    Priority: Medium  . Hypertension associated with diabetes ([place]) 12/17/2007    Priority: Medium  . Piriformis syndrome of right side 08/19/2019    Priority: Low  . Bee sting allergy 07/04/2019    Priority: Low  . Situational anxiety 07/22/2010    Priority: Low    Medications- reviewed and updated Current Outpatient Medications  Medication Sig Dispense Refill  . Coenzyme Q10 150 MG CAPS Take 150 capsules by mouth daily.      Marland Kitchen EPINEPHrine 0.3 mg/0.3 mL IJ SOAJ injection Inject 0.3 mLs (0.3 mg total) into the muscle as needed for anaphylaxis. 2 each 1  . glucose blood (ACCU-CHEK AVIVA) test strip Check blood sugar daily as needed. 100 each 3  . glucose blood test strip 1 each by Other route daily. Use as instructed 100 each 12  . halobetasol (ULTRAVATE) 0.05 % cream Apply topically daily.    Marland Kitchen HYDROcodone-acetaminophen (NORCO) 5-325 MG tablet Take 1 tablet by mouth every 4 (four) hours as needed for moderate pain. 10 tablet 0  . ibuprofen  (ADVIL) 800 MG tablet Take 1 tablet (800 mg total) by mouth 3 (three) times daily. 21 tablet 0  . Lancet Devices (ACCU-CHEK SOFTCLIX) lancets 1 each by Other route daily. Use as instructed 1 each 11  . lisinopril (ZESTRIL) 40 MG tablet TAKE 1 TABLET(40 MG) BY MOUTH DAILY 90 tablet 1  . Multiple Vitamin (MULTIVITAMIN) tablet Take 1 tablet by mouth daily.    . Omega-3 Fatty Acids (RA FISH OIL) 1000 MG CAPS Take by mouth.      . ondansetron (ZOFRAN) 4 MG tablet Take 1 tablet (4 mg total) by mouth every 8 (eight) hours as needed for nausea or vomiting. 4 tablet 0  . tamsulosin (FLOMAX) 0.4 MG CAPS capsule Take 1 capsule (0.4 mg total) by mouth daily after breakfast. 30 capsule 0  . traMADol (ULTRAM) 50 MG tablet Take 1 tablet (50 mg total) by mouth every 12 (twelve) hours as needed. 10 tablet 0  . vitamin B-12 (CYANOCOBALAMIN) 500 MCG tablet Take 500 mcg by mouth daily.       No current facility-administered medications for this visit.     Objective:  BP 122/62   Pulse 76   Temp 98.3 F (36.8 C) (Temporal)   Resp 18   Ht 5\' 4"  (1.626 m)   Wt 157 lb 6.4 oz (71.4 kg)   SpO2 93%   BMI 27.02 kg/m  Gen: NAD, resting comfortably CV: RRR no murmurs rubs or gallops Lungs:  CTAB no crackles, wheeze, rhonchi Abdomen: soft/nontender other than perhaps mild lower abdominal tenderness and minimal left-sided flank pain/nondistended/normal bowel sounds. No rebound or guarding.  Ext: no edema Skin: warm, dry     Assessment and Plan   # Left flank pain now resolved due to passing kidney stone # Bilateral kidney stones S: Patient was seen at urgent care October 09, 2020 with right flank pain and noted to have blood on urine and a negative urine culture.  Given Toradol shot in office.  We will started on Flomax (he took through yesterday) and given tramadol for pain.  Back to the emergency room 1 November 16 with 10 out of 10 left flank pain that trended down to 8 out of 10 by the time of evaluation.   Had not been able to take tramadol due to vomiting. CT scan showed 3 mm distal ureteral calculus on the left. He did strain urine after the November 12 visit but he did not see a stone pass until last night. Took sparing hydrocodone. patient mentioned that his urine was a little discolored this morning and perhaps feels some mild tenderness in lower abdomen but otherwise feels much better than he did A/P: Left-sided renal calculus now passed-I think this was the primary cause of left-sided pain.  Patient also with right flank pain prior to that-not sure if that is related to current kidney stones on the right or perhaps a 6th stone that he passed (has 2 remaining stones on the left and 2 remaining stones on the right plus the fifth that he passed).  Considered stone analysis but we are going to try the patient in urology to make sure the appropriate test is ordered.  Referral was placed today.  Patient had already called but was told will be placed on wait list-not sure if we can get him in any sooner -Encouraged him to hold onto stone intake visit and keep his hydrocodone available if needed for recurrent pain with lingering stones -Needs to continue to remain well-hydrated.  Can discontinue Flomax at this time  #Aortic atherosclerosis/hyperlipidemia -new finding on imaging as well as coronary calcium.  We are holding lovastatin due to rash on hands but that has not seemed to help very much yet-we are going to continue to hold for now but discussed at next visit may need to start more potent statin at least moderate dose-perhaps rosuvastatin 10 mg.  Suspect mild poor control off medication but will tolerate for now to see if rash will improve  Recommended follow up:  Keep April visit or sooner if needed Future Appointments  Date Time Provider [place]  03/26/2021 10:40 AM Marin Olp, MD [facility]    Lab/Order associations:   ICD-10-CM   1. Kidney stone  N20.0 Ambulatory referral to  Urology  2. Hyperlipidemia associated with type 2 diabetes mellitus ([place])  E11.69    E78.5   3. Atherosclerosis of aorta ([facility])  I70.0    Time Spent: 32 minutes of total time (10:04 AM- 10:36 AM) was spent on the date of the encounter performing the following actions: chart review prior to seeing the patient, obtaining history, performing a medically necessary exam, counseling on the treatment plan, placing orders, and documenting in our EHR.   Return precautions advised.  Garret Reddish, MD

## 2020-10-13 ENCOUNTER — Emergency Department (HOSPITAL_COMMUNITY): Payer: 59

## 2020-10-13 ENCOUNTER — Emergency Department (HOSPITAL_COMMUNITY)
Admission: EM | Admit: 2020-10-13 | Discharge: 2020-10-13 | Disposition: A | Discharge status: Home / Self Care | Payer: 59 | Attending: Emergency Medicine | Admitting: Emergency Medicine

## 2020-10-13 ENCOUNTER — Encounter (HOSPITAL_COMMUNITY): Payer: Self-pay | Admitting: Emergency Medicine

## 2020-10-13 ENCOUNTER — Other Ambulatory Visit: Payer: Self-pay

## 2020-10-13 DIAGNOSIS — Z79899 Other long term (current) drug therapy: Secondary | ICD-10-CM | POA: Diagnosis not present

## 2020-10-13 DIAGNOSIS — N201 Calculus of ureter: Secondary | ICD-10-CM | POA: Diagnosis not present

## 2020-10-13 DIAGNOSIS — E119 Type 2 diabetes mellitus without complications: Secondary | ICD-10-CM | POA: Diagnosis not present

## 2020-10-13 DIAGNOSIS — I10 Essential (primary) hypertension: Secondary | ICD-10-CM | POA: Insufficient documentation

## 2020-10-13 DIAGNOSIS — R1012 Left upper quadrant pain: Secondary | ICD-10-CM | POA: Diagnosis present

## 2020-10-13 DIAGNOSIS — R031 Nonspecific low blood-pressure reading: Secondary | ICD-10-CM | POA: Insufficient documentation

## 2020-10-13 DIAGNOSIS — Z794 Long term (current) use of insulin: Secondary | ICD-10-CM | POA: Diagnosis not present

## 2020-10-13 LAB — CBC WITH DIFFERENTIAL/PLATELET
Abs Immature Granulocytes: 0.07 10*3/uL (ref 0.00–0.07)
Basophils Absolute: 0 10*3/uL (ref 0.0–0.1)
Basophils Relative: 0 %
Eosinophils Absolute: 0 10*3/uL (ref 0.0–0.5)
Eosinophils Relative: 0 %
HCT: 43.8 % (ref 39.0–52.0)
Hemoglobin: 14.8 g/dL (ref 13.0–17.0)
Immature Granulocytes: 0 %
Lymphocytes Relative: 3 %
Lymphs Abs: 0.6 10*3/uL — ABNORMAL LOW (ref 0.7–4.0)
MCH: 29.5 pg (ref 26.0–34.0)
MCHC: 33.8 g/dL (ref 30.0–36.0)
MCV: 87.3 fL (ref 80.0–100.0)
Monocytes Absolute: 0.5 10*3/uL (ref 0.1–1.0)
Monocytes Relative: 3 %
Neutro Abs: 15.3 10*3/uL — ABNORMAL HIGH (ref 1.7–7.7)
Neutrophils Relative %: 94 %
Platelets: 306 10*3/uL (ref 150–400)
RBC: 5.02 MIL/uL (ref 4.22–5.81)
RDW: 11.6 % (ref 11.5–15.5)
WBC: 16.4 10*3/uL — ABNORMAL HIGH (ref 4.0–10.5)
nRBC: 0 % (ref 0.0–0.2)

## 2020-10-13 LAB — COMPREHENSIVE METABOLIC PANEL
ALT: 24 U/L (ref 0–44)
AST: 28 U/L (ref 15–41)
Albumin: 4.8 g/dL (ref 3.5–5.0)
Alkaline Phosphatase: 82 U/L (ref 38–126)
Anion gap: 12 (ref 5–15)
BUN: 18 mg/dL (ref 8–23)
CO2: 24 mmol/L (ref 22–32)
Calcium: 9.7 mg/dL (ref 8.9–10.3)
Chloride: 98 mmol/L (ref 98–111)
Creatinine, Ser: 0.98 mg/dL (ref 0.61–1.24)
GFR, Estimated: >60 mL/min (ref >60)
Glucose, Bld: 136 mg/dL — ABNORMAL HIGH (ref 70–99)
Potassium: 3.8 mmol/L (ref 3.5–5.1)
Sodium: 134 mmol/L — ABNORMAL LOW (ref 135–145)
Total Bilirubin: 0.8 mg/dL (ref 0.3–1.2)
Total Protein: 8.3 g/dL — ABNORMAL HIGH (ref 6.5–8.1)

## 2020-10-13 LAB — LIPASE, BLOOD: Lipase: 30 U/L (ref 11–51)

## 2020-10-13 MED ORDER — HYDROCODONE-ACETAMINOPHEN 5-325 MG PO TABS: 1.0000 | ORAL_TABLET | ORAL | 0 refills | Status: DC | PRN

## 2020-10-13 MED ORDER — MORPHINE SULFATE (PF) 4 MG/ML IV SOLN
4.0000 mg | Freq: Once | INTRAVENOUS | Status: AC
  Administered 2020-10-13: 4 mg via INTRAVENOUS
  Filled 2020-10-13: qty 1

## 2020-10-13 MED ORDER — ONDANSETRON HCL 4 MG/2ML IJ SOLN
4.0000 mg | Freq: Once | INTRAMUSCULAR | Status: AC
  Administered 2020-10-13: 4 mg via INTRAVENOUS
  Filled 2020-10-13: qty 2

## 2020-10-13 MED ORDER — ONDANSETRON HCL 4 MG PO TABS: 4.0000 mg | ORAL_TABLET | Freq: Four times a day (QID) | ORAL | 0 refills | Status: DC | PRN

## 2020-10-13 MED ORDER — ONDANSETRON HCL 4 MG PO TABS: 4.0000 mg | ORAL_TABLET | Freq: Three times a day (TID) | ORAL | 0 refills | Status: DC | PRN

## 2020-10-13 NOTE — Discharge Instructions (Signed)
Drink plenty of fluids.  Return if pain is not being adequately controlled, or if you start running a fever.

## 2020-10-13 NOTE — ED Notes (Signed)
Assumed care of pt at 0700

## 2020-10-13 NOTE — ED Provider Notes (Signed)
[facility] EMERGENCY DEPARTMENT Provider Note   CSN: [id number] Arrival date & time: 10/13/20  0435   History Chief Complaint  Patient presents with  . Flank Pain    Alex [name] is a 59 y.o. male.  The history is provided by the patient.  Flank Pain  He has history of hypertension, diabetes, hyperlipidemia and comes in because of left flank pain since 11:15 PM.  Pain is severe and was rated at 10/10.  It was associated with nausea and vomiting.  There was some radiation of pain to the left upper abdomen but not to the left lower abdomen or groin.  Pain is subsided to where it is now 8/10.  Nothing made the pain better, nothing made it worse.  He denies fever, chills, sweats.  He denies any urinary difficulty.  He had been seen at urgent care several days ago for pain in the right flank.  He had been given a prescription for tramadol, but was not able to take it tonight because of vomiting.  Past Medical History:  Diagnosis Date  . Arthritis   . Diabetes mellitus    diet controlled  . Hx of adenomatous polyp of colon 04/28/2017  . Mixed hyperlipidemia   . Nonspecific elevation of levels of transaminase or lactic acid dehydrogenase (LDH)   . Unspecified essential hypertension     Patient Active Problem List   Diagnosis Date Noted  . Piriformis syndrome of right side 08/19/2019  . Bee sting allergy 07/04/2019  . Myalgia due to statin 08/01/2018  . Hx of adenomatous polyp of colon 04/28/2017  . Diabetes mellitus type II, controlled ([place]) 11/11/2010  . Situational anxiety 07/22/2010  . Hyperlipidemia associated with type 2 diabetes mellitus ([place]) 12/17/2007  . Hypertension associated with diabetes ([place]) 12/17/2007    Past Surgical History:  Procedure Laterality Date  . APPENDECTOMY  1965       Family History  Problem Relation Age of Onset  . COPD Mother   . Emphysema Mother   . Heart disease Father        heavy smoker, 5  . Coronary artery disease Father   .  Heart attack Father   . Colon cancer Neg Hx     Social History   Tobacco Use  . Smoking status: Never Smoker  . Smokeless tobacco: Never Used  Substance Use Topics  . Alcohol use: Yes    Alcohol/week: 1.0 standard drink    Types: 1 Standard drinks or equivalent per week  . Drug use: No    Home Medications Prior to Admission medications   Medication Sig Start Date End Date Taking? Authorizing Provider  Coenzyme Q10 150 MG CAPS Take 150 capsules by mouth daily.      [provider]  EPINEPHrine 0.3 mg/0.3 mL IJ SOAJ injection Inject 0.3 mLs (0.3 mg total) into the muscle as needed for anaphylaxis. 07/04/19   Marin Olp, MD  glucose blood (ACCU-CHEK AVIVA) test strip Check blood sugar daily as needed. 07/24/15   Marin Olp, MD  glucose blood test strip 1 each by Other route daily. Use as instructed 05/15/14   Swords, Darrick Penna, MD  halobetasol (ULTRAVATE) 0.05 % cream Apply topically daily. 03/30/20   [provider]  ibuprofen (ADVIL) 800 MG tablet Take 1 tablet (800 mg total) by mouth 3 (three) times daily. 10/09/20   Wurst, [place], PA-C  Lancet Devices (ACCU-CHEK [place]) lancets 1 each by Other route daily. Use as instructed 05/15/14  Swords, Darrick Penna, MD  lisinopril (ZESTRIL) 40 MG tablet TAKE 1 TABLET(40 MG) BY MOUTH DAILY 07/02/20   Marin Olp, MD  Multiple Vitamin (MULTIVITAMIN) tablet Take 1 tablet by mouth daily.    [provider]  Omega-3 Fatty Acids (RA FISH OIL) 1000 MG CAPS Take by mouth.      [provider]  tamsulosin (FLOMAX) 0.4 MG CAPS capsule Take 1 capsule (0.4 mg total) by mouth daily after breakfast. 10/09/20   Wurst, [place], PA-C  traMADol (ULTRAM) 50 MG tablet Take 1 tablet (50 mg total) by mouth every 12 (twelve) hours as needed. 10/09/20   Wurst, [place], PA-C  vitamin B-12 (CYANOCOBALAMIN) 500 MCG tablet Take 500 mcg by mouth daily.      [provider]    Allergies    Amlodipine  Review of  Systems   Review of Systems  Genitourinary: Positive for flank pain.  All other systems reviewed and are negative.   Physical Exam Updated Vital Signs BP (!) 178/93 (BP Location: Left Arm)   Pulse 78   Temp 98.7 F (37.1 C) (Oral)   Resp 18   Ht 5\' 4"  (1.626 m)   Wt 73 kg   SpO2 95%   BMI 27.62 kg/m   Physical Exam Vitals and nursing note reviewed.   59 year old male, resting comfortably and in no acute distress. Vital signs are significant for elevated blood pressure. Oxygen saturation is 95%, which is normal. Head is normocephalic and atraumatic. PERRLA, EOMI. Oropharynx is clear. Neck is nontender and supple without adenopathy or JVD. Back is nontender in the midline.  There is moderate left CVA tenderness. Lungs are clear without rales, wheezes, or rhonchi. Chest is nontender. Heart has regular rate and rhythm without murmur. Abdomen is soft, flat, nontender without masses or hepatosplenomegaly and peristalsis is hypoactive. Extremities have no cyanosis or edema, full range of motion is present. Skin is warm and dry without rash. Neurologic: Mental status is normal, cranial nerves are intact, there are no motor or sensory deficits.  ED Results / Procedures / Treatments   Labs (all labs ordered are listed, but only abnormal results are displayed) Labs Reviewed  COMPREHENSIVE METABOLIC PANEL - Abnormal; Notable for the following components:      Result Value   Sodium 134 (*)    Glucose, Bld 136 (*)    Total Protein 8.3 (*)    All other components within normal limits  LIPASE, BLOOD  CBC WITH DIFFERENTIAL/PLATELET  URINALYSIS, ROUTINE W REFLEX MICROSCOPIC  CBC WITH DIFFERENTIAL/PLATELET    Radiology CT Renal Stone Study  Result Date: 10/13/2020 CLINICAL DATA:  Left-sided flank pain EXAM: CT ABDOMEN AND PELVIS WITHOUT CONTRAST TECHNIQUE: Multidetector CT imaging of the abdomen and pelvis was performed following the standard protocol without IV contrast.  COMPARISON:  None. FINDINGS: Lower chest: Mild atelectatic type density at the bases. Coronary artery calcification. Hepatobiliary: No focal liver abnormality.No evidence of biliary obstruction or stone. Pancreas: Unremarkable. Spleen: Unremarkable. Adrenals/Urinary Tract: Negative adrenals. Left hydroureteronephrosis and perinephric stranding with a 3 mm stone at the UVJ. Two punctate right renal calculi on reformats. Two punctate left upper pole calculi. Unremarkable bladder. Stomach/Bowel:  No obstruction. No visible bowel inflammation. Vascular/Lymphatic: No acute vascular abnormality. Diffuse atheromatous calcification of the aorta. No mass or adenopathy. Reproductive:No pathologic findings. Other: No ascites or pneumoperitoneum. Musculoskeletal: No acute abnormalities. Generalized disc degeneration. IMPRESSION: 1. Obstructing 3 mm left UVJ calculus. 2. Punctate bilateral renal calculi. 3.  Aortic Atherosclerosis (  ICD10-I70.0). Electronically Signed   By: Monte Fantasia M.D.   On: 10/13/2020 06:13    Procedures Procedures  Medications Ordered in ED Medications  ondansetron (ZOFRAN) injection 4 mg (4 mg Intravenous Given 10/13/20 0540)  morphine 4 MG/ML injection 4 mg (4 mg Intravenous Given 10/13/20 0540)    ED Course  I have reviewed the triage vital signs and the nursing notes.  Pertinent labs & imaging results that were available during my care of the patient were reviewed by me and considered in my medical decision making (see chart for details).  MDM Rules/Calculators/A&P Left flank pain suspicious for ureteral colic.  Differential diagnosis includes pyelonephritis, diverticulitis, abdominal aortic aneurysm.  Differential includes conditions with significant morbidity and mortality.  Old records reviewed confirming recent urgent care visit at which time urine dipstick was trace positive for red blood cells.  Last abdominal imaging on record was an ultrasound in 2008 at which time there  was no aneurysm and kidneys were reported to be normal.  Will check screening labs and send for renal stone protocol CT scan.  CT scan shows 3 mm distal left ureteral calculus.  I suspect that he had passed a right ureteral calculus several days ago.  He has a prescription for tamsulosin which was given to him from urgent care.  He is given prescriptions for ondansetron and hydrocodone-acetaminophen.  He is referred to urology for follow-up.  Return precautions discussed.  Final Clinical Impression(s) / ED Diagnoses Final diagnoses:  Ureterolithiasis  Elevated blood pressure reading with diagnosis of hypertension    Rx / DC Orders ED Discharge Orders         Ordered    HYDROcodone-acetaminophen (NORCO) 5-325 MG tablet  Every 4 hours PRN        10/13/20 0653    ondansetron (ZOFRAN) 4 MG tablet  Every 8 hours PRN        10/13/20 0653    ondansetron (ZOFRAN) 4 MG tablet  Every 6 hours PRN        10/13/20 0653    HYDROcodone-acetaminophen (NORCO) 5-325 MG tablet  Every 4 hours PRN        10/13/20 3212           Delora Fuel, MD 24/82/50 [phone]

## 2020-10-13 NOTE — ED Triage Notes (Signed)
Pt seen at Urgent care on 10/09/20 for c/o flank pain. Prescribed several meds but hasn't "really been taking them". Pt returns tonight with worsening L sided flank pain.

## 2020-10-15 ENCOUNTER — Encounter: Payer: Self-pay | Admitting: Family Medicine

## 2020-10-15 ENCOUNTER — Ambulatory Visit: Payer: 59 | Admitting: Family Medicine

## 2020-10-15 ENCOUNTER — Other Ambulatory Visit: Payer: Self-pay

## 2020-10-15 VITALS — BP 122/62 | HR 76 | Temp 98.3°F | Resp 18 | Ht 64.0 in | Wt 157.4 lb

## 2020-10-15 DIAGNOSIS — E1169 Type 2 diabetes mellitus with other specified complication: Secondary | ICD-10-CM

## 2020-10-15 DIAGNOSIS — E785 Hyperlipidemia, unspecified: Secondary | ICD-10-CM | POA: Diagnosis not present

## 2020-10-15 DIAGNOSIS — I700 Atherosclerosis of aorta: Secondary | ICD-10-CM

## 2020-10-15 DIAGNOSIS — N200 Calculus of kidney: Secondary | ICD-10-CM

## 2020-10-15 NOTE — Patient Instructions (Addendum)
Health Maintenance Due  Topic Date Due  . OPHTHALMOLOGY EXAM Sign release to get his recent eye exam.  08/04/2019   We will call you within two weeks about your referral to urology. If you do not hear within 3 weeks, give [place] a call.   Take your stone to urology when you go.   Stay off lovastatin for now but likely need to restart a medicine next visit. Follow up with Dr. Denna Haggard to see next steps  Recommended follow up: as needed- otherwise keep April visit

## 2020-11-12 ENCOUNTER — Other Ambulatory Visit: Payer: Self-pay

## 2020-11-12 ENCOUNTER — Ambulatory Visit
Admission: EM | Admit: 2020-11-12 | Discharge: 2020-11-12 | Disposition: A | Discharge status: Home / Self Care | Payer: 59 | Attending: Internal Medicine | Admitting: Internal Medicine

## 2020-11-12 DIAGNOSIS — J0110 Acute frontal sinusitis, unspecified: Secondary | ICD-10-CM | POA: Diagnosis not present

## 2020-11-12 MED ORDER — GUAIFENESIN ER 600 MG PO TB12: 600.0000 mg | ORAL_TABLET | Freq: Two times a day (BID) | ORAL | Status: DC

## 2020-11-12 MED ORDER — FLUTICASONE PROPIONATE 50 MCG/ACT NA SUSP: 1.0000 | Freq: Every day | NASAL | 0 refills | Status: DC

## 2020-11-12 NOTE — ED Triage Notes (Signed)
Pt presents with nasal pressure and sinus drainage just on the right side of face that began last night

## 2020-11-12 NOTE — ED Provider Notes (Signed)
[facility]    CSN: [id number] Arrival date & time: 11/12/20  0803      History   Chief Complaint Chief Complaint  Patient presents with  . Nasal Congestion    HPI Alex [name] is a 59 y.o. male comes in with a days history of right nasal congestion, right-sided headaches and clear rhinorrhea.  Symptoms started last night.  Patient used Sudafed with partial improvement.  She comes to the urgent care to be evaluated further.  He denies any body aches.  No sick contacts.  No sore throat, nausea or vomiting.  No history of seasonal allergies or asthma.   HPI  Past Medical History:  Diagnosis Date  . Arthritis   . Diabetes mellitus    diet controlled  . Hx of adenomatous polyp of colon 04/28/2017  . Mixed hyperlipidemia   . Nonspecific elevation of levels of transaminase or lactic acid dehydrogenase (LDH)   . Unspecified essential hypertension     Patient Active Problem List   Diagnosis Date Noted  . Atherosclerosis of aorta ([place]) 10/15/2020  . Piriformis syndrome of right side 08/19/2019  . Bee sting allergy 07/04/2019  . Myalgia due to statin 08/01/2018  . Hx of adenomatous polyp of colon 04/28/2017  . Diabetes mellitus type II, controlled ([place]) 11/11/2010  . Situational anxiety 07/22/2010  . Hyperlipidemia associated with type 2 diabetes mellitus ([place]) 12/17/2007  . Hypertension associated with diabetes ([place]) 12/17/2007    Past Surgical History:  Procedure Laterality Date  . APPENDECTOMY  1965       Home Medications    Prior to Admission medications   Medication Sig Start Date End Date Taking? Authorizing Provider  Coenzyme Q10 150 MG CAPS Take 150 capsules by mouth daily.      [provider]  EPINEPHrine 0.3 mg/0.3 mL IJ SOAJ injection Inject 0.3 mLs (0.3 mg total) into the muscle as needed for anaphylaxis. 07/04/19   Marin Olp, MD  fluticasone (FLONASE) 50 MCG/ACT nasal spray Place 1 spray into both nostrils daily. 11/12/20    LampteyMyrene Galas, MD  glucose blood (ACCU-CHEK AVIVA) test strip Check blood sugar daily as needed. 07/24/15   Marin Olp, MD  glucose blood test strip 1 each by Other route daily. Use as instructed 05/15/14   Swords, Darrick Penna, MD  guaiFENesin (MUCINEX) 600 MG 12 hr tablet Take 1 tablet (600 mg total) by mouth 2 (two) times daily. 11/12/20   LampteyMyrene Galas, MD  halobetasol (ULTRAVATE) 0.05 % cream Apply topically daily. 03/30/20   [provider]  HYDROcodone-acetaminophen (NORCO) 5-325 MG tablet Take 1 tablet by mouth every 4 (four) hours as needed for moderate pain. 70/96/28   Delora Fuel, MD  ibuprofen (ADVIL) 800 MG tablet Take 1 tablet (800 mg total) by mouth 3 (three) times daily. 10/09/20   Wurst, [place], PA-C  Lancet Devices (ACCU-CHEK [place]) lancets 1 each by Other route daily. Use as instructed 05/15/14   Swords, Darrick Penna, MD  lisinopril (ZESTRIL) 40 MG tablet TAKE 1 TABLET(40 MG) BY MOUTH DAILY 07/02/20   Marin Olp, MD  Multiple Vitamin (MULTIVITAMIN) tablet Take 1 tablet by mouth daily.    [provider]  Omega-3 Fatty Acids (RA FISH OIL) 1000 MG CAPS Take by mouth.      [provider]  ondansetron (ZOFRAN) 4 MG tablet Take 1 tablet (4 mg total) by mouth every 8 (eight) hours as needed for nausea or vomiting. 10/13/20  Delora Fuel, MD  tamsulosin [facility]) 0.4 MG CAPS capsule Take 1 capsule (0.4 mg total) by mouth daily after breakfast. 10/09/20   Wurst, [place], PA-C  traMADol (ULTRAM) 50 MG tablet Take 1 tablet (50 mg total) by mouth every 12 (twelve) hours as needed. 10/09/20   Wurst, [place], PA-C  vitamin B-12 (CYANOCOBALAMIN) 500 MCG tablet Take 500 mcg by mouth daily.      [provider]    Family History Family History  Problem Relation Age of Onset  . COPD Mother   . Emphysema Mother   . Heart disease Father        heavy smoker, 4  . Coronary artery disease Father   . Heart attack Father   . Colon cancer Neg Hx      Social History Social History   Tobacco Use  . Smoking status: Never Smoker  . Smokeless tobacco: Never Used  Substance Use Topics  . Alcohol use: Yes    Alcohol/week: 1.0 standard drink    Types: 1 Standard drinks or equivalent per week  . Drug use: No     Allergies   Amlodipine   Review of Systems Review of Systems  Constitutional: Negative.   HENT: Positive for congestion, rhinorrhea, sinus pressure and sinus pain. Negative for postnasal drip and sore throat.   Respiratory: Negative for cough, shortness of breath and wheezing.      Physical Exam Triage Vital Signs ED Triage Vitals  Enc Vitals Group     BP 11/12/20 0824 (!) 146/88     Pulse Rate 11/12/20 0824 69     Resp 11/12/20 0824 18     Temp 11/12/20 0824 98.9 F (37.2 C)     Temp src --      SpO2 11/12/20 0824 98 %     Weight --      Height --      Head Circumference --      Peak Flow --      Pain Score 11/12/20 0823 5     Pain Loc --      Pain Edu? --      Excl. in [place]? --    No data found.  Updated Vital Signs BP (!) 146/88   Pulse 69   Temp 98.9 F (37.2 C)   Resp 18   SpO2 98%   Visual Acuity Right Eye Distance:   Left Eye Distance:   Bilateral Distance:    Right Eye Near:   Left Eye Near:    Bilateral Near:     Physical Exam   UC Treatments / Results  Labs (all labs ordered are listed, but only abnormal results are displayed) Labs Reviewed - No data to display  EKG   Radiology No results found.  Procedures Procedures (including critical care time)  Medications Ordered in UC Medications - No data to display  Initial Impression / Assessment and Plan / UC Course  I have reviewed the triage vital signs and the nursing notes.  Pertinent labs & imaging results that were available during my care of the patient were reviewed by me and considered in my medical decision making (see chart for details).     1.  Acute frontal sinusitis: Over-the-counter measures with  fluticasone nasal spray, Mucinex and saline nasal spray No indication for antibiotics If patient symptoms are persistent he is advised to return to urgent care to be reevaluated. Final Clinical Impressions(s) / UC Diagnoses   Final diagnoses:  Acute frontal sinusitis,  recurrence not specified     Discharge Instructions     Please use over-the-counter fluticasone nasal spray Do not use Sudafed more than a couple of days Mucinex as needed Humidifier use at home will be helpful Follow-up with primary care physician.   ED Prescriptions    Medication Sig Dispense Auth. Provider   fluticasone (FLONASE) 50 MCG/ACT nasal spray Place 1 spray into both nostrils daily. 16 g Chase Picket, MD   guaiFENesin (MUCINEX) 600 MG 12 hr tablet Take 1 tablet (600 mg total) by mouth 2 (two) times daily.  Yves Fodor, Myrene Galas, MD     PDMP not reviewed this encounter.   Chase Picket, MD 11/12/20 [phone]

## 2020-11-12 NOTE — Discharge Instructions (Signed)
Please use over-the-counter fluticasone nasal spray Do not use Sudafed more than a couple of days Mucinex as needed Humidifier use at home will be helpful Follow-up with primary care physician.

## 2020-12-16 ENCOUNTER — Other Ambulatory Visit: Payer: Self-pay

## 2020-12-16 MED ORDER — LISINOPRIL 40 MG PO TABS
40.0000 mg | ORAL_TABLET | Freq: Every day | ORAL | 1 refills | Status: DC
Start: 2020-12-16 — End: 2021-07-07

## 2021-02-24 ENCOUNTER — Other Ambulatory Visit: Payer: Self-pay

## 2021-02-24 ENCOUNTER — Ambulatory Visit
Admission: EM | Admit: 2021-02-24 | Discharge: 2021-02-24 | Disposition: A | Discharge status: Home / Self Care | Payer: 59 | Attending: Family Medicine | Admitting: Family Medicine

## 2021-02-24 ENCOUNTER — Encounter: Payer: Self-pay | Admitting: Emergency Medicine

## 2021-02-24 DIAGNOSIS — J069 Acute upper respiratory infection, unspecified: Secondary | ICD-10-CM

## 2021-02-24 DIAGNOSIS — I10 Essential (primary) hypertension: Secondary | ICD-10-CM

## 2021-02-24 NOTE — ED Provider Notes (Signed)
[place]   [id number] 02/24/21 Arrival Time: 0802  ASSESSMENT & PLAN:  1. Viral URI with cough   2. Elevated blood pressure reading in office with diagnosis of hypertension     Likely viral +/- seasonal allergies. Has Flonase at home. AFRIN at night; 2-3 d max. OTC as needed.   Follow-up Information    Marin Olp, MD.   Specialty: Family Medicine Why: As needed. Contact information: [place] [address] [facility] [place].   Specialty: Urgent Care Why: If worsening or failing to improve as anticipated. Contact information: [address], [place] [address] [phone]               Discharge Instructions      Your blood pressure was noted to be elevated during your visit today. If you are currently taking medication for high blood pressure, please ensure you are taking this as directed. If you do not have a history of high blood pressure and your blood pressure remains persistently elevated, you may need to begin taking a medication at some point. You may return here within the next few days to recheck if unable to see your primary care provider or if you do not have a one.  BP (!) 151/91 (BP Location: Right Arm)   Pulse 77   Temp 99 F (37.2 C) (Oral)   Resp 18   SpO2 96%   BP Readings from Last 3 Encounters:  02/24/21 (!) 151/91  11/12/20 (!) 146/88  10/15/20 122/62        Reviewed expectations re: course of current medical issues. Questions answered. Outlined signs and symptoms indicating need for more acute intervention. Patient verbalized understanding. After Visit Summary given.   SUBJECTIVE: History from: patient.  Alex [name] is a 60 y.o. male who reports nasal congestion, post-nasal drainage, sinus pressure, and coughing. Abrupt approx 3-4 d ago; not worsening. Afebrile. No SOB/wheezing.  Social History   Tobacco Use   Smoking Status Never Smoker  Smokeless Tobacco Never Used   Increased blood pressure noted today. Reports that he is treated for HTN. He reports no symptoms.  OBJECTIVE:  Vitals:   02/24/21 0811  BP: (!) 151/91  Pulse: 77  Resp: 18  Temp: 99 F (37.2 C)  TempSrc: Oral  SpO2: 96%     General appearance: alert; no distress HEENT: nasal congestion; clear runny nose; throat irritation secondary to post-nasal drainage; mild mas sinus TTP Neck: supple without LAD; trachea midline Lungs: unlabored respirations, symmetrical air entry; cough: mild, dry; no respiratory distress Skin: warm and dry Psychological: alert and cooperative; normal mood and affect  Allergies  Allergen Reactions  . Amlodipine     gingival hyperplasia     Past Medical History:  Diagnosis Date  . Arthritis   . Diabetes mellitus    diet controlled  . Hx of adenomatous polyp of colon 04/28/2017  . Mixed hyperlipidemia   . Nonspecific elevation of levels of transaminase or lactic acid dehydrogenase (LDH)   . Unspecified essential hypertension    Family History  Problem Relation Age of Onset  . COPD Mother   . Emphysema Mother   . Heart disease Father        heavy smoker, 59  . Coronary artery disease Father   . Heart attack Father   . Colon cancer Neg Hx    Social History   Socioeconomic  History  . Marital status: Married    Spouse name: Not on file  . Number of children: Not on file  . Years of education: Not on file  . Highest education level: Not on file  Occupational History  . Not on file  Tobacco Use  . Smoking status: Never Smoker  . Smokeless tobacco: Never Used  Substance and Sexual Activity  . Alcohol use: Yes    Alcohol/week: 1.0 standard drink    Types: 1 Standard drinks or equivalent per week  . Drug use: No  . Sexual activity: Not on file  Other Topics Concern  . Not on file  Social History Narrative   Married 2007. No kids. 3 yorkies.       [employer]- assembly  work. 5 days a week now.    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

## 2021-02-24 NOTE — ED Triage Notes (Signed)
Sinus congestion and productive cough - green sputum.  Symptoms became worse over the weekend.

## 2021-02-24 NOTE — Discharge Instructions (Addendum)
Your blood pressure was noted to be elevated during your visit today. If you are currently taking medication for high blood pressure, please ensure you are taking this as directed. If you do not have a history of high blood pressure and your blood pressure remains persistently elevated, you may need to begin taking a medication at some point. You may return here within the next few days to recheck if unable to see your primary care provider or if you do not have a one.  BP (!) 151/91 (BP Location: Right Arm)   Pulse 77   Temp 99 F (37.2 C) (Oral)   Resp 18   SpO2 96%   BP Readings from Last 3 Encounters:  02/24/21 (!) 151/91  11/12/20 (!) 146/88  10/15/20 122/62

## 2021-03-25 NOTE — Patient Instructions (Addendum)
Please stop by lab before you go If you have mychart- we will send your results within 3 business days of [place] receiving them.  If you do not have mychart- we will call you about results within 5 business days of [place] receiving them.  *please also note that you will see labs on mychart as soon as they post. I will later go in and write notes on them- will say "notes from Dr. Yong Channel"  If you get 3rd covid vaccine- please update [place] on mychart with date  Check with insurance about prevnar 20- if they will cover here happy to give to you or can check with pharmacy. Also let us know if you get at pharmacy  Focus on healthy eating/regular exercise to try to bring blood pressure down- cloesr follow up due to this  We will call you within two weeks about your referral to [place] with Dr. Junius Roads. If you do not hear within 2 weeks, give [place] a call.   Recommended follow up: Return in about 14 weeks (around 07/02/2021) for follow up- or sooner if needed.

## 2021-03-25 NOTE — Progress Notes (Signed)
Phone: [phone]   Subjective:  Patient presents today for their annual physical. Chief complaint-noted.   See problem oriented charting- ROS- full  review of systems was completed and negative  except for: back pain, muscle aches, neck pain, allergies, some weakness compared to years past- no focal weakness- he avoids heavy lifting to protect his back  The following were reviewed and entered/updated in epic: Past Medical History:  Diagnosis Date  . Arthritis   . Diabetes mellitus    diet controlled  . Hx of adenomatous polyp of colon 04/28/2017  . Mixed hyperlipidemia   . Nonspecific elevation of levels of transaminase or lactic acid dehydrogenase (LDH)   . Unspecified essential hypertension    Patient Active Problem List   Diagnosis Date Noted  . Diabetes mellitus type II, controlled ([place]) 11/11/2010    Priority: High  . Atherosclerosis of aorta ([place]) 10/15/2020    Priority: Medium  . Myalgia due to statin 08/01/2018    Priority: Medium  . Hx of adenomatous polyp of colon 04/28/2017    Priority: Medium  . Hyperlipidemia associated with type 2 diabetes mellitus ([place]) 12/17/2007    Priority: Medium  . Hypertension associated with diabetes ([place]) 12/17/2007    Priority: Medium  . Piriformis syndrome of right side 08/19/2019    Priority: Low  . Bee sting allergy 07/04/2019    Priority: Low  . Situational anxiety 07/22/2010    Priority: Low   Past Surgical History:  Procedure Laterality Date  . APPENDECTOMY  1965    Family History  Problem Relation Age of Onset  . COPD Mother   . Emphysema Mother   . Heart disease Father        heavy smoker, 55  . Coronary artery disease Father   . Heart attack Father   . Colon cancer Neg Hx     Medications- reviewed and updated Current Outpatient Medications  Medication Sig Dispense Refill  . Coenzyme Q10 150 MG CAPS Take 150 capsules by mouth daily.    Marland Kitchen EPINEPHrine 0.3 mg/0.3 mL IJ SOAJ injection Inject 0.3 mLs (0.3 mg  total) into the muscle as needed for anaphylaxis. 2 each 1  . glucose blood (ACCU-CHEK AVIVA) test strip Check blood sugar daily as needed. 100 each 3  . glucose blood test strip 1 each by Other route daily. Use as instructed 100 each 12  . Lancet Devices (ACCU-CHEK [place]) lancets 1 each by Other route daily. Use as instructed 1 each 11  . lisinopril (ZESTRIL) 40 MG tablet Take 1 tablet (40 mg total) by mouth daily. 90 tablet 1  . Multiple Vitamin (MULTIVITAMIN) tablet Take 1 tablet by mouth daily.    . Omega-3 Fatty Acids (RA FISH OIL) 1000 MG CAPS Take by mouth.    . tamsulosin (FLOMAX) 0.4 MG CAPS capsule Take 1 capsule (0.4 mg total) by mouth daily after breakfast. 30 capsule 0  . vitamin B-12 (CYANOCOBALAMIN) 500 MCG tablet Take 500 mcg by mouth daily.    . fluticasone (FLONASE) 50 MCG/ACT nasal spray Place 2 sprays into both nostrils daily. 16 g 5  . guaiFENesin (MUCINEX) 600 MG 12 hr tablet Take 1 tablet (600 mg total) by mouth 2 (two) times daily. (Patient not taking: Reported on 03/26/2021)    . halobetasol (ULTRAVATE) 0.05 % cream Apply topically daily. (Patient not taking: Reported on 03/26/2021)     No current facility-administered medications for this visit.    Allergies-reviewed and updated Allergies  Allergen Reactions  . Amlodipine  gingival hyperplasia     Social History   Social History Narrative   Married 2007. No kids. 3 yorkies.       [employer]- assembly work. 5 days a week now.    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 Oropharynx normal Neck: no thyromegaly CV: RRR no murmurs rubs or gallops Lungs: CTAB  no crackles, wheeze, rhonchi Abdomen: soft/nontender/nondistended/normal bowel sounds. No rebound or guarding.  Ext: no edema Skin: warm, dry Neuro: grossly normal, moves all extremities, PERRLA   Diabetic Foot Exam - Simple   Simple Foot Form Diabetic Foot exam was performed with the following findings: Yes 03/26/2021 10:59 AM  Visual Inspection No deformities, no ulcerations, no other skin breakdown bilaterally: Yes Sensation Testing Intact to touch and monofilament testing bilaterally: Yes Pulse Check Posterior Tibialis and Dorsalis pulse intact bilaterally: Yes Comments        Assessment and Plan  60 y.o. male presenting for annual physical.  Health Maintenance counseling: 1. Anticipatory guidance: Patient counseled regarding regular dental exams -every 3 months due to bone loss, eye exams -yearly,  avoiding smoking and second hand smoke , limiting alcohol to 2 beverages per day -once a month maximum o rless.   2. Risk factor reduction:  Advised patient of need for regular exercise and diet rich and fruits and vegetables to reduce risk of heart attack and stroke. Exercise- limited by work hours- does not do much outside of work- wants to work on this as he cuts down to 3 days a week. Diet-patient plans to cut down on processed foods- plans to even make his own bread, do more fresh veggies. wants to work on weight loss Wt Readings from Last 3 Encounters:  03/26/21 161 lb 3.2 oz (73.1 kg)  10/15/20 157 lb 6.4 oz (71.4 kg)  10/13/20 160 lb 15 oz (73 kg)  3. Immunizations/screenings/ancillary studies-discussed third COVID-19 vaccination- has had illness each time he has gone to get it, discussed Prevnar 20 at pharmacy as not 100% clear if covered in office yet Immunization History  Administered Date(s) Administered  . Influenza,inj,Quad PF,6+ Mos 11/01/2013, 01/01/2019, 09/25/2020  . Moderna Sars-Covid-2 Vaccination 02/06/2020, 03/07/2020  . Pneumococcal Conjugate-13 11/01/2013  .  Pneumococcal Polysaccharide-23 08/01/2018  . Td 11/28/2006  . Tdap 03/03/2017  . Zoster Recombinat (Shingrix) 01/01/2019, 04/04/2019   4. Prostate cancer screening- low risk prior PSA trend-continue to trend with labs Lab Results  Component Value Date   PSA 1.46 01/06/2020   PSA 1.25 01/01/2019   PSA 1.32 02/01/2017   5. Colon cancer screening - 04/21/17 with 5 year repeat- Dr. Carlean Purl 6. Skin cancer screening-referred back to dermatology due to ongoing rash last year- that's doing better. Has also done full skin exam. advised regular sunscreen use. Denies worrisome, changing, or new skin lesions.  7.  Never smoker 8. STD screening - not needed as only active with wife and no concern for infidelity  Status of chronic or acute concerns   # social update- going down to 3 days a week starting later this year  #Diabetes  mellitus-with hypertension and hyperlipidemia S: Patient has been diet and exercise controlled. Lab Results  Component Value Date   HGBA1C 5.6 09/25/2020   HGBA1C 6.1 01/06/2020   HGBA1C 5.6 07/04/2019   A/P: Hopefully remains controlled-update A1c with labs today   #Hyperlipidemia associated with diabetes/statin myalgia #aortic atherosclerosis on x-ray 02/04/16 lumbar spine S: Patient has been intolerant to statin due to myalgias in the past even atorvastatin 40 mg once a week.  Patient willing to trial lovastatin 10 mg once a week-we took him off due to rash on hands following with dermatology.  He does take fish oil Lab Results  Component Value Date   CHOL 218 (H) 01/06/2020   HDL 38.50 (L) 01/06/2020   LDLCALC 145 (H) 01/06/2020   LDLDIRECT 148.0 01/01/2019   TRIG 174.0 (H) 01/06/2020   CHOLHDL 6 01/06/2020   A/P:  Rash on hands much better- still seeing dermatology. We will check lipids- consider restarting lovastatin as long as rash doesn't worsen    #Hypertension associated with diabetes S: Compliant with lisinopril 40 mg -Off calcium channel blocker due to  gingival hyperplasia - home #s 135-145 at home along with 80s on bottom BP Readings from Last 3 Encounters:  03/26/21 (!) 144/84  02/24/21 (!) 151/91  11/12/20 (!) 146/88   A/P: Mildly elevated initially today and slightly high at home . He wants to trial improving diet/exercise before starting new med- we agreed to 14 week follow up and if not improving consider another med at that time  #Neck pain/lower back pain S: Patient reports that moving neck quickly from left to right or right to left causes pain. Some cracks and pops with this  Also has pain when bending over in the lower back, also lateral hip pain as well and sometimes into the leg . X-ray 2017 showed DDD of L5-s1 .  A/P:  Has seen Dr. Charlann Boxer and chiropractors in past- would like a non chiropractic approach- will refer to Dr. Junius Roads of sports medicine- patient wants to discuss updating imaging that may be helpful  #Allergies-managed with Flonase for most part. Refill today   Recommended follow up: Return in about 14 weeks (around 07/02/2021) for follow up- or sooner if needed.  Lab/Order associations: fasting   ICD-10-CM   1. Preventative health care  Z00.00 CBC with Differential/Platelet    Comprehensive metabolic panel    Lipid panel  2. Hypertension associated with diabetes ([place])  E11.59    I15.2   3. Hyperlipidemia associated with type 2 diabetes mellitus (HCC)  E11.69 CBC with Differential/Platelet   E78.5 Comprehensive metabolic panel    Lipid panel  4. Controlled type 2 diabetes mellitus without complication, without long-term current use of insulin ([facility])  E11.9 Hemoglobin A1c  5. Situational anxiety  F41.8   6. Atherosclerosis of aorta ([facility])  I70.0   7. Neck pain  M54.2 Ambulatory referral to Orthopedics  8. Chronic bilateral low back pain, unspecified whether sciatica present  M54.50 Ambulatory referral to Orthopedics   G89.29   9. Screening for prostate cancer  Z12.5 PSA    Meds ordered this encounter   Medications  . fluticasone (FLONASE) 50 MCG/ACT nasal spray    Sig: Place 2 sprays into both nostrils daily.    Dispense:  16 g    Refill:  5    Return precautions advised.  Garret Reddish, MD

## 2021-03-26 ENCOUNTER — Encounter: Payer: Self-pay | Admitting: Family Medicine

## 2021-03-26 ENCOUNTER — Ambulatory Visit (INDEPENDENT_AMBULATORY_CARE_PROVIDER_SITE_OTHER): Payer: 59 | Admitting: Family Medicine

## 2021-03-26 ENCOUNTER — Other Ambulatory Visit: Payer: Self-pay

## 2021-03-26 VITALS — BP 144/84 | HR 66 | Temp 98.2°F | Ht 64.0 in | Wt 161.2 lb

## 2021-03-26 DIAGNOSIS — E119 Type 2 diabetes mellitus without complications: Secondary | ICD-10-CM

## 2021-03-26 DIAGNOSIS — Z125 Encounter for screening for malignant neoplasm of prostate: Secondary | ICD-10-CM | POA: Diagnosis not present

## 2021-03-26 DIAGNOSIS — Z0000 Encounter for general adult medical examination without abnormal findings: Secondary | ICD-10-CM | POA: Diagnosis not present

## 2021-03-26 DIAGNOSIS — G8929 Other chronic pain: Secondary | ICD-10-CM

## 2021-03-26 DIAGNOSIS — F418 Other specified anxiety disorders: Secondary | ICD-10-CM

## 2021-03-26 DIAGNOSIS — I152 Hypertension secondary to endocrine disorders: Secondary | ICD-10-CM

## 2021-03-26 DIAGNOSIS — M791 Myalgia, unspecified site: Secondary | ICD-10-CM

## 2021-03-26 DIAGNOSIS — M542 Cervicalgia: Secondary | ICD-10-CM

## 2021-03-26 DIAGNOSIS — E785 Hyperlipidemia, unspecified: Secondary | ICD-10-CM | POA: Diagnosis not present

## 2021-03-26 DIAGNOSIS — E1159 Type 2 diabetes mellitus with other circulatory complications: Secondary | ICD-10-CM

## 2021-03-26 DIAGNOSIS — E1169 Type 2 diabetes mellitus with other specified complication: Secondary | ICD-10-CM

## 2021-03-26 DIAGNOSIS — M545 Low back pain, unspecified: Secondary | ICD-10-CM

## 2021-03-26 DIAGNOSIS — Z86010 Personal history of colonic polyps: Secondary | ICD-10-CM

## 2021-03-26 DIAGNOSIS — I700 Atherosclerosis of aorta: Secondary | ICD-10-CM

## 2021-03-26 DIAGNOSIS — T466X5A Adverse effect of antihyperlipidemic and antiarteriosclerotic drugs, initial encounter: Secondary | ICD-10-CM

## 2021-03-26 LAB — COMPREHENSIVE METABOLIC PANEL
ALT: 20 U/L (ref 0–53)
AST: 21 U/L (ref 0–37)
Albumin: 4.8 g/dL (ref 3.5–5.2)
Alkaline Phosphatase: 94 U/L (ref 39–117)
BUN: 14 mg/dL (ref 6–23)
CO2: 29 mEq/L (ref 19–32)
Calcium: 10.3 mg/dL (ref 8.4–10.5)
Chloride: 99 mEq/L (ref 96–112)
Creatinine, Ser: 0.91 mg/dL (ref 0.40–1.50)
GFR: 90.64 mL/min (ref >60.00)
Glucose, Bld: 82 mg/dL (ref 70–99)
Potassium: 5.2 mEq/L — ABNORMAL HIGH (ref 3.5–5.1)
Sodium: 138 mEq/L (ref 135–145)
Total Bilirubin: 0.8 mg/dL (ref 0.2–1.2)
Total Protein: 7.8 g/dL (ref 6.0–8.3)

## 2021-03-26 LAB — CBC WITH DIFFERENTIAL/PLATELET
Basophils Absolute: 0 10*3/uL (ref 0.0–0.1)
Basophils Relative: 0.5 % (ref 0.0–3.0)
Eosinophils Absolute: 0.2 10*3/uL (ref 0.0–0.7)
Eosinophils Relative: 2.9 % (ref 0.0–5.0)
HCT: 44.4 % (ref 39.0–52.0)
Hemoglobin: 15.2 g/dL (ref 13.0–17.0)
Lymphocytes Relative: 22 % (ref 12.0–46.0)
Lymphs Abs: 1.9 10*3/uL (ref 0.7–4.0)
MCHC: 34.3 g/dL (ref 30.0–36.0)
MCV: 86.1 fl (ref 78.0–100.0)
Monocytes Absolute: 0.6 10*3/uL (ref 0.1–1.0)
Monocytes Relative: 7.4 % (ref 3.0–12.0)
Neutro Abs: 5.8 10*3/uL (ref 1.4–7.7)
Neutrophils Relative %: 67.2 % (ref 43.0–77.0)
Platelets: 292 10*3/uL (ref 150.0–400.0)
RBC: 5.16 Mil/uL (ref 4.22–5.81)
RDW: 12.8 % (ref 11.5–15.5)
WBC: 8.6 10*3/uL (ref 4.0–10.5)

## 2021-03-26 LAB — LIPID PANEL
Cholesterol: 210 mg/dL — ABNORMAL HIGH (ref 0–200)
HDL: 36.2 mg/dL — ABNORMAL LOW (ref >39.00)
LDL Cholesterol: 135 mg/dL — ABNORMAL HIGH (ref 0–99)
NonHDL: 173.34
Total CHOL/HDL Ratio: 6
Triglycerides: 192 mg/dL — ABNORMAL HIGH (ref 0.0–149.0)
VLDL: 38.4 mg/dL (ref 0.0–40.0)

## 2021-03-26 LAB — PSA: PSA: 1.4 ng/mL (ref 0.10–4.00)

## 2021-03-26 LAB — HEMOGLOBIN A1C: Hgb A1c MFr Bld: 6 % (ref 4.6–6.5)

## 2021-03-26 MED ORDER — FLUTICASONE PROPIONATE 50 MCG/ACT NA SUSP
2.0000 | Freq: Every day | NASAL | 5 refills | Status: DC
Start: 2021-03-26 — End: 2021-09-14

## 2021-03-29 ENCOUNTER — Ambulatory Visit: Payer: Self-pay

## 2021-03-29 ENCOUNTER — Ambulatory Visit (INDEPENDENT_AMBULATORY_CARE_PROVIDER_SITE_OTHER): Payer: 59 | Admitting: Family Medicine

## 2021-03-29 ENCOUNTER — Other Ambulatory Visit: Payer: Self-pay

## 2021-03-29 DIAGNOSIS — M545 Low back pain, unspecified: Secondary | ICD-10-CM | POA: Diagnosis not present

## 2021-03-29 DIAGNOSIS — M542 Cervicalgia: Secondary | ICD-10-CM | POA: Diagnosis not present

## 2021-03-29 DIAGNOSIS — G8929 Other chronic pain: Secondary | ICD-10-CM

## 2021-03-29 MED ORDER — LOVASTATIN 10 MG PO TABS: 10.0000 mg | ORAL_TABLET | ORAL | 3 refills | Status: DC

## 2021-03-29 NOTE — Progress Notes (Signed)
Office Visit Note   Patient: Alex [name]           Date of Birth: June 28, 1961           MRN: [id number] Visit Date: 03/29/2021 Requested by: Marin Olp, MD [place],  [place] [address] PCP: Marin Olp, MD  Subjective: Chief Complaint  Patient presents with  . Lower Back - Pain    Chronic low-grade pain in the lower back, with varying severity. Today is not as bad. With working all day, he will have pain in the right lower back radiating to the buttock region. If he then sits down, the pain may travel down the posterior leg to the foot. He was diagnosed with scoliosis as a child. Has seen chiropractors, without much relief. Has tried voltaren gel topically on the lower back - relieves the pain some.  . Neck - Pain    "Pulling sensation" in the left side of the neck. Occasional pain in the lower arm, with lifting - shooting pain up to the elbow. Chronic pains in the neck. Neck "pops/cracks" when he turns head to each side. Aleve helps some. Right-hand dominant.    HPI: He is here with neck and low back pain.  Longstanding intermittent problems with his spine.  He has managed this periodically with chiropractic in [place] per Dr. Bethann Goo.  Lately it seems that his flareups are more frequent, several times per year rather than once.  They are also lasting longer.  Not a lot of radicular symptoms.  He typically does not take anything for his pain.  He had lumbar x-rays in 2017 and a CT scan in the last couple years showing some degenerative changes.  He has not had x-rays of his cervical spine.  Denies weakness or numbness.              ROS:   All other systems were reviewed and are negative.  Objective: Vital Signs: There were no vitals taken for this visit.  Physical Exam:  General:  Alert and oriented, in no acute distress. Pulm:  Breathing unlabored. Psy:  Normal mood, congruent affect.  Neck: He has slightly decreased rotation bilaterally.  He has a tender  trigger point to the left of C3-4.  He has another 1 in the trapezius belly on the left.  Spurling's test negative, upper extremity strength and reflexes are normal. Low back: Good range of motion, slightly tender near the right SI joint.  No pain with internal hip rotation.  Lower extremity strength and reflexes are normal.  Straight leg raise is negative.   Imaging: XR Cervical Spine 2 or 3 views  Result Date: 03/29/2021 X-rays cervical spine reveal diffuse moderate to severe degenerative disc disease with straightening of the cervical spine.   Assessment & Plan: 1.  Chronic neck and low back pain with underlying degenerative changes -He will try another course of chiropractic in the evening.  If he fails to improve, then MRI of the cervical and lumbar spine.  He did not want any medication.     Procedures: No procedures performed        PMFS History: Patient Active Problem List   Diagnosis Date Noted  . Atherosclerosis of aorta ([place]) 10/15/2020  . Piriformis syndrome of right side 08/19/2019  . Bee sting allergy 07/04/2019  . Myalgia due to statin 08/01/2018  . Hx of adenomatous polyp of colon 04/28/2017  . Diabetes mellitus type II, controlled ([place]) 11/11/2010  .  Situational anxiety 07/22/2010  . Hyperlipidemia associated with type 2 diabetes mellitus ([place]) 12/17/2007  . Hypertension associated with diabetes ([place]) 12/17/2007   Past Medical History:  Diagnosis Date  . Arthritis   . Diabetes mellitus    diet controlled  . Hx of adenomatous polyp of colon 04/28/2017  . Mixed hyperlipidemia   . Nonspecific elevation of levels of transaminase or lactic acid dehydrogenase (LDH)   . Unspecified essential hypertension     Family History  Problem Relation Age of Onset  . COPD Mother   . Emphysema Mother   . Heart disease Father        heavy smoker, 70  . Coronary artery disease Father   . Heart attack Father   . Colon cancer Neg Hx     Past Surgical History:   Procedure Laterality Date  . APPENDECTOMY  1965   Social History   Occupational History  . Not on file  Tobacco Use  . Smoking status: Never Smoker  . Smokeless tobacco: Never Used  Substance and Sexual Activity  . Alcohol use: Yes    Alcohol/week: 1.0 standard drink    Types: 1 Standard drinks or equivalent per week  . Drug use: No  . Sexual activity: Not on file

## 2021-07-02 ENCOUNTER — Ambulatory Visit: Payer: 59 | Admitting: Family Medicine

## 2021-07-07 ENCOUNTER — Other Ambulatory Visit: Payer: Self-pay | Admitting: Family Medicine

## 2021-07-23 ENCOUNTER — Telehealth: Payer: Self-pay

## 2021-07-23 NOTE — Telephone Encounter (Signed)
Patient wanted to make dr.hunter aware he tested positive for covid last night and was told to let dr hunter know if he tested positive so we can send something in for him.[id number] - [place], [address]

## 2021-07-24 ENCOUNTER — Encounter: Payer: Self-pay | Admitting: Emergency Medicine

## 2021-07-24 ENCOUNTER — Ambulatory Visit
Admission: EM | Admit: 2021-07-24 | Discharge: 2021-07-24 | Disposition: A | Discharge status: Home / Self Care | Payer: 59 | Attending: Emergency Medicine | Admitting: Emergency Medicine

## 2021-07-24 ENCOUNTER — Other Ambulatory Visit: Payer: Self-pay

## 2021-07-24 DIAGNOSIS — U071 COVID-19: Secondary | ICD-10-CM | POA: Diagnosis not present

## 2021-07-24 MED ORDER — BENZONATATE 100 MG PO CAPS: 100.0000 mg | ORAL_CAPSULE | Freq: Three times a day (TID) | ORAL | 0 refills | Status: DC

## 2021-07-24 NOTE — ED Triage Notes (Addendum)
Positive covid test on Thursday

## 2021-07-24 NOTE — Discharge Instructions (Signed)

## 2021-07-24 NOTE — ED Provider Notes (Signed)
Alex [name]   [id number] 07/24/21 Arrival Time: 0809   CC: COVID symptoms  SUBJECTIVE: History from: patient.  Alex [name] is a 60 y.o. male who presents with congestion, drainage, mild cough, and diarrhea x 4 days.  Denies sick exposure to COVID, flu or strep.  Has tried OTC medications without relief.  Denies aggravating factors.  Reports previous symptoms in the past.   Denies fever, chills, SOB, wheezing, chest pain, nausea, changes in bowel or bladder habits.    + covid test at home  ROS: As per HPI.  All other pertinent ROS negative.     Past Medical History:  Diagnosis Date   Arthritis    Diabetes mellitus    diet controlled   Hx of adenomatous polyp of colon 04/28/2017   Mixed hyperlipidemia    Nonspecific elevation of levels of transaminase or lactic acid dehydrogenase (LDH)    Unspecified essential hypertension    Past Surgical History:  Procedure Laterality Date   APPENDECTOMY  1965   Allergies  Allergen Reactions   Amlodipine     gingival hyperplasia    No current facility-administered medications on file prior to encounter.   Current Outpatient Medications on File Prior to Encounter  Medication Sig Dispense Refill   lovastatin (MEVACOR) 10 MG tablet Take 1 tablet (10 mg total) by mouth once a week. 12 tablet 3   Coenzyme Q10 150 MG CAPS Take 150 capsules by mouth daily.     EPINEPHrine 0.3 mg/0.3 mL IJ SOAJ injection Inject 0.3 mLs (0.3 mg total) into the muscle as needed for anaphylaxis. 2 each 1   fluticasone (FLONASE) 50 MCG/ACT nasal spray Place 2 sprays into both nostrils daily. 16 g 5   glucose blood (ACCU-CHEK AVIVA) test strip Check blood sugar daily as needed. 100 each 3   glucose blood test strip 1 each by Other route daily. Use as instructed 100 each 12   Lancet Devices (ACCU-CHEK SOFTCLIX) lancets 1 each by Other route daily. Use as instructed 1 each 11   lisinopril (ZESTRIL) 40 MG tablet TAKE 1 TABLET(40 MG) BY MOUTH DAILY 90 tablet  1   Multiple Vitamin (MULTIVITAMIN) tablet Take 1 tablet by mouth daily.     Omega-3 Fatty Acids (RA FISH OIL) 1000 MG CAPS Take by mouth.     tamsulosin (FLOMAX) 0.4 MG CAPS capsule Take 1 capsule (0.4 mg total) by mouth daily after breakfast. 30 capsule 0   vitamin B-12 (CYANOCOBALAMIN) 500 MCG tablet Take 500 mcg by mouth daily.     Social History   Socioeconomic History   Marital status: Married    Spouse name: Not on file   Number of children: Not on file   Years of education: Not on file   Highest education level: Not on file  Occupational History   Not on file  Tobacco Use   Smoking status: Never   Smokeless tobacco: Never  Substance and Sexual Activity   Alcohol use: Yes    Alcohol/week: 1.0 standard drink    Types: 1 Standard drinks or equivalent per week   Drug use: No   Sexual activity: Not on file  Other Topics Concern   Not on file  Social History Narrative   Married 2007. No kids. 3 yorkies and maltese-yorkie mix   Alex [name] work. 5 days a week now.    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), guns, target shooting, travel   Social Determinants of Health   Financial Resource Strain: Not on file  Food Insecurity: Not on file  Transportation Needs: Not on file  Physical Activity: Not on file  Stress: Not on file  Social Connections: Not on file  Intimate Partner Violence: Not on file   Family History  Problem Relation Age of Onset   COPD Mother    Emphysema Mother    Heart disease Father        heavy smoker, 20   Coronary artery disease Father    Heart attack Father    Colon cancer Neg Hx     OBJECTIVE:  Vitals:   07/24/21 0825  BP: 131/85  Pulse: 68  Resp: 17  Temp: 97.9 F (36.6 C)  TempSrc: Tympanic  SpO2: 94%    General appearance: alert; appears fatigued, but nontoxic; speaking in full sentences and tolerating  own secretions HEENT: NCAT; Ears: EACs clear, TMs pearly gray; Eyes: PERRL.  EOM grossly intact. Nose: nares patent without rhinorrhea, Throat: oropharynx clear, tonsils non erythematous or enlarged, uvula midline  Neck: supple without LAD Lungs: unlabored respirations, symmetrical air entry; cough: mild; no respiratory distress; CTAB Heart: regular rate and rhythm.   Skin: warm and dry Psychological: alert and cooperative; normal mood and affect  ASSESSMENT & PLAN:  1. COVID-19 virus infection     Meds ordered this encounter  Medications   benzonatate (TESSALON) 100 MG capsule    Sig: Take 1 capsule (100 mg total) by mouth every 8 (eight) hours.    Dispense:  21 capsule    Refill:  0    Order Specific Question:   Supervising Provider    Answer:   Raylene Everts [id number]    COVID testing ordered.  It will take between 5-7 days for test results.  Someone will contact you regarding abnormal results.    In the meantime: You should remain isolated in your home for 5 days from symptom onset AND greater than 72 hours after symptoms resolution (absence of fever without the use of fever-reducing medication and improvement in respiratory symptoms), whichever is longer Get plenty of rest and push fluids Tessalon Perles prescribed for cough Use OTC zyrtec for nasal congestion, runny nose, and/or sore throat Use OTC flonase for nasal congestion and runny nose Use medications daily for symptom relief Use OTC medications like ibuprofen or tylenol as needed fever or pain Call or go to the ED if you have any new or worsening symptoms such as fever, worsening cough, shortness of breath, chest tightness, chest pain, turning blue, changes in mental status, etc...   Reviewed expectations re: course of current medical issues. Questions answered. Outlined signs and symptoms indicating need for more acute intervention. Patient verbalized understanding. After Visit Summary given.           Lestine Box, PA-C 07/24/21 0830

## 2021-07-26 NOTE — Telephone Encounter (Signed)
Please schedule virtual for pt to discuss treatment options with Dr. Yong Channel.

## 2021-08-23 NOTE — Progress Notes (Incomplete)
Phone [phone] In person visit   Subjective:   Alex Alex is a 60 y.o. year old very pleasant male patient who presents for/with See problem oriented charting No chief complaint on file.   This visit occurred during the SARS-CoV-2 public health emergency.  Safety protocols were in place, including screening questions prior to the visit, additional usage of staff PPE, and extensive cleaning of exam room while observing appropriate contact time as indicated for disinfecting solutions.   Past Medical History-  Patient Active Problem List   Diagnosis Date Noted   Atherosclerosis of aorta ([place]) 10/15/2020   Piriformis syndrome of right side 08/19/2019   Bee sting allergy 07/04/2019   Myalgia due to statin 08/01/2018   Hx of adenomatous polyp of colon 04/28/2017   Diabetes mellitus type II, controlled ([place]) 11/11/2010   Situational anxiety 07/22/2010   Hyperlipidemia associated with type 2 diabetes mellitus ([place]) 12/17/2007   Hypertension associated with diabetes ([place]) 12/17/2007    Medications- reviewed and updated Current Outpatient Medications  Medication Sig Dispense Refill   lovastatin (MEVACOR) 10 MG tablet Take 1 tablet (10 mg total) by mouth once a week. 12 tablet 3   benzonatate (TESSALON) 100 MG capsule Take 1 capsule (100 mg total) by mouth every 8 (eight) hours. 21 capsule 0   Coenzyme Q10 150 MG CAPS Take 150 capsules by mouth daily.     EPINEPHrine 0.3 mg/0.3 mL IJ SOAJ injection Inject 0.3 mLs (0.3 mg total) into the muscle as needed for anaphylaxis. 2 each 1   fluticasone (FLONASE) 50 MCG/ACT nasal spray Place 2 sprays into both nostrils daily. 16 g 5   glucose blood (ACCU-CHEK AVIVA) test strip Check blood sugar daily as needed. 100 each 3   glucose blood test strip 1 each by Other route daily. Use as instructed 100 each 12   Lancet Devices (ACCU-CHEK SOFTCLIX) lancets 1 each by Other route daily. Use as instructed 1 each 11   lisinopril (ZESTRIL) 40 MG  tablet TAKE 1 TABLET(40 MG) BY MOUTH DAILY 90 tablet 1   Multiple Vitamin (MULTIVITAMIN) tablet Take 1 tablet by mouth daily.     Omega-3 Fatty Acids (RA FISH OIL) 1000 MG CAPS Take by mouth.     tamsulosin (FLOMAX) 0.4 MG CAPS capsule Take 1 capsule (0.4 mg total) by mouth daily after breakfast. 30 capsule 0   vitamin B-12 (CYANOCOBALAMIN) 500 MCG tablet Take 500 mcg by mouth daily.     No current facility-administered medications for this visit.     Objective:  There were no vitals taken for this visit. Gen: NAD, resting comfortably CV: RRR no murmurs rubs or gallops Lungs: CTAB no crackles, wheeze, rhonchi Abdomen: soft/nontender/nondistended/normal bowel sounds. No rebound or guarding.  Ext: no edema Skin: warm, dry Neuro: grossly normal, moves all extremities  ***    Assessment and Plan  #: ED visit- COVID + S: Pt presented to ED on 07/24/2021. He started experiencing congestion, drainage, mild cough and diarrhea for 4 days. He denied sick exposure to COVID, flu or strep throat and aggravating factors.. He tried OTC medications without relief. Tested + on COVID test at home.  -tessalon 100 mg every 8 hours. A/P: ***  # social update- going down to 3 days a week starting later this year   #Diabetes mellitus-with hypertension and hyperlipidemia S: Patient had been diet and exercise controlled CBGs- *** Exercise and diet- *** Lab Results  Component Value Date   HGBA1C 6.0 03/26/2021  HGBA1C 5.6 09/25/2020   HGBA1C 6.1 01/06/2020    A/P: ***   #Hyperlipidemia associated with diabetes/statin myalgia #aortic atherosclerosis on x-ray 02/04/16 lumbar spine S:  Lovastatin 10 mg once a week-we took him off due to rash on hands following with dermatology.  He does take fish oil -Patient been intolerant to statin due to myalgias in the past even atorvastatin 40 mg once a week. 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 Some cracks and pops with this   Also had pain when bending over in the lower back, also lateral hip pain as well and sometimes into the leg . X-ray 2017 showed DDD of L5-s1 .  A/P: ***  #Allergies-managed with Flonase for most part.   Recommended follow up: No follow-ups on file. Future Appointments  Date Time Provider [place]  08/27/2021  1:00 PM Alex Olp, MD [facility]    Lab/Order associations: No diagnosis found.  No orders of the defined types were placed in this encounter.   I,Alex Alex,acting as a scribe for Alex Reddish, MD.,have documented all relevant documentation on the behalf of Alex Reddish, MD,as directed by  Alex Reddish, MD while in the presence of Alex Reddish, MD.   *** Return precautions advised.  Alex Alex

## 2021-08-27 ENCOUNTER — Ambulatory Visit: Payer: 59 | Admitting: Family Medicine

## 2021-08-27 DIAGNOSIS — E119 Type 2 diabetes mellitus without complications: Secondary | ICD-10-CM

## 2021-08-27 DIAGNOSIS — M542 Cervicalgia: Secondary | ICD-10-CM

## 2021-08-27 DIAGNOSIS — I700 Atherosclerosis of aorta: Secondary | ICD-10-CM

## 2021-08-27 DIAGNOSIS — I152 Hypertension secondary to endocrine disorders: Secondary | ICD-10-CM

## 2021-08-27 DIAGNOSIS — G8929 Other chronic pain: Secondary | ICD-10-CM

## 2021-08-27 DIAGNOSIS — E1169 Type 2 diabetes mellitus with other specified complication: Secondary | ICD-10-CM

## 2021-09-14 ENCOUNTER — Other Ambulatory Visit: Payer: Self-pay

## 2021-09-14 ENCOUNTER — Ambulatory Visit: Payer: 59 | Admitting: Family Medicine

## 2021-09-14 ENCOUNTER — Encounter: Payer: Self-pay | Admitting: Family Medicine

## 2021-09-14 VITALS — BP 135/87 | HR 65 | Temp 98.5°F | Ht 64.0 in | Wt 165.8 lb

## 2021-09-14 DIAGNOSIS — E1159 Type 2 diabetes mellitus with other circulatory complications: Secondary | ICD-10-CM | POA: Diagnosis not present

## 2021-09-14 DIAGNOSIS — N401 Enlarged prostate with lower urinary tract symptoms: Secondary | ICD-10-CM

## 2021-09-14 DIAGNOSIS — I152 Hypertension secondary to endocrine disorders: Secondary | ICD-10-CM

## 2021-09-14 DIAGNOSIS — I700 Atherosclerosis of aorta: Secondary | ICD-10-CM

## 2021-09-14 DIAGNOSIS — E1169 Type 2 diabetes mellitus with other specified complication: Secondary | ICD-10-CM | POA: Diagnosis not present

## 2021-09-14 DIAGNOSIS — G720 Drug-induced myopathy: Secondary | ICD-10-CM

## 2021-09-14 DIAGNOSIS — Z23 Encounter for immunization: Secondary | ICD-10-CM | POA: Diagnosis not present

## 2021-09-14 DIAGNOSIS — E119 Type 2 diabetes mellitus without complications: Secondary | ICD-10-CM

## 2021-09-14 DIAGNOSIS — E785 Hyperlipidemia, unspecified: Secondary | ICD-10-CM | POA: Diagnosis not present

## 2021-09-14 DIAGNOSIS — R351 Nocturia: Secondary | ICD-10-CM

## 2021-09-14 LAB — COMPREHENSIVE METABOLIC PANEL
ALT: 26 U/L (ref 0–53)
AST: 24 U/L (ref 0–37)
Albumin: 4.8 g/dL (ref 3.5–5.2)
Alkaline Phosphatase: 82 U/L (ref 39–117)
BUN: 14 mg/dL (ref 6–23)
CO2: 27 mEq/L (ref 19–32)
Calcium: 9.9 mg/dL (ref 8.4–10.5)
Chloride: 101 mEq/L (ref 96–112)
Creatinine, Ser: 0.81 mg/dL (ref 0.40–1.50)
GFR: 94.5 mL/min (ref >60.00)
Glucose, Bld: 94 mg/dL (ref 70–99)
Potassium: 5 mEq/L (ref 3.5–5.1)
Sodium: 137 mEq/L (ref 135–145)
Total Bilirubin: 0.7 mg/dL (ref 0.2–1.2)
Total Protein: 7.7 g/dL (ref 6.0–8.3)

## 2021-09-14 LAB — HEMOGLOBIN A1C: Hgb A1c MFr Bld: 6 % (ref 4.6–6.5)

## 2021-09-14 LAB — LDL CHOLESTEROL, DIRECT: Direct LDL: 143 mg/dL

## 2021-09-14 MED ORDER — FLUTICASONE PROPIONATE 50 MCG/ACT NA SUSP
2.0000 | Freq: Every day | NASAL | 3 refills | Status: DC
Start: 2021-09-14 — End: 2022-10-03

## 2021-09-14 MED ORDER — LISINOPRIL 40 MG PO TABS
ORAL_TABLET | ORAL | 3 refills | Status: DC
Start: 2021-09-14 — End: 2022-04-26

## 2021-09-14 MED ORDER — CLOBETASOL PROPIONATE 0.05 % EX CREA: 1.0000 "application " | TOPICAL_CREAM | Freq: Two times a day (BID) | CUTANEOUS | 2 refills | Status: DC

## 2021-09-14 NOTE — Progress Notes (Signed)
Phone [phone] In person visit   Subjective:   Alex [name] is a 60 y.o. year old very pleasant male patient who presents for/with See problem oriented charting Chief Complaint  Patient presents with   Diabetes   Hyperlipidemia   Hypertension    This visit occurred during the SARS-CoV-2 public health emergency.  Safety protocols were in place, including screening questions prior to the visit, additional usage of staff PPE, and extensive cleaning of exam room while observing appropriate contact time as indicated for disinfecting solutions.   Past Medical History-  Patient Active Problem List   Diagnosis Date Noted   Piriformis syndrome of right side 08/19/2019    Priority: 3.   Bee sting allergy 07/04/2019    Priority: 3.   Situational anxiety 07/22/2010    Priority: 3.   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) 12/17/2007    Priority: Medium     Medications- reviewed and updated Current Outpatient Medications  Medication Sig Dispense Refill   benzonatate (TESSALON) 100 MG capsule Take 1 capsule (100 mg total) by mouth every 8 (eight) hours. 21 capsule 0   clobetasol cream (TEMOVATE) 6.43 % Apply 1 application topically 2 (two) times daily. Eczema for up to 7-10 days 60 g 2   Coenzyme Q10 150 MG CAPS Take 150 capsules by mouth daily.     EPINEPHrine 0.3 mg/0.3 mL IJ SOAJ injection Inject 0.3 mLs (0.3 mg total) into the muscle as needed for anaphylaxis. 2 each 1   glucose blood (ACCU-CHEK AVIVA) test strip Check blood sugar daily as needed. 100  each 3   glucose blood test strip 1 each by Other route daily. Use as instructed 100 each 12   Lancet Devices (ACCU-CHEK SOFTCLIX) lancets 1 each by Other route daily. Use as instructed 1 each 11   lovastatin (MEVACOR) 10 MG tablet Take 1 tablet (10 mg total) by mouth once a week. 12 tablet 3   Multiple Vitamin (MULTIVITAMIN) tablet Take 1 tablet by mouth daily.     Omega-3 Fatty Acids (RA FISH OIL) 1000 MG CAPS Take by mouth.     tamsulosin (FLOMAX) 0.4 MG CAPS capsule Take 1 capsule (0.4 mg total) by mouth daily after breakfast. 30 capsule 0   vitamin B-12 (CYANOCOBALAMIN) 500 MCG tablet Take 500 mcg by mouth daily.     fluticasone (FLONASE) 50 MCG/ACT nasal spray Place 2 sprays into both nostrils daily. 48 g 3   lisinopril (ZESTRIL) 40 MG tablet TAKE 1 TABLET(40 MG) BY MOUTH DAILY 90 tablet 3   No current facility-administered medications for this visit.     Objective:  BP 135/87   Pulse 65   Temp 98.5 F (36.9 C) (Temporal)   Ht 5\' 4"  (1.626 m)   Wt 165 lb 12.8 oz (75.2 kg)   SpO2 97%   BMI 28.46 kg/m  Gen: NAD, resting comfortably CV: RRR no murmurs rubs or gallops Lungs: CTAB no crackles, wheeze, rhonchi Ext: no edema Skin: warm, dry     Assessment  and Plan   #social update- was considering retirement- may push this back due to retirement. Still doing 30 hrs a week  #Urgent Care F/U for COVID-19 virus infection S:Patient was more recently seen in Urgent Care on 07/24/2021 and presented with congestion, drainage, mild cough, and diarrhea x4 days. COVID-19 testing at home was positive.  Tessalon 100 mg was also ordered during the encounter - he was to take this PO every 8 hours.  Today patient reports did reasonably well once fever broke after 4 days A/P: no lingering symptoms- glad he did well.   #Diabetes mellitus-with hypertension and hyperlipidemia S: Patient has been diet and exercise controlled Lab Results  Component Value Date   HGBA1C 6.0 03/26/2021   HGBA1C  5.6 09/25/2020   HGBA1C 6.1 01/06/2020  A/P:   hopefully stable- update A1c today. Continue without meds for now   #Hyperlipidemia associated with diabetes- drug induced myopathy #aortic atherosclerosis S: medication: Patient has been intolerant to statin due to myalgias in the past even atorvastatin 40 mg once a week. Patient willing to trial lovastatin 10 mg once a week.  He also takes fish oil Lab Results  Component Value Date   CHOL 210 (H) 03/26/2021   HDL 36.20 (L) 03/26/2021   LDLCALC 135 (H) 03/26/2021   LDLDIRECT 148.0 01/01/2019   TRIG 192.0 (H) 03/26/2021   CHOLHDL 6 03/26/2021  A/P: Hopefully improved with once weekly lovastatin-update LDL today-glad he is tolerating this -plans to go back to dietician to work on weight loss- wants to get back to 155- plans to go back to [place] dietician -aortic atherosclerosis LDL goal <70- likely limiting by myalgias  #Hypertension associated with diabetes S: medication: Compliant with lisinopril 40 mg Home reading #s:home readings 120s- low 130s/mainly mid 80s BP Readings from Last 3 Encounters:  09/14/21 135/87 most recent home reading  07/24/21 131/85  03/26/21 (!) 144/84  A/P: reasonably  Controlled. Continue current medications.  Push for weight loss to get even lower on home scales- prefer <135/85 -discussed risk of angioedema  # Neck pain/lower back pain S:Last visit patient reported that moving neck quickly from left to right or right to left causes pain. Some cracks and pops noted with this.  Also had pain when bending over in the lower back, also lateral hip pain as well and sometimes into the leg . X-ray 2017 showed DDD of L5-s1 .  - seen Dr. Charlann Boxer and chiropractors in past- patient- wanted a non chiropractic approach so a referral to Dr. Junius Roads from Sports Medicine was placed- he has seen him  Mobility better after working with PT through Dr. Junius Roads but still having  pain A/P: patient plans to schedule follow up with Dr.  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 Controlled type 2 diabetes mellitus without complication, without long-term current use of insulin (HCC)  E11.9 Comprehensive metabolic panel    Hemoglobin A1c    LDL cholesterol, direct    2. Hyperlipidemia  associated with type 2 diabetes mellitus (HCC)  E11.69 Comprehensive metabolic panel   Z30.8 LDL cholesterol, direct    3. Hypertension associated with diabetes ([place])  E11.59 Comprehensive metabolic panel   M57.8 LDL cholesterol, direct    4. Need for immunization against influenza  Z23 Flu Vaccine QUAD 74mo+IM (Fluarix, Fluzone & Alfiuria Quad PF)    5. 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 BPH associated with nocturia  N40.1    R35.1       Meds ordered this encounter  Medications   fluticasone (FLONASE) 50 MCG/ACT nasal spray    Sig: Place 2 sprays into both nostrils daily.    Dispense:  48 g    Refill:  3   clobetasol cream (TEMOVATE) 0.05 %    Sig: Apply 1 application topically 2 (two) times daily. Eczema for up to 7-10 days    Dispense:  60 g    Refill:  2   lisinopril (ZESTRIL) 40 MG tablet    Sig: TAKE 1 TABLET(40 MG) BY MOUTH DAILY    Dispense:  90 tablet    Refill:  3    I,Harris Phan,acting as a scribe for Garret Reddish, MD.,have documented all relevant documentation on the behalf of Garret Reddish, MD,as directed by  Garret Reddish, MD while in the presence of Garret Reddish, MD.   I, Garret Reddish, MD, have reviewed all  documentation for this visit. The documentation on 09/14/21 for the exam, diagnosis, procedures, and orders are all accurate and complete.   Return precautions advised.  Garret Reddish, MD

## 2021-09-14 NOTE — Patient Instructions (Addendum)
Health Maintenance Due  Topic Date Due   COVID-19 Vaccine (3 - Booster for Moderna series) - Recommend getting Omicron/Bivalent booster only at your local pharmacy 3 months out from having COVID-19 back in August! Please let us know when you have received this vaccination.  08/07/2020   OPHTHALMOLOGY EXAM  - If you have had your eye exam within the last year, please sign release of information at check out desk. If you have not had an eye exam within a year, please get one at this time as this is important for your diabetes care.   05/11/2021   INFLUENZA VACCINE  - Flu shot 06/28/2021   Please stop by lab before you go If you have mychart- we will send your results within 3 business days of [place] receiving them.  If you do not have mychart- we will call you about results within 5 business days of [place] receiving them.  *please also note that you will see labs on mychart as soon as they post. I will later go in and write notes on them- will say "notes from Dr. Yong Channel"  I am glad to hear that you will put efforts into eating a healthier diet and exercising regularly to help with weight loss. Goal at home iis to get at least 150 minutes of exercise per week. I do think this will also help with you blood pressure as well.  Refilled Flonase and cream fr your wrist.  Keep caution that with Lisinopril it may cause angioedema (swelling) of the lip, tongue, or throat. If this occurs, please take a benadryl, let me know, and stop the medication immediately.   Keep an eye on blood pressure- if over 135/85 at home regularly please see [place]  Recommended follow up: Please schedule you next physical for March 27, 2022 or later.

## 2021-09-21 ENCOUNTER — Other Ambulatory Visit: Payer: Self-pay

## 2021-09-21 ENCOUNTER — Encounter: Payer: Self-pay | Admitting: Emergency Medicine

## 2021-09-21 ENCOUNTER — Ambulatory Visit
Admission: EM | Admit: 2021-09-21 | Discharge: 2021-09-21 | Disposition: A | Discharge status: Home / Self Care | Payer: 59 | Attending: Emergency Medicine | Admitting: Emergency Medicine

## 2021-09-21 DIAGNOSIS — J069 Acute upper respiratory infection, unspecified: Secondary | ICD-10-CM | POA: Diagnosis not present

## 2021-09-21 DIAGNOSIS — J209 Acute bronchitis, unspecified: Secondary | ICD-10-CM | POA: Diagnosis not present

## 2021-09-21 MED ORDER — HYDROCOD POLST-CPM POLST ER 10-8 MG/5ML PO SUER: 5.0000 mL | Freq: Two times a day (BID) | ORAL | 0 refills | Status: DC | PRN

## 2021-09-21 MED ORDER — AEROCHAMBER PLUS MISC: 2 refills | Status: DC

## 2021-09-21 MED ORDER — PREDNISONE 20 MG PO TABS: 40.0000 mg | ORAL_TABLET | Freq: Every day | ORAL | 0 refills | Status: AC

## 2021-09-21 MED ORDER — ALBUTEROL SULFATE HFA 108 (90 BASE) MCG/ACT IN AERS
1.0000 | INHALATION_SPRAY | RESPIRATORY_TRACT | 0 refills | Status: DC | PRN
Start: 2021-09-21 — End: 2023-11-02

## 2021-09-21 NOTE — ED Provider Notes (Signed)
HPI  SUBJECTIVE:  Alex [name] is a 60 y.o. male who presents with 6 days of chest congestion, sinus pain and pressure, postnasal drip, diarrhea, nasal congestion, rhinorrhea.  States that he is getting worse.  States that he is unable to sleep at night secondary to the cough.  No upper dental pain, facial swelling.  No fevers, bodies, headaches, sore throat, loss of sense of smell or taste, wheezing, shortness of breath, nausea, vomiting, abdominal pain.  No known COVID or flu exposure.  He got the second dose of the COVID-vaccine and this years flu vaccine.  No antipyretic in the past 6 hours.  He has been taking Tylenol, Mucinex multisymptom and Flonase without improvement in his symptoms.  Symptoms are worse with lying down.  Past medical history of COVID in August 2022, hypertension, diabetes.  TOI:ZTIWPY, Alex Mars, MD   Past Medical History:  Diagnosis Date   Arthritis    Diabetes mellitus    diet controlled   Hx of adenomatous polyp of colon 04/28/2017   Mixed hyperlipidemia    Nonspecific elevation of levels of transaminase or lactic acid dehydrogenase (LDH)    Unspecified essential hypertension     Past Surgical History:  Procedure Laterality Date   APPENDECTOMY  1965    Family History  Problem Relation Age of Onset   COPD Mother    Emphysema Mother    Heart disease Father        heavy smoker, 47   Coronary artery disease Father    Heart attack Father    Colon cancer Neg Hx     Social History   Tobacco Use   Smoking status: Never   Smokeless tobacco: Never  Substance Use Topics   Alcohol use: Yes    Alcohol/week: 1.0 standard drink    Types: 1 Standard drinks or equivalent per week   Drug use: No    No current facility-administered medications for this encounter.  Current Outpatient Medications:    albuterol (VENTOLIN HFA) 108 (90 Base) MCG/ACT inhaler, Inhale 1-2 puffs into the lungs every 4 (four) hours as needed for wheezing or shortness of breath., Disp:  1 each, Rfl: 0   chlorpheniramine-HYDROcodone (TUSSIONEX PENNKINETIC ER) 10-8 MG/5ML SUER, Take 5 mLs by mouth every 12 (twelve) hours as needed for cough., Disp: 60 mL, Rfl: 0   predniSONE (DELTASONE) 20 MG tablet, Take 2 tablets (40 mg total) by mouth daily with breakfast for 5 days., Disp: 10 tablet, Rfl: 0   Spacer/Aero-Holding Chambers (AEROCHAMBER PLUS) inhaler, Use with inhaler, Disp: 1 each, Rfl: 2   clobetasol cream (TEMOVATE) 0.99 %, Apply 1 application topically 2 (two) times daily. Eczema for up to 7-10 days, Disp: 60 g, Rfl: 2   Coenzyme Q10 150 MG CAPS, Take 150 capsules by mouth daily., Disp: , Rfl:    EPINEPHrine 0.3 mg/0.3 mL IJ SOAJ injection, Inject 0.3 mLs (0.3 mg total) into the muscle as needed for anaphylaxis., Disp: 2 each, Rfl: 1   fluticasone (FLONASE) 50 MCG/ACT nasal spray, Place 2 sprays into both nostrils daily., Disp: 48 g, Rfl: 3   glucose blood (ACCU-CHEK AVIVA) test strip, Check blood sugar daily as needed., Disp: 100 each, Rfl: 3   glucose blood test strip, 1 each by Other route daily. Use as instructed, Disp: 100 each, Rfl: 12   Lancet Devices (ACCU-CHEK SOFTCLIX) lancets, 1 each by Other route daily. Use as instructed, Disp: 1 each, Rfl: 11   lisinopril (ZESTRIL) 40 MG tablet, TAKE 1  TABLET(40 MG) BY MOUTH DAILY, Disp: 90 tablet, Rfl: 3   lovastatin (MEVACOR) 10 MG tablet, Take 1 tablet (10 mg total) by mouth once a week., Disp: 12 tablet, Rfl: 3   Multiple Vitamin (MULTIVITAMIN) tablet, Take 1 tablet by mouth daily., Disp: , Rfl:    Omega-3 Fatty Acids (RA FISH OIL) 1000 MG CAPS, Take by mouth., Disp: , Rfl:    tamsulosin (FLOMAX) 0.4 MG CAPS capsule, Take 1 capsule (0.4 mg total) by mouth daily after breakfast., Disp: 30 capsule, Rfl: 0   vitamin B-12 (CYANOCOBALAMIN) 500 MCG tablet, Take 500 mcg by mouth daily., Disp: , Rfl:   Allergies  Allergen Reactions   Amlodipine     gingival hyperplasia      ROS  As noted in HPI.   Physical Exam  BP (!)  168/95 (BP Location: Right Arm)   Pulse 63   Temp 98 F (36.7 C) (Oral)   Resp 18   SpO2 95%   Constitutional: Well developed, well nourished, no acute distress Eyes:  EOMI, conjunctiva normal bilaterally HENT: Normocephalic, atraumatic,mucus membranes moist.  Positive clear nasal congestion.  Erythematous, swollen turbinates.  No maxillary, frontal sinus tenderness.  Unable to completely visualize oropharynx Respiratory: Normal inspiratory effort.  Diffuse wheezing and scattered rhonchi.  No anterior, lateral chest wall tenderness Cardiovascular: Normal rate, regular rhythm, no murmurs rubs or gallops. GI: nondistended skin: No rash, skin intact Musculoskeletal: no deformities Neurologic: Alert & oriented x 3, no focal neuro deficits Psychiatric: Speech and behavior appropriate   ED Course   Medications - No data to display  No orders of the defined types were placed in this encounter.   No results found for this or any previous visit (from the past 24 hour(s)). No results found.  ED Clinical Impression  1. Acute bronchitis, unspecified organism   2. Acute upper respiratory infection      ED Assessment/Plan  [place] Narcotic database reviewed for this patient, and feel that the risk/benefit ratio today is favorable for proceeding with a prescription for controlled substance.  Presentation consistent with a bronchitis.  Deferring a chest x-ray in the absence of fevers, hypoxia or focal lung findings.  Sending home with an albuterol inhaler with a spacer, Tussionex, prednisone 40 mg for 5 days.  He is to start daily nasal irrigation, regular Mucinex and continue his Flonase.  May return here or see his doctor in 4 days if not getting any better, sooner if he gets worse, and we can consider antibiotics at that time.  Discussed MDM, treatment plan, and plan for follow-up with patient.  patient agrees with plan.   Meds ordered this encounter  Medications   albuterol (VENTOLIN  HFA) 108 (90 Base) MCG/ACT inhaler    Sig: Inhale 1-2 puffs into the lungs every 4 (four) hours as needed for wheezing or shortness of breath.    Dispense:  1 each    Refill:  0   Spacer/Aero-Holding Chambers (AEROCHAMBER PLUS) inhaler    Sig: Use with inhaler    Dispense:  1 each    Refill:  2    Please educate patient on use   chlorpheniramine-HYDROcodone (TUSSIONEX PENNKINETIC ER) 10-8 MG/5ML SUER    Sig: Take 5 mLs by mouth every 12 (twelve) hours as needed for cough.    Dispense:  60 mL    Refill:  0   predniSONE (DELTASONE) 20 MG tablet    Sig: Take 2 tablets (40 mg total) by mouth daily with breakfast  for 5 days.    Dispense:  10 tablet    Refill:  0      *This clinic note was created using [employer]. Therefore, there may be occasional mistakes despite careful proofreading.  ?    Melynda Ripple, MD 09/22/21 [phone]

## 2021-09-21 NOTE — Discharge Instructions (Addendum)
2 puffs from your albuterol inhaler with a spacer every 4 hours for 2 days, then every 6 hours for 2 days, then as needed.  May back off on the albuterol if you start to feel better., Tussionex for the cough, prednisone 40 mg for 5 days.  Start saline nasal irrigation with a Milta Deiters Med rinse and distilled water as often as you want.  Regular Mucinex and continue Flonase.  May return here or see your doctor in 4 days if not getting any better and we can consider antibiotics at that time.

## 2021-09-21 NOTE — ED Triage Notes (Signed)
Congestion and nasal drainage since Thursday.  Cough and congestion has gotten worse since Monday.  Over the counter products have not been effective for symptoms

## 2022-02-23 NOTE — Progress Notes (Signed)
? ?Phone: [phone] ?  ?Subjective:  ?Patient presents today for their annual physical. Chief complaint-noted.  ? ?See problem oriented charting- ?ROS- full  review of systems was completed and negative  ?except for: pain at top of buttocks when bends over after shower, back pain, neck pain ? ?The following were reviewed and entered/updated in epic: ?Past Medical History:  ?Diagnosis Date  ? Arthritis   ? Diabetes mellitus   ? diet controlled  ? Hx of adenomatous polyp of colon 04/28/2017  ? Mixed hyperlipidemia   ? Nonspecific elevation of levels of transaminase or lactic acid dehydrogenase (LDH)   ? Unspecified essential hypertension   ? ?Patient Active Problem List  ? Diagnosis Date Noted  ? Diabetes mellitus type II, controlled ([place]) 11/11/2010  ?  Priority: High  ? BPH associated with nocturia 09/14/2021  ?  Priority: Medium   ? Atherosclerosis of aorta ([place]) 10/15/2020  ?  Priority: Medium   ? 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  ?  Priority: Medium   ? Hx of adenomatous polyp of colon 04/28/2017  ?  Priority: Medium   ? Hyperlipidemia associated with type 2 diabetes mellitus ([place]) 12/17/2007  ?  Priority: Medium   ? Essential hypertension 12/17/2007  ?  Priority: Medium   ? Piriformis syndrome of right side 08/19/2019  ?  Priority: Low  ? Bee sting allergy 07/04/2019  ?  Priority: Low  ? Situational anxiety 07/22/2010  ?  Priority: Low  ? ?Past Surgical History:  ?Procedure Laterality Date  ? APPENDECTOMY  1965  ? ? ?Family History  ?Problem Relation Age of Onset  ? COPD Mother   ? Emphysema Mother   ? Heart disease Father   ?     heavy smoker, 7  ? Coronary artery disease Father   ? Heart attack Father   ? Colon cancer Neg Hx   ? ? ?Medications- reviewed and updated ?Current Outpatient Medications  ?Medication Sig Dispense Refill  ? clobetasol cream (TEMOVATE) 0.98 % Apply 1 application topically 2 (two) times daily. Eczema for up to 7-10 days 60 g 2  ? Coenzyme Q10 150 MG CAPS Take 150 capsules by mouth  daily.    ? EPINEPHrine 0.3 mg/0.3 mL IJ SOAJ injection Inject 0.3 mLs (0.3 mg total) into the muscle as needed for anaphylaxis. 2 each 1  ? fluticasone (FLONASE) 50 MCG/ACT nasal spray Place 2 sprays into both nostrils daily. 48 g 3  ? glucose blood (ACCU-CHEK AVIVA) test strip Check blood sugar daily as needed. 100 each 3  ? glucose blood test strip 1 each by Other route daily. Use as instructed 100 each 12  ? Lancet Devices (ACCU-CHEK SOFTCLIX) lancets 1 each by Other route daily. Use as instructed 1 each 11  ? lisinopril (ZESTRIL) 40 MG tablet TAKE 1 TABLET(40 MG) BY MOUTH DAILY 90 tablet 3  ? lovastatin (MEVACOR) 10 MG tablet Take 1 tablet (10 mg total) by mouth once a week. 12 tablet 3  ? Multiple Vitamin (MULTIVITAMIN) tablet Take 1 tablet by mouth daily.    ? Omega-3 Fatty Acids (RA FISH OIL) 1000 MG CAPS Take by mouth.    ? tamsulosin (FLOMAX) 0.4 MG CAPS capsule Take 1 capsule (0.4 mg total) by mouth daily after breakfast. 30 capsule 0  ? vitamin B-12 (CYANOCOBALAMIN) 500 MCG tablet Take 500 mcg by mouth daily.    ? albuterol (VENTOLIN HFA) 108 (90 Base) MCG/ACT inhaler Inhale 1-2 puffs into the lungs every 4 (four) hours as needed  for wheezing or shortness of breath. (Patient not taking: Reported on 03/31/2022) 1 each 0  ? Spacer/Aero-Holding Chambers (AEROCHAMBER PLUS) inhaler Use with inhaler (Patient not taking: Reported on 03/31/2022) 1 each 2  ? ?No current facility-administered medications for this visit.  ? ? ?Allergies-reviewed and updated ?Allergies  ?Allergen Reactions  ? Amlodipine   ?  gingival hyperplasia   ? ? ?Social History  ? ?Social History Narrative  ? Married 2007. No kids. 3 yorkies and [employer]  ? [employer]   ?   ? [employer]- assembly work. 5 days a week now.   ? Last job-Work [employer]  ?   ? 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 ?Objective  ?Objective:  ?BP 136/82   Pulse  (!) 58   Temp 98.1 ?F (36.7 ?C) (Temporal)   Resp 16   Ht '5\' 6"'$  (1.676 m)   Wt 156 lb 3.2 oz (70.9 kg)   SpO2 99%   BMI 25.21 kg/m?  ?Gen: NAD, resting comfortably ?HEENT: Mucous membranes are moist. Oropharynx normal ?Neck: no thyromegaly ?CV: RRR no murmurs rubs or gallops ?Lungs: CTAB no crackles, wheeze, rhonchi ?Abdomen: soft/nontender/nondistended/normal bowel sounds. No rebound or guarding.  ?Ext: no edema ?Skin: warm, dry, slight irritation top of buttocks- mildly red- advised trial vaseline- if worsens see [place] back or can get back into derm ?Neuro: grossly normal, moves all extremities, PERRLA ?  ?Diabetic Foot Exam - Simple   ?Simple Foot Form ?Diabetic Foot exam was performed with the following findings: Yes 03/31/2022  8:48 AM  ?Visual Inspection ?No deformities, no ulcerations, no other skin breakdown bilaterally: Yes ?Sensation Testing ?Intact to touch and monofilament testing bilaterally: Yes ?Pulse Check ?Posterior Tibialis and Dorsalis pulse intact bilaterally: Yes ?Comments ?  ?   ?  ?Assessment and Plan  ?61 y.o. male presenting for annual physical.  ?Health Maintenance counseling: ?1. Anticipatory guidance: Patient counseled regarding regular dental exams q6 months- -every 3 months due to bone loss (has fallen off lately- plans to call), eye exams -yearly- has upcoming and has had yearly- sign ROI,  avoiding smoking and second hand smoke , limiting alcohol to 2 beverages per day-once a month maximum or less .  No illicit drugs.  ?2. Risk factor reduction:  Advised patient of need for regular exercise and diet rich and fruits and vegetables to reduce risk of heart attack and stroke.  ?Exercise- limited by work hours in past and back issues- active with work but my add- has elliptical and treadmill and some dumbbells.  ?Diet/weight management-great job with weight loss. In addition started with dietician on 2nd week and finds very helpful.  ?Wt Readings from Last 3 Encounters:  ?03/31/22 156 lb  3.2 oz (70.9 kg)  ?09/14/21 165 lb 12.8 oz (75.2 kg)  ?03/26/21 161 lb 3.2 oz (73.1 kg)  ?3. Immunizations/screenings/ancillary studies ?DISCUSSED:  ?-COVID booster vaccine - planning to hold off ?-Vision Exam - sign ROI ?Immunization History  ?Administered Date(s) Administered  ? Influenza,inj,Quad PF,6+ Mos 11/01/2013, 01/01/2019, 09/25/2020, 09/14/2021  ? Moderna Sars-Covid-2 Vaccination 02/06/2020, 03/07/2020  ? Pneumococcal Conjugate-13 11/01/2013  ? Pneumococcal Polysaccharide-23 08/01/2018  ? Td 11/28/2006  ? Tdap 03/03/2017  ? Zoster Recombinat (Shingrix) 01/01/2019, 04/04/2019  ?4. Prostate cancer screening- low risk prior PSA trend-continue to trend with labs.   ?Lab Results  ?Component Value Date  ? PSA 1.40 03/26/2021  ? PSA 1.46 01/06/2020  ?  PSA 1.25 01/01/2019  ? 5. Colon cancer screening - 04/21/17 with a 5 year repeat planned - referred today ?6. Skin cancer screening-  saw derm - they continued steroid cream for rash/possible eczema.  Has also done full skin exam at initial exam. advised regular sunscreen use. Denies worrisome, changing, or new skin lesions.  ?7. Smoking associated screening (lung cancer screening, AAA screen 65-75, UA)- never smoker ?8. STD screening -not needed as only active with wife and no concern for infidelity  ? ?Status of chronic or acute concerns  ? ?#Diabetes mellitus-with hypertension and hyperlipidemia ?S: Patient been diet and exercise controlled. ?Lab Results  ?Component Value Date  ? HGBA1C 6.0 09/14/2021  ? HGBA1C 6.0 03/26/2021  ? HGBA1C 5.6 09/25/2020  ? A/P: hopefully stable or improved- update a1c today. Continue current meds for now ? ? ?#Hyperlipidemia associated with diabetes ?#aortic atherosclerosis ?S:medication: lovastatin 10 mg once a week ? Patient been intolerant to statin due to myalgias in the past even atorvastatin 40 mg once a week.  ?Lab Results  ?Component Value Date  ? CHOL 210 (H) 03/26/2021  ? HDL 36.20 (L) 03/26/2021  ? [place] 135 (H)  03/26/2021  ? LDLDIRECT 143.0 09/14/2021  ? TRIG 192.0 (H) 03/26/2021  ? CHOLHDL 6 03/26/2021  ? A/P: less than ideal control in past but tolerable dose- will likely continue current meds unless worsens- update lipid p

## 2022-03-31 ENCOUNTER — Ambulatory Visit (INDEPENDENT_AMBULATORY_CARE_PROVIDER_SITE_OTHER): Payer: 59 | Admitting: Family Medicine

## 2022-03-31 ENCOUNTER — Encounter: Payer: Self-pay | Admitting: Family Medicine

## 2022-03-31 VITALS — BP 136/82 | HR 58 | Temp 98.1°F | Resp 16 | Ht 66.0 in | Wt 156.2 lb

## 2022-03-31 DIAGNOSIS — Z125 Encounter for screening for malignant neoplasm of prostate: Secondary | ICD-10-CM

## 2022-03-31 DIAGNOSIS — E785 Hyperlipidemia, unspecified: Secondary | ICD-10-CM

## 2022-03-31 DIAGNOSIS — I700 Atherosclerosis of aorta: Secondary | ICD-10-CM

## 2022-03-31 DIAGNOSIS — Z86010 Personal history of colonic polyps: Secondary | ICD-10-CM

## 2022-03-31 DIAGNOSIS — Z0000 Encounter for general adult medical examination without abnormal findings: Secondary | ICD-10-CM

## 2022-03-31 DIAGNOSIS — G720 Drug-induced myopathy: Secondary | ICD-10-CM

## 2022-03-31 DIAGNOSIS — I10 Essential (primary) hypertension: Secondary | ICD-10-CM

## 2022-03-31 DIAGNOSIS — E1169 Type 2 diabetes mellitus with other specified complication: Secondary | ICD-10-CM

## 2022-03-31 DIAGNOSIS — E119 Type 2 diabetes mellitus without complications: Secondary | ICD-10-CM

## 2022-03-31 DIAGNOSIS — Z860101 Personal history of adenomatous and serrated colon polyps: Secondary | ICD-10-CM

## 2022-03-31 LAB — COMPREHENSIVE METABOLIC PANEL
ALT: 33 U/L (ref 0–53)
AST: 31 U/L (ref 0–37)
Albumin: 5 g/dL (ref 3.5–5.2)
Alkaline Phosphatase: 73 U/L (ref 39–117)
BUN: 19 mg/dL (ref 6–23)
CO2: 28 mEq/L (ref 19–32)
Calcium: 10 mg/dL (ref 8.4–10.5)
Chloride: 100 mEq/L (ref 96–112)
Creatinine, Ser: 0.88 mg/dL (ref 0.40–1.50)
GFR: 91.82 mL/min (ref >60.00)
Glucose, Bld: 87 mg/dL (ref 70–99)
Potassium: 4.4 mEq/L (ref 3.5–5.1)
Sodium: 138 mEq/L (ref 135–145)
Total Bilirubin: 0.9 mg/dL (ref 0.2–1.2)
Total Protein: 7.8 g/dL (ref 6.0–8.3)

## 2022-03-31 LAB — CBC WITH DIFFERENTIAL/PLATELET
Basophils Absolute: 0 10*3/uL (ref 0.0–0.1)
Basophils Relative: 0.3 % (ref 0.0–3.0)
Eosinophils Absolute: 0.1 10*3/uL (ref 0.0–0.7)
Eosinophils Relative: 2.1 % (ref 0.0–5.0)
HCT: 45.1 % (ref 39.0–52.0)
Hemoglobin: 15.3 g/dL (ref 13.0–17.0)
Lymphocytes Relative: 26.7 % (ref 12.0–46.0)
Lymphs Abs: 1.9 10*3/uL (ref 0.7–4.0)
MCHC: 33.8 g/dL (ref 30.0–36.0)
MCV: 86.8 fl (ref 78.0–100.0)
Monocytes Absolute: 0.6 10*3/uL (ref 0.1–1.0)
Monocytes Relative: 8.3 % (ref 3.0–12.0)
Neutro Abs: 4.4 10*3/uL (ref 1.4–7.7)
Neutrophils Relative %: 62.6 % (ref 43.0–77.0)
Platelets: 289 10*3/uL (ref 150.0–400.0)
RBC: 5.2 Mil/uL (ref 4.22–5.81)
RDW: 13.4 % (ref 11.5–15.5)
WBC: 7 10*3/uL (ref 4.0–10.5)

## 2022-03-31 LAB — LIPID PANEL
Cholesterol: 198 mg/dL (ref 0–200)
HDL: 40.7 mg/dL (ref >39.00)
LDL Cholesterol: 137 mg/dL — ABNORMAL HIGH (ref 0–99)
NonHDL: 157.26
Total CHOL/HDL Ratio: 5
Triglycerides: 102 mg/dL (ref 0.0–149.0)
VLDL: 20.4 mg/dL (ref 0.0–40.0)

## 2022-03-31 LAB — PSA: PSA: 1.44 ng/mL (ref 0.10–4.00)

## 2022-03-31 LAB — HEMOGLOBIN A1C: Hgb A1c MFr Bld: 5.8 % (ref 4.6–6.5)

## 2022-03-31 NOTE — Patient Instructions (Addendum)
Sign release of information at the check out desk for last diabetic eye exam ? ?[facility] contact ?Please call to schedule visit and/or procedure ?Address: [address], [place], [place] [address] ?Phone: [phone]  ? ?I like your idea of seeing Dr Junius Roads. I can refer either to orthopedics or Dr. Georgina Snell with sports medicine ? ?Please stop by lab before you go ?If you have mychart- we will send your results within 3 business days of [place] receiving them.  ?If you do not have mychart- we will call you about results within 5 business days of [place] receiving them.  ?*please also note that you will see labs on mychart as soon as they post. I will later go in and write notes on them- will say "notes from Dr. Yong Channel"  ? ?Recommended follow up: Return in about 6 months (around 10/01/2022) for physical or sooner if needed.Schedule b4 you leave. ?

## 2022-04-26 ENCOUNTER — Other Ambulatory Visit: Payer: Self-pay | Admitting: Family Medicine

## 2022-05-24 ENCOUNTER — Ambulatory Visit (AMBULATORY_SURGERY_CENTER): Payer: Self-pay | Admitting: *Deleted

## 2022-05-24 VITALS — Ht 66.0 in | Wt 157.0 lb

## 2022-05-24 DIAGNOSIS — Z86010 Personal history of colonic polyps: Secondary | ICD-10-CM

## 2022-06-24 ENCOUNTER — Encounter: Payer: Self-pay | Admitting: Internal Medicine

## 2022-06-29 ENCOUNTER — Encounter: Payer: Self-pay | Admitting: Internal Medicine

## 2022-06-29 ENCOUNTER — Ambulatory Visit (AMBULATORY_SURGERY_CENTER): Payer: 59 | Admitting: Internal Medicine

## 2022-06-29 VITALS — BP 152/80 | HR 53 | Temp 98.2°F | Resp 15 | Ht 66.0 in | Wt 157.0 lb

## 2022-06-29 DIAGNOSIS — Z09 Encounter for follow-up examination after completed treatment for conditions other than malignant neoplasm: Secondary | ICD-10-CM

## 2022-06-29 DIAGNOSIS — Z86010 Personal history of colonic polyps: Secondary | ICD-10-CM

## 2022-06-29 DIAGNOSIS — D124 Benign neoplasm of descending colon: Secondary | ICD-10-CM | POA: Diagnosis not present

## 2022-06-29 MED ORDER — SODIUM CHLORIDE 0.9 % IV SOLN: 500.0000 mL | Freq: Once | INTRAVENOUS | Status: DC

## 2022-06-29 NOTE — Progress Notes (Signed)
[facility] History and Physical   Primary Care Physician:  Marin Olp, MD   Reason for Procedure:   Hx colon adenoma  Plan:    colonoscopy     HPI: Alex [name] is a 61 y.o. male w/ diminutive colon adenoma x 1 removed 2018 C/o cccasional hemorrhoid irritation - not currently  Past Medical History:  Diagnosis Date   Allergy    flonase   Anxiety    OCC   Arthritis    Diabetes mellitus    diet controlled   GERD (gastroesophageal reflux disease)    past hx lost weight   Hemorrhoids    occ inflammation   Hx of adenomatous polyp of colon 04/28/2017   Mixed hyperlipidemia    Nonspecific elevation of levels of transaminase or lactic acid dehydrogenase (LDH)    Unspecified essential hypertension     Past Surgical History:  Procedure Laterality Date   APPENDECTOMY  1965   COLONOSCOPY     POLYPECTOMY      Prior to Admission medications   Medication Sig Start Date End Date Taking? Authorizing Provider  clobetasol cream (TEMOVATE) 5.63 % Apply 1 application topically 2 (two) times daily. Eczema for up to 7-10 days 09/14/21  Yes Marin Olp, MD  Coenzyme Q10 150 MG CAPS Take 150 capsules by mouth daily.   Yes [provider]  fluticasone (FLONASE) 50 MCG/ACT nasal spray Place 2 sprays into both nostrils daily. 09/14/21  Yes Marin Olp, MD  lisinopril (ZESTRIL) 40 MG tablet TAKE 1 TABLET(40 MG) BY MOUTH DAILY 04/26/22  Yes Marin Olp, MD  lovastatin (MEVACOR) 10 MG tablet Take 1 tablet (10 mg total) by mouth once a week. 03/29/21  Yes Marin Olp, MD  Multiple Vitamin (MULTIVITAMIN) tablet Take 1 tablet by mouth daily.   Yes [provider]  Omega-3 Fatty Acids (RA FISH OIL) 1000 MG CAPS Take by mouth.   Yes [provider]  vitamin B-12 (CYANOCOBALAMIN) 500 MCG tablet Take 500 mcg by mouth daily.   Yes [provider]  albuterol (VENTOLIN HFA) 108 (90 Base) MCG/ACT inhaler Inhale 1-2 puffs into  the lungs every 4 (four) hours as needed for wheezing or shortness of breath. Patient not taking: Reported on 03/31/2022 09/21/21   Melynda Ripple, MD  EPINEPHrine 0.3 mg/0.3 mL IJ SOAJ injection Inject 0.3 mLs (0.3 mg total) into the muscle as needed for anaphylaxis. 07/04/19   Marin Olp, MD  glucose blood (ACCU-CHEK AVIVA) test strip Check blood sugar daily as needed. 07/24/15   Marin Olp, MD  glucose blood test strip 1 each by Other route daily. Use as instructed 05/15/14   Swords, Darrick Penna, MD  Lancet Devices [facility]) lancets 1 each by Other route daily. Use as instructed 05/15/14   Swords, Darrick Penna, MD  Spacer/Aero-Holding Chambers (AEROCHAMBER PLUS) inhaler Use with inhaler 09/21/21   Melynda Ripple, MD  tamsulosin [facility]) 0.4 MG CAPS capsule Take 1 capsule (0.4 mg total) by mouth daily after breakfast. Patient not taking: Reported on 05/24/2022 10/09/20   Lestine Box, PA-C    Current Outpatient Medications  Medication Sig Dispense Refill   clobetasol cream (TEMOVATE) 8.75 % Apply 1 application topically 2 (two) times daily. Eczema for up to 7-10 days 60 g 2   Coenzyme Q10 150 MG CAPS Take 150 capsules by mouth daily.     fluticasone (FLONASE) 50 MCG/ACT nasal spray Place 2 sprays into both nostrils daily. 48 g 3  lisinopril (ZESTRIL) 40 MG tablet TAKE 1 TABLET(40 MG) BY MOUTH DAILY 90 tablet 3   lovastatin (MEVACOR) 10 MG tablet Take 1 tablet (10 mg total) by mouth once a week. 12 tablet 3   Multiple Vitamin (MULTIVITAMIN) tablet Take 1 tablet by mouth daily.     Omega-3 Fatty Acids (RA FISH OIL) 1000 MG CAPS Take by mouth.     vitamin B-12 (CYANOCOBALAMIN) 500 MCG tablet Take 500 mcg by mouth daily.     albuterol (VENTOLIN HFA) 108 (90 Base) MCG/ACT inhaler Inhale 1-2 puffs into the lungs every 4 (four) hours as needed for wheezing or shortness of breath. (Patient not taking: Reported on 03/31/2022) 1 each 0   EPINEPHrine 0.3 mg/0.3 mL IJ SOAJ injection  Inject 0.3 mLs (0.3 mg total) into the muscle as needed for anaphylaxis. 2 each 1   glucose blood (ACCU-CHEK AVIVA) test strip Check blood sugar daily as needed. 100 each 3   glucose blood test strip 1 each by Other route daily. Use as instructed 100 each 12   Lancet Devices (ACCU-CHEK SOFTCLIX) lancets 1 each by Other route daily. Use as instructed 1 each 11   Spacer/Aero-Holding Chambers (AEROCHAMBER PLUS) inhaler Use with inhaler 1 each 2   tamsulosin (FLOMAX) 0.4 MG CAPS capsule Take 1 capsule (0.4 mg total) by mouth daily after breakfast. (Patient not taking: Reported on 05/24/2022) 30 capsule 0   Current Facility-Administered Medications  Medication Dose Route Frequency Provider Last Rate Last Admin   0.9 %  sodium chloride infusion  500 mL Intravenous Once Gatha Mayer, MD        Allergies as of 06/29/2022 - Review Complete 06/29/2022  Allergen Reaction Noted   Amlodipine  08/01/2018    Family History  Problem Relation Age of Onset   COPD Mother    Emphysema Mother    Heart disease Father        heavy smoker, 34   Coronary artery disease Father    Heart attack Father    Colon cancer Neg Hx    Colon polyps Neg Hx    Esophageal cancer Neg Hx    Rectal cancer Neg Hx    Stomach cancer Neg Hx     Social History   Socioeconomic History   Marital status: Married    Spouse name: Not on file   Number of children: Not on file   Years of education: Not on file   Highest education level: Not on file  Occupational History   Not on file  Tobacco Use   Smoking status: Never   Smokeless tobacco: Never  Vaping Use   Vaping Use: Never used  Substance and Sexual Activity   Alcohol use: Yes    Alcohol/week: 1.0 standard drink of alcohol    Types: 1 Standard drinks or equivalent per week    Comment: occ   Drug use: No   Sexual activity: Not on file  Other Topics Concern   Not on file  Social History Narrative   Married 2007. No kids. 3 yorkies and maltese-yorkie mix    [employer] work. 5 days a week now.    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  Physical Exam: Vital signs BP (!) 147/87   Pulse (!) 59   Temp 98.2 F (36.8 C) (Temporal)   Ht '5\' 6"'$  (1.676 m)   Wt 157 lb (71.2 kg)   SpO2 97%   BMI 25.34 kg/m   General:   Alert,  Well-developed, well-nourished, pleasant and cooperative in NAD Lungs:  Clear throughout to auscultation.   Heart:  Regular rate and rhythm; no murmurs, clicks, rubs,  or gallops. Abdomen:  Soft, nontender and nondistended. Normal bowel sounds.   Neuro/Psych:  Alert and cooperative. Normal mood and affect. A and O x 3   '@Maleko Greulich'$  Simonne Maffucci, MD, [facility] [phone] (pager) 06/29/2022 9:11 AM@

## 2022-06-29 NOTE — Progress Notes (Signed)
VS completed by DT.  Pt's states no medical or surgical changes since previsit or office visit.  

## 2022-06-29 NOTE — Patient Instructions (Addendum)
I found and removed one tiny polyp. I will let you know pathology results and when to have another routine colonoscopy by mail and/or My Chart.  Note that recently updated guidelines may give you more time before next colonoscopy than last recommendation.  I appreciate the opportunity to care for you. Gatha Mayer, MD, FACG  YOU HAD AN ENDOSCOPIC PROCEDURE TODAY AT [facility]:   Refer to the procedure report that was given to you for any specific questions about what was found during the examination.  If the procedure report does not answer your questions, please call your gastroenterologist to clarify.  If you requested that your care partner not be given the details of your procedure findings, then the procedure report has been included in a sealed envelope for you to review at your convenience later.  YOU SHOULD EXPECT: Some feelings of bloating in the abdomen. Passage of more gas than usual.  Walking can help get rid of the air that was put into your GI tract during the procedure and reduce the bloating. If you had a lower endoscopy (such as a colonoscopy or flexible sigmoidoscopy) you may notice spotting of blood in your stool or on the toilet paper. If you underwent a bowel prep for your procedure, you may not have a normal bowel movement for a few days.  Please Note:  You might notice some irritation and congestion in your nose or some drainage.  This is from the oxygen used during your procedure.  There is no need for concern and it should clear up in a day or so.  SYMPTOMS TO REPORT IMMEDIATELY:  Following lower endoscopy (colonoscopy or flexible sigmoidoscopy):  Excessive amounts of blood in the stool  Significant tenderness or worsening of abdominal pains  Swelling of the abdomen that is new, acute  Fever of 100F or higher  Following upper endoscopy (EGD)  Vomiting of blood or coffee ground material  New chest pain or pain under the shoulder blades  Painful  or persistently difficult swallowing  New shortness of breath  Fever of 100F or higher  Black, tarry-looking stools  For urgent or emergent issues, a gastroenterologist can be reached at any hour by calling [phone]. Do not use MyChart messaging for urgent concerns.    DIET:  We do recommend a small meal at first, but then you may proceed to your regular diet.  Drink plenty of fluids but you should avoid alcoholic beverages for 24 hours.  ACTIVITY:  You should plan to take it easy for the rest of today and you should NOT DRIVE or use heavy machinery until tomorrow (because of the sedation medicines used during the test).    FOLLOW UP: Our staff will call the number listed on your records the next business day following your procedure.  We will call around 7:15- 8:00 am to check on you and address any questions or concerns that you may have regarding the information given to you following your procedure. If we do not reach you, we will leave a message.  If you develop any symptoms (ie: fever, flu-like symptoms, shortness of breath, cough etc.) before then, please call [phone].  If you test positive for Covid 19 in the 2 weeks post procedure, please call and report this information to [place].    If any biopsies were taken you will be contacted by phone or by letter within the next 1-3 weeks.  Please call us at [phone] if you have  not heard about the biopsies in 3 weeks.    SIGNATURES/CONFIDENTIALITY: You and/or your care partner have signed paperwork which will be entered into your electronic medical record.  These signatures attest to the fact that that the information above on your After Visit Summary has been reviewed and is understood.  Full responsibility of the confidentiality of this discharge information lies with you and/or your care-partner.

## 2022-06-29 NOTE — Op Note (Signed)
[facility] Patient Name: Alex [name] Procedure Date: 06/29/2022 9:11 AM MRN: [id number] Endoscopist: Gatha Mayer , MD Age: 61 Referring MD:  Date of Birth: [birth year] Gender: Male Account #: [id number] Procedure:                Colonoscopy Indications:              Surveillance: Personal history of adenomatous                            polyps on last colonoscopy 5 years ago Medicines:                Monitored Anesthesia Care Procedure:                Pre-Anesthesia Assessment:                           - Prior to the procedure, a History and Physical                            was performed, and patient medications and                            allergies were reviewed. The patient's tolerance of                            previous anesthesia was also reviewed. The risks                            and benefits of the procedure and the sedation                            options and risks were discussed with the patient.                            All questions were answered, and informed consent                            was obtained. Prior Anticoagulants: The patient has                            taken no previous anticoagulant or antiplatelet                            agents. ASA Grade Assessment: II - A patient with                            mild systemic disease. After reviewing the risks                            and benefits, the patient was deemed in                            satisfactory condition to undergo the procedure.  After obtaining informed consent, the colonoscope                            was passed under direct vision. Throughout the                            procedure, the patient's blood pressure, pulse, and                            oxygen saturations were monitored continuously. The                            CF [id number] #[id number] was introduced through the anus                            and advanced to the the  cecum, identified by                            appendiceal orifice and ileocecal valve. The                            colonoscopy was performed without difficulty. The                            patient tolerated the procedure well. The quality                            of the bowel preparation was good. The ileocecal                            valve, appendiceal orifice, and rectum were                            photographed. The bowel preparation used was                            Miralax via split dose instruction. Scope In: 9:21:11 AM Scope Out: 9:35:59 AM Scope Withdrawal Time: 0 hours 12 minutes 4 seconds  Total Procedure Duration: 0 hours 14 minutes 48 seconds  Findings:                 The perianal and digital rectal examinations were                            normal. Pertinent negatives include normal prostate                            (size, shape, and consistency).                           A diminutive polyp was found in the descending                            colon. The polyp was sessile. The polyp was removed  with a cold snare. Resection and retrieval were                            complete. Verification of patient identification                            for the specimen was done. Estimated blood loss was                            minimal.                           Multiple diverticula were found in the sigmoid                            colon.                           The exam was otherwise without abnormality on                            direct and retroflexion views. Complications:            No immediate complications. Estimated Blood Loss:     Estimated blood loss was minimal. Impression:               - One diminutive polyp in the descending colon,                            removed with a cold snare. Resected and retrieved.                           - Diverticulosis in the sigmoid colon.                           - The  examination was otherwise normal on direct                            and retroflexion views.                           - Personal history of colonic polyp - diminutive                            adenoma 2018. Recommendation:           - Patient has a contact number available for                            emergencies. The signs and symptoms of potential                            delayed complications were discussed with the                            patient. Return to normal activities tomorrow.  Written discharge instructions were provided to the                            patient.                           - Resume previous diet.                           - Continue present medications.                           - Repeat colonoscopy is recommended for                            surveillance. The colonoscopy date will be                            determined after pathology results from today's                            exam become available for review. Gatha Mayer, MD 06/29/2022 9:44:56 AM This report has been signed electronically.

## 2022-06-29 NOTE — Progress Notes (Signed)
PT taken to PACU. Monitors in place. VSS. Report given to RN. 

## 2022-06-29 NOTE — Progress Notes (Signed)
Called to room to assist during endoscopic procedure.  Patient ID and intended procedure confirmed with present staff. Received instructions for my participation in the procedure from the performing physician.  

## 2022-06-30 ENCOUNTER — Telehealth: Payer: Self-pay | Admitting: *Deleted

## 2022-06-30 NOTE — Telephone Encounter (Signed)
  Follow up Call-     06/29/2022    8:49 AM  Call back number  Post procedure Call Back phone  # [phone]  Permission to leave phone message Yes     Patient questions:  Do you have a fever, pain , or abdominal swelling? No. Pain Score  0 *  Have you tolerated food without any problems? Yes.    Have you been able to return to your normal activities? Yes.    Do you have any questions about your discharge instructions: Diet   No. Medications  No. Follow up visit  No.  Do you have questions or concerns about your Care? No.  Actions: * If pain score is 4 or above: No action needed, pain <4.

## 2022-07-05 ENCOUNTER — Encounter: Payer: Self-pay | Admitting: Internal Medicine

## 2022-07-05 DIAGNOSIS — Z860101 Personal history of adenomatous and serrated colon polyps: Secondary | ICD-10-CM

## 2022-07-05 DIAGNOSIS — Z86010 Personal history of colonic polyps: Secondary | ICD-10-CM

## 2022-08-22 ENCOUNTER — Encounter: Payer: Self-pay | Admitting: *Deleted

## 2022-10-02 NOTE — Progress Notes (Unsigned)
Phone [phone] In person visit   Subjective:   Alex [name] is a 61 y.o. year old very pleasant male patient who presents for/with See problem oriented charting No chief complaint on file.   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    Priority: Medium    Hx of adenomatous polyp of colon 04/28/2017    Priority: Medium    Hyperlipidemia associated with type 2 diabetes mellitus ([place]) 12/17/2007    Priority: Medium    Essential hypertension 12/17/2007    Priority: Medium    Piriformis syndrome of right side 08/19/2019    Priority: Low   Bee sting allergy 07/04/2019    Priority: Low   Situational anxiety 07/22/2010    Priority: Low    Medications- reviewed and updated Current Outpatient Medications  Medication Sig Dispense Refill   albuterol (VENTOLIN HFA) 108 (90 Base) MCG/ACT inhaler Inhale 1-2 puffs into the lungs every 4 (four) hours as needed for wheezing or shortness of breath. (Patient not taking: Reported on 03/31/2022) 1 each 0   clobetasol cream (TEMOVATE) 8.29 % Apply 1 application topically 2 (two) times daily. Eczema for up to 7-10 days 60 g 2   Coenzyme Q10 150 MG CAPS Take 150 capsules by mouth daily.     EPINEPHrine 0.3 mg/0.3 mL IJ SOAJ injection Inject 0.3 mLs (0.3 mg total) into the muscle as needed for anaphylaxis. 2 each 1   fluticasone (FLONASE) 50 MCG/ACT nasal spray Place 2 sprays into both nostrils daily. 48 g 3   glucose blood (ACCU-CHEK AVIVA) test strip Check blood sugar daily as needed. 100 each 3   glucose blood test strip 1 each by Other route daily. Use as instructed 100 each 12   Lancet Devices (ACCU-CHEK SOFTCLIX) lancets 1 each by Other route daily. Use as instructed 1  each 11   lisinopril (ZESTRIL) 40 MG tablet TAKE 1 TABLET(40 MG) BY MOUTH DAILY 90 tablet 3   lovastatin (MEVACOR) 10 MG tablet Take 1 tablet (10 mg total) by mouth once a week. 12 tablet 3   Multiple Vitamin (MULTIVITAMIN) tablet Take 1 tablet by mouth daily.     Omega-3 Fatty Acids (RA FISH OIL) 1000 MG CAPS Take by mouth.     Spacer/Aero-Holding Chambers (AEROCHAMBER PLUS) inhaler Use with inhaler 1 each 2   tamsulosin (FLOMAX) 0.4 MG CAPS capsule Take 1 capsule (0.4 mg total) by mouth daily after breakfast. (Patient not taking: Reported on 05/24/2022) 30 capsule 0   vitamin B-12 (CYANOCOBALAMIN) 500 MCG tablet Take 500 mcg by mouth daily.     No current facility-administered medications for this visit.     Objective:  There were no vitals taken for this visit. Gen: NAD, resting comfortably CV: RRR no murmurs rubs or gallops Lungs: CTAB no crackles, wheeze, rhonchi Abdomen: soft/nontender/nondistended/normal bowel sounds. No rebound or guarding.  Ext: no edema Skin: warm, dry Neuro: grossly normal, moves all extremities  ***    Assessment and Plan   #Diabetes mellitus-with hypertension and hyperlipidemia S: Medication: none***,Patient has been diet and exercise controlled. Lab Results  Component Value Date   HGBA1C 5.8 03/31/2022   HGBA1C 6.0 09/14/2021   HGBA1C 6.0 03/26/2021  A/P: ***    #  Hyperlipidemia associated with diabetes #aortic atherosclerosis- prefer LDL under 70 if possible #statin myopathy S: medication: ***Patient willing to trial lovastatin 10 mg once a week -Patient has been intolerant to statin due to myalgias in the past even atorvastatin 40 mg once a week.   Lab Results  Component Value Date   CHOL 198 03/31/2022   HDL 40.70 03/31/2022   LDLCALC 137 (H) 03/31/2022   LDLDIRECT 143.0 09/14/2021   TRIG 102.0 03/31/2022   CHOLHDL 5 03/31/2022  A/P: ***    #Hypertension S: Compliant with lisinopril 40 mg BP Readings from Last 3 Encounters:   06/29/22 (!) 152/80  03/31/22 136/82  09/21/21 (!) 168/95  A/P: ***   # BPH with nocturia S:medication: tamsulosin 0.4 mg***  A/P: ***   #history of colon polyp - with Dr. Gessner-07/04/22 - Diminutive adenoma recall 7 years 2030***  Recommended follow up: ***No follow-ups on file. Future Appointments  Date Time Provider [place]  10/03/2022  8:00 AM Marin Olp, MD [facility]    Lab/Order associations:   ICD-10-CM   1. Controlled type 2 diabetes mellitus without complication, without long-term current use of insulin (HCC)  E11.9     2. Hyperlipidemia associated with type 2 diabetes mellitus ([place])  E11.69    E78.5     3. Essential hypertension  I10     4. Drug-induced myopathy  G72.0       No orders of the defined types were placed in this encounter.   Return precautions advised.  Garret Reddish, MD

## 2022-10-03 ENCOUNTER — Encounter: Payer: Self-pay | Admitting: Family Medicine

## 2022-10-03 ENCOUNTER — Ambulatory Visit: Payer: 59 | Admitting: Family Medicine

## 2022-10-03 VITALS — BP 138/90 | HR 60 | Temp 98.3°F | Ht 66.0 in | Wt 155.8 lb

## 2022-10-03 DIAGNOSIS — I10 Essential (primary) hypertension: Secondary | ICD-10-CM | POA: Diagnosis not present

## 2022-10-03 DIAGNOSIS — G720 Drug-induced myopathy: Secondary | ICD-10-CM

## 2022-10-03 DIAGNOSIS — Z23 Encounter for immunization: Secondary | ICD-10-CM | POA: Diagnosis not present

## 2022-10-03 DIAGNOSIS — E785 Hyperlipidemia, unspecified: Secondary | ICD-10-CM

## 2022-10-03 DIAGNOSIS — E119 Type 2 diabetes mellitus without complications: Secondary | ICD-10-CM | POA: Diagnosis not present

## 2022-10-03 DIAGNOSIS — E1169 Type 2 diabetes mellitus with other specified complication: Secondary | ICD-10-CM

## 2022-10-03 DIAGNOSIS — M542 Cervicalgia: Secondary | ICD-10-CM

## 2022-10-03 DIAGNOSIS — G8929 Other chronic pain: Secondary | ICD-10-CM

## 2022-10-03 DIAGNOSIS — M545 Low back pain, unspecified: Secondary | ICD-10-CM

## 2022-10-03 LAB — MICROALBUMIN / CREATININE URINE RATIO
Creatinine,U: 71.3 mg/dL
Microalb Creat Ratio: 1 mg/g (ref 0.0–30.0)
Microalb, Ur: <0.7 mg/dL (ref 0.0–1.9)

## 2022-10-03 LAB — POCT GLYCOSYLATED HEMOGLOBIN (HGB A1C): Hemoglobin A1C: 5.5 % (ref 4.0–5.6)

## 2022-10-03 MED ORDER — FLUTICASONE PROPIONATE 50 MCG/ACT NA SUSP: 2.0000 | Freq: Every day | NASAL | 3 refills | Status: DC

## 2022-10-03 NOTE — Addendum Note (Signed)
Addended by: Marin Olp on: 10/03/2022 08:29 AM   Modules accepted: Orders

## 2022-10-03 NOTE — Patient Instructions (Addendum)
Team will log flu shot- thanks for doing this today  Team please give him exercises for plantar fasciitis I want you to do the exercise 3x a week for a month then once a week for another month. Stop any exercise that causes more than 1-2/10 pain increase. If not doing better within 1-2 months let us refer you to sports medicine - or you can mention to them when you see them  We will call you within two weeks about your referral to [facility]. If you do not hear within 2 weeks, give [place] a call.   blood pressure slightly elevated in office - he is going to do some home monitoring and update [place] after about a week- suspect home readings will be better   Please stop by lab before you go- URINE only If you have mychart- we will send your results within 3 business days of [place] receiving them.  If you do not have mychart- we will call you about results within 5 business days of [place] receiving them.  *please also note that you will see labs on mychart as soon as they post. I will later go in and write notes on them- will say "notes from Dr. Yong Channel"   Recommended follow up: Return in about 6 months (around 04/03/2023) for physical or sooner if needed.Schedule b4 you leave.

## 2022-10-26 IMAGING — CT CT RENAL STONE PROTOCOL
2 of 4 series · 17 of 46 positions shown, 19 images · non-contrast
Comparison: None.

CLINICAL DATA: Left-sided flank pain

EXAM:
CT ABDOMEN AND PELVIS WITHOUT CONTRAST
TECHNIQUE: Multidetector CT imaging of the abdomen and pelvis was performed
following the standard protocol without IV contrast.

[Series 2: axial st · axial · 0.69mm/px · z∈[-394,-9]mm · 14 of 89 slices shown, 16 images]
[im 6/89  soft-tissue]
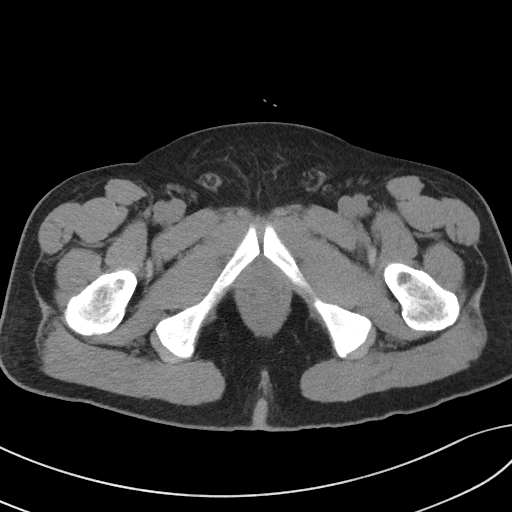
[im 6/89  bone]
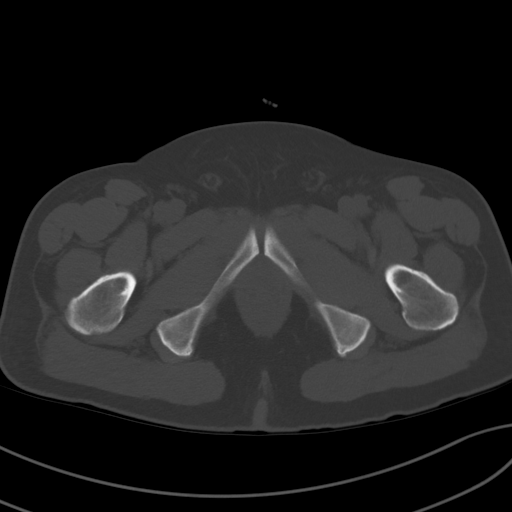
[im 12/89  soft-tissue]
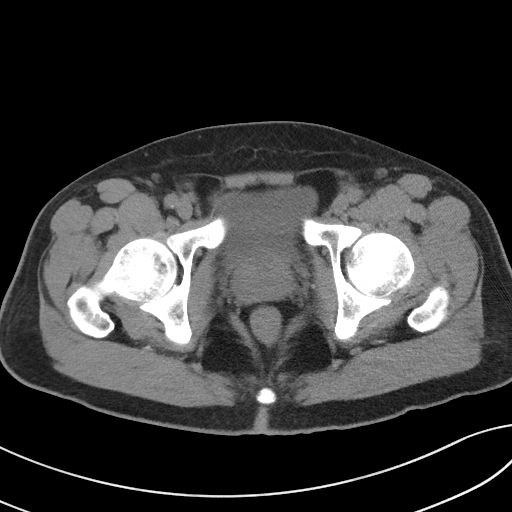
[im 17/89  soft-tissue]
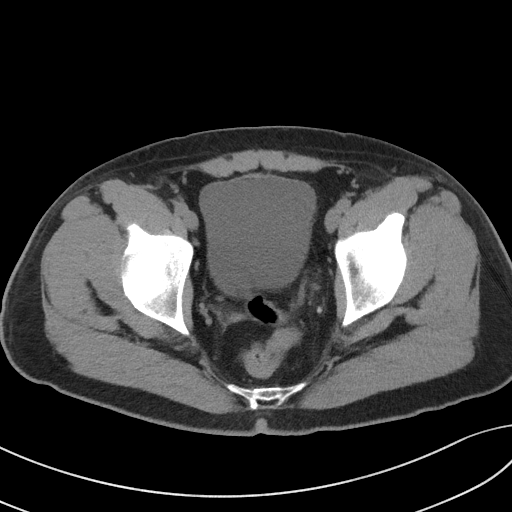
[im 23/89  soft-tissue]
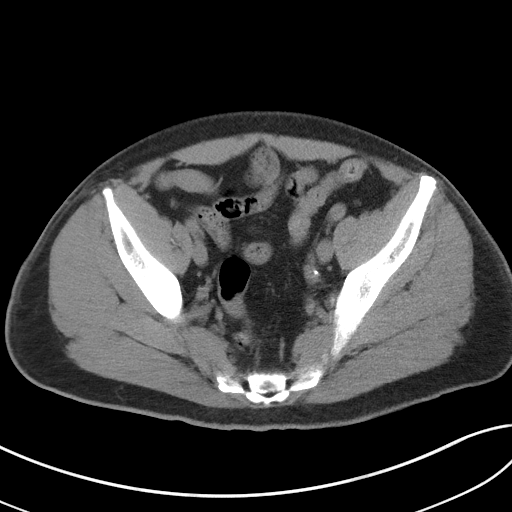
[im 28/89  soft-tissue]
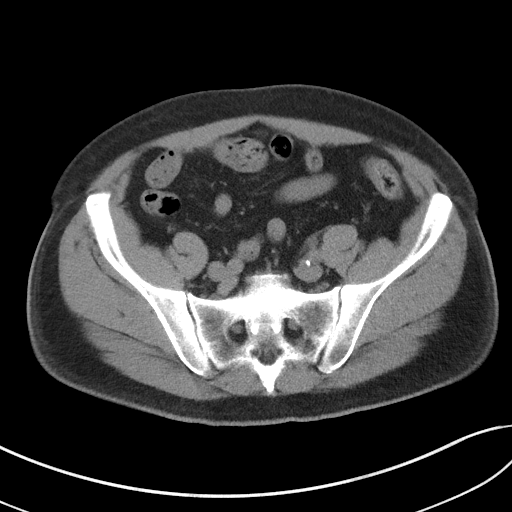
[im 34/89  soft-tissue]
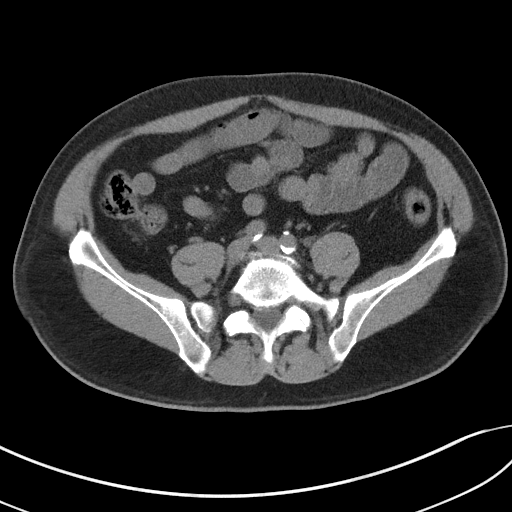
[im 39/89  soft-tissue]
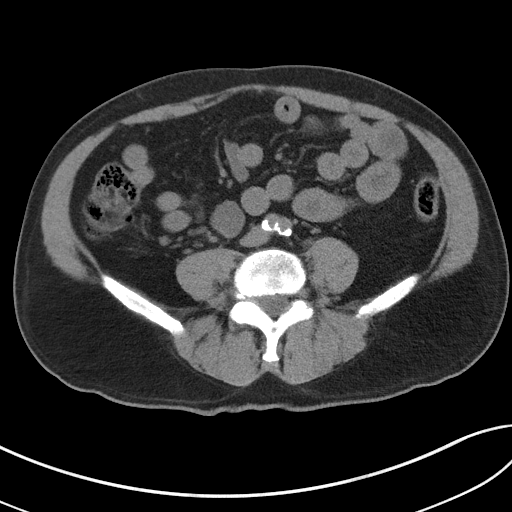
[im 50/89  soft-tissue]
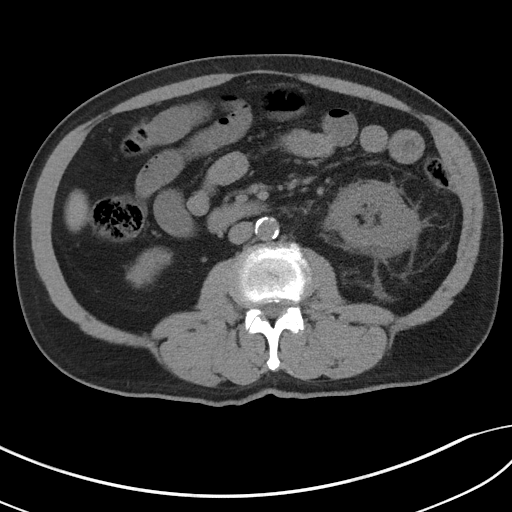
[im 56/89  soft-tissue]
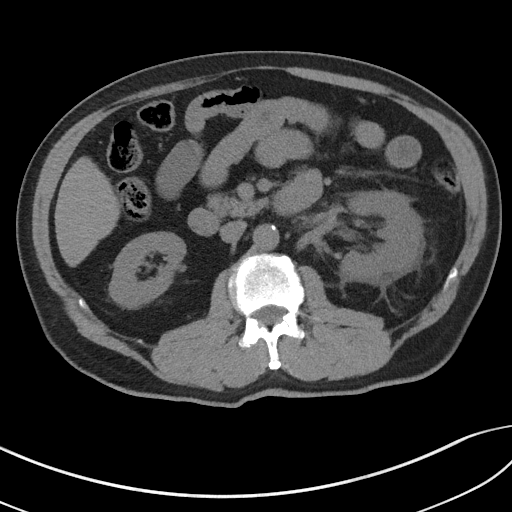
[im 56/89  bone]
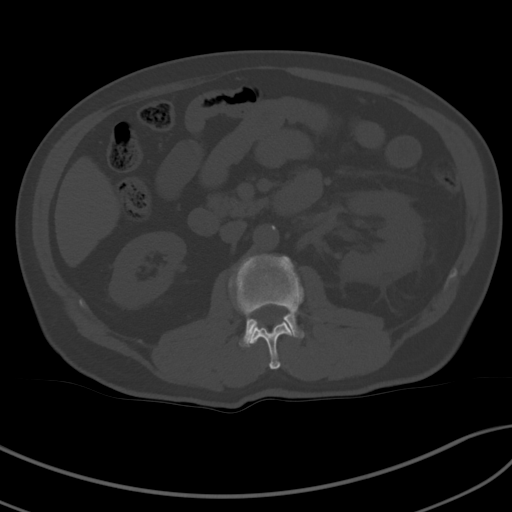
[im 61/89  soft-tissue]
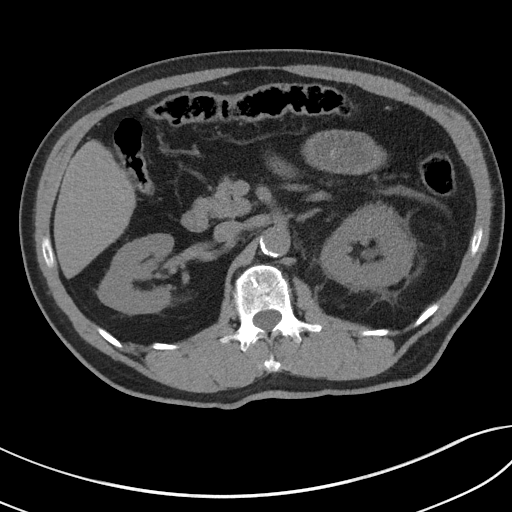
[im 67/89  soft-tissue]
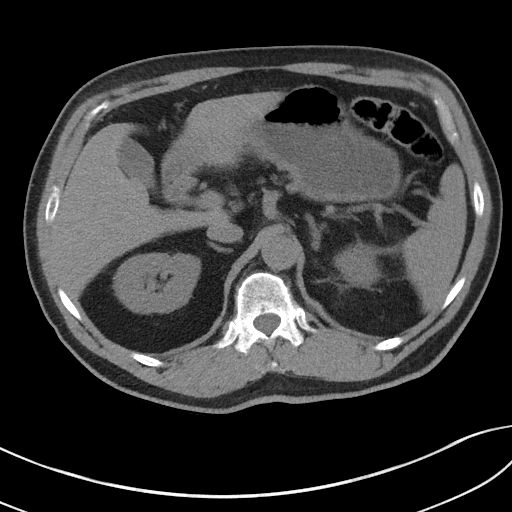
[im 72/89  soft-tissue]
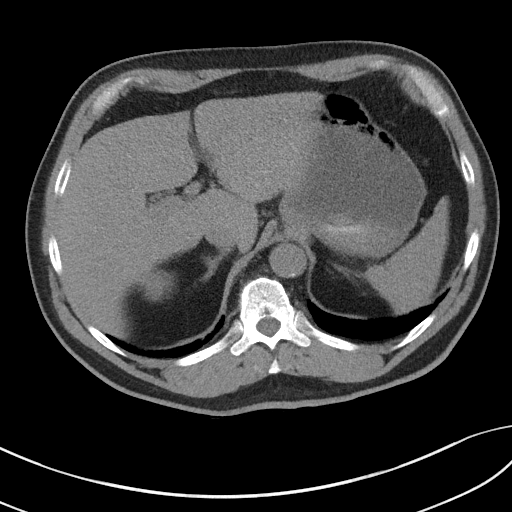
[im 78/89  soft-tissue]
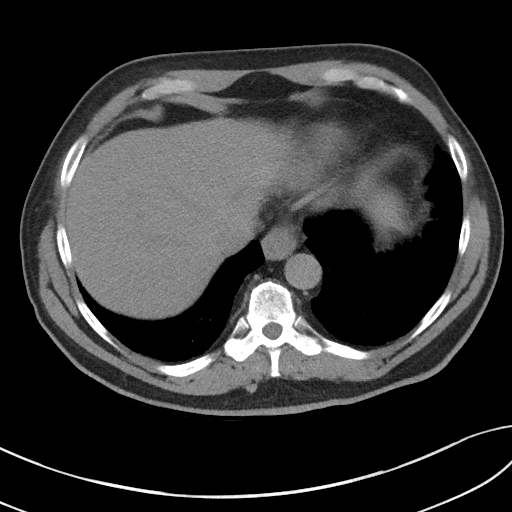
[im 83/89  soft-tissue]
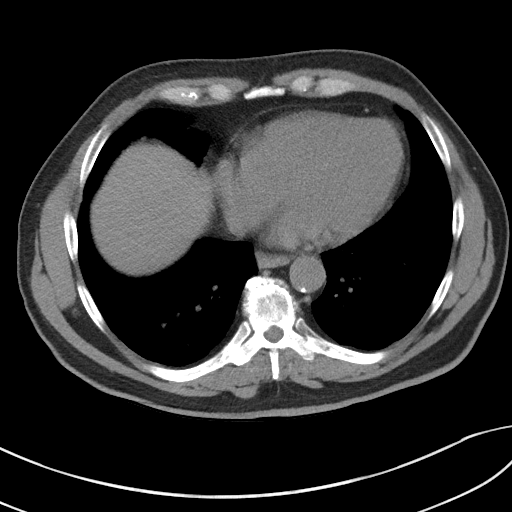

[Series 5: coronal st · coronal · 0.74mm/px · 3 of 82 slices shown]
[im 28/82  soft-tissue]
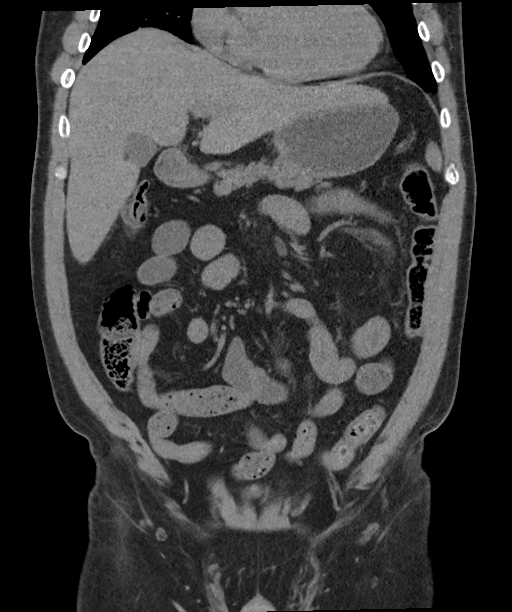
[im 37/82  soft-tissue]
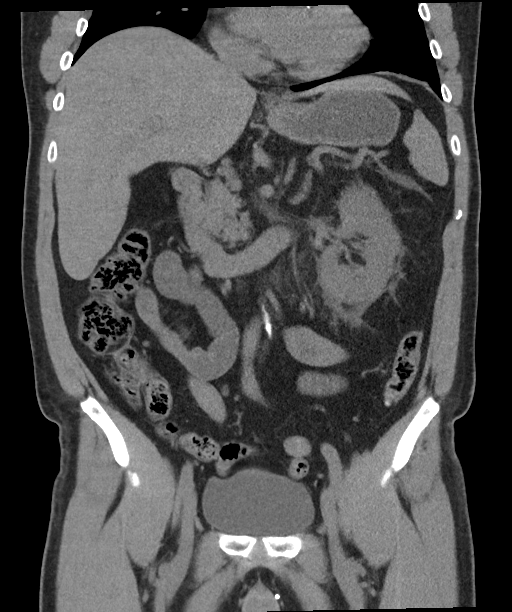
[im 46/82  soft-tissue]
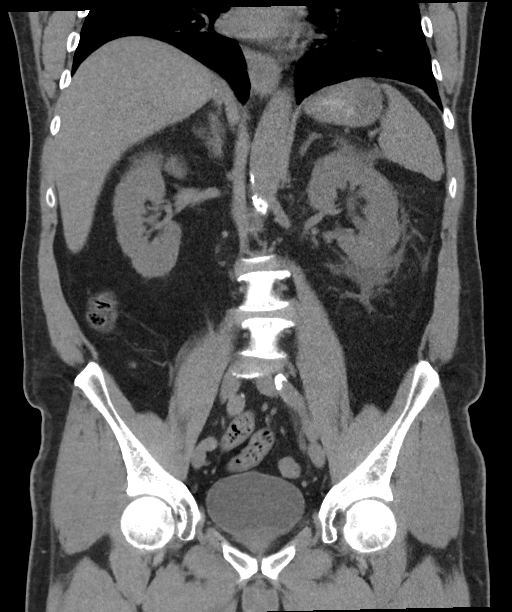

[17 of 46 positions shown; findings below may reference images not displayed]

FINDINGS: Lower chest: Mild atelectatic type density at the bases. Coronary
artery calcification.

Hepatobiliary: No focal liver abnormality.No evidence of biliary
obstruction or stone.

Pancreas: Unremarkable.

Spleen: Unremarkable.

Adrenals/Urinary Tract: Negative adrenals. Left
hydroureteronephrosis and perinephric stranding with a 3 mm stone at
the UVJ. Two punctate right renal calculi on reformats. Two punctate
left upper pole calculi. Unremarkable bladder.

Stomach/Bowel:  No obstruction. No visible bowel inflammation.

Vascular/Lymphatic: No acute vascular abnormality. Diffuse
atheromatous calcification of the aorta. No mass or adenopathy.

Reproductive:No pathologic findings.

Other: No ascites or pneumoperitoneum.

Musculoskeletal: No acute abnormalities. Generalized disc
degeneration.
IMPRESSION: 1. Obstructing 3 mm left UVJ calculus.
2. Punctate bilateral renal calculi.
3.  Aortic Atherosclerosis (TUCL1-V47.7).

## 2022-11-01 NOTE — Progress Notes (Signed)
[facility] [place] Phone: [phone] Subjective:   Alex [name], am serving as a scribe for Dr. Hulan Saas.  I'm seeing this patient by the request  of:  Marin Olp, MD  CC: Low back pain and foot pain  [id number]  Alex [name] is a 61 y.o. male coming in with complaint of LBP. Last seen in 2020 for R hip pain. Patient states low back pain in the right hip area, patient has also started having bilateral feet pain and was told by his chiropractor that they think it is plantar fascitis. Feet pain has been going on for several months, patient got insoles for his shoes that did help but now the pain is back full force, states the feet hurts worse than the back but his back will give out on him sometimes and it makes it hard to do anything.        Past Medical History:  Diagnosis Date   Allergy    flonase   Anxiety    OCC   Arthritis    COVID-19    Diabetes mellitus    diet controlled   GERD (gastroesophageal reflux disease)    past hx lost weight   Hemorrhoids    occ inflammation   Hx of adenomatous polyp of colon 04/28/2017   Mixed hyperlipidemia    Nonspecific elevation of levels of transaminase or lactic acid dehydrogenase (LDH)    Unspecified essential hypertension    Past Surgical History:  Procedure Laterality Date   APPENDECTOMY  1965   COLONOSCOPY     POLYPECTOMY     Social History   Socioeconomic History   Marital status: Married    Spouse name: Not on file   Number of children: Not on file   Years of education: Not on file   Highest education level: Not on file  Occupational History   Not on file  Tobacco Use   Smoking status: Never   Smokeless tobacco: Never  Vaping Use   Vaping Use: Never used  Substance and Sexual Activity   Alcohol use: Yes    Alcohol/week: 1.0 standard drink of alcohol    Types: 1 Standard drinks or equivalent per week    Comment: occ   Drug  use: No   Sexual activity: Not on file  Other Topics Concern   Not on file  Social History Narrative   Married 2007. No kids. 3 yorkies and maltese-yorkie mix   [place] work. 5 days a week now.    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), guns, target shooting, travel   Social Determinants of Radio broadcast assistant Strain: Not on file  Food Insecurity: Not on file  Transportation Needs: Not on file  Physical Activity: Not on file  Stress: Not on file  Social Connections: Not on file   Allergies  Allergen Reactions   Amlodipine     gingival hyperplasia    Family History  Problem Relation Age of Onset   COPD Mother    Emphysema Mother    Heart disease Father        heavy smoker, 88   Coronary artery disease Father    Heart attack Father    Colon cancer Neg Hx    Colon polyps Neg Hx  Esophageal cancer Neg Hx    Rectal cancer Neg Hx    Stomach cancer Neg Hx      Current Outpatient Medications (Cardiovascular):    EPINEPHrine 0.3 mg/0.3 mL IJ SOAJ injection, Inject 0.3 mLs (0.3 mg total) into the muscle as needed for anaphylaxis.   lisinopril (ZESTRIL) 40 MG tablet, TAKE 1 TABLET(40 MG) BY MOUTH DAILY   lovastatin (MEVACOR) 10 MG tablet, Take 1 tablet (10 mg total) by mouth once a week.  Current Outpatient Medications (Respiratory):    albuterol (VENTOLIN HFA) 108 (90 Base) MCG/ACT inhaler, Inhale 1-2 puffs into the lungs every 4 (four) hours as needed for wheezing or shortness of breath.   fluticasone (FLONASE) 50 MCG/ACT nasal spray, Place 2 sprays into both nostrils daily.  Current Outpatient Medications (Analgesics):    meloxicam (MOBIC) 15 MG tablet, Take 1 tablet (15 mg total) by mouth daily.  Current Outpatient Medications (Hematological):    vitamin B-12 (CYANOCOBALAMIN) 500 MCG tablet, Take 500 mcg by mouth daily.  Current  Outpatient Medications (Other):    clobetasol cream (TEMOVATE) 0.35 %, Apply 1 application topically 2 (two) times daily. Eczema for up to 7-10 days   Coenzyme Q10 150 MG CAPS, Take 150 capsules by mouth daily.   glucose blood (ACCU-CHEK AVIVA) test strip, Check blood sugar daily as needed.   glucose blood test strip, 1 each by Other route daily. Use as instructed   Lancet Devices (ACCU-CHEK SOFTCLIX) lancets, 1 each by Other route daily. Use as instructed   Multiple Vitamin (MULTIVITAMIN) tablet, Take 1 tablet by mouth daily.   Omega-3 Fatty Acids (RA FISH OIL) 1000 MG CAPS, Take by mouth.   Spacer/Aero-Holding Chambers (AEROCHAMBER PLUS) inhaler, Use with inhaler   tamsulosin (FLOMAX) 0.4 MG CAPS capsule, Take 1 capsule (0.4 mg total) by mouth daily after breakfast.   Reviewed prior external information including notes and imaging from  primary care provider As well as notes that were available from care everywhere and other healthcare systems.  Past medical history, social, surgical and family history all reviewed in electronic medical record.  No pertanent information unless stated regarding to the chief complaint.   Review of Systems:  No headache, visual changes, nausea, vomiting, diarrhea, constipation, dizziness, abdominal pain, skin rash, fevers, chills, night sweats, weight loss, swollen lymph nodes, body aches, joint swelling, chest pain, shortness of breath, mood changes. POSITIVE muscle aches  Objective  Blood pressure 138/86, pulse 77, height '5\' 6"'$  (1.676 m), weight 160 lb (72.6 kg), SpO2 94 %.   General: No apparent distress alert and oriented x3 mood and affect normal, dressed appropriately.  HEENT: Pupils equal, extraocular movements intact  Respiratory: Patient's speak in full sentences and does not appear short of breath  Cardiovascular: No lower extremity edema, non tender, no erythema  Low back does have some loss of lordosis.  Some limited extension of the back  noted.  Patient does have tenderness to palpation diffusely in the paraspinal musculature of the lumbar spine.  Negative straight leg test.  Foot exam does show the patient does have some breakdown of the longitudinal arch noted.  Tender to palpation over the medial calcaneal area.  Patient does have splaying between the first and second toes.  [id number]; 15 additional minutes spent for Therapeutic exercises as stated in above notes.  This included exercises focusing on stretching, strengthening, with significant focus on eccentric aspects.   Long term goals include an improvement in range of motion, strength, endurance as well as avoiding  reinjury. Patient's frequency would include in 1-2 times a day, 3-5 times a week for a duration of 6-12 weeks.  Low back exercises that included:  Pelvic tilt/bracing instruction to focus on control of the pelvic girdle and lower abdominal muscles  Glute strengthening exercises, focusing on proper firing of the glutes without engaging the low back muscles Proper stretching techniques for maximum relief for the hamstrings, hip flexors, low back and some rotation where tolerated Proper technique shown and discussed handout in great detail with ATC.  All questions were discussed and answered.      Impression and Recommendations:    The above documentation has been reviewed and is accurate and complete Lyndal Pulley, DO

## 2022-11-03 ENCOUNTER — Ambulatory Visit: Payer: 59 | Admitting: Family Medicine

## 2022-11-03 VITALS — BP 138/86 | HR 77 | Ht 66.0 in | Wt 160.0 lb

## 2022-11-03 DIAGNOSIS — M545 Low back pain, unspecified: Secondary | ICD-10-CM

## 2022-11-03 DIAGNOSIS — G8929 Other chronic pain: Secondary | ICD-10-CM

## 2022-11-03 DIAGNOSIS — M722 Plantar fascial fibromatosis: Secondary | ICD-10-CM

## 2022-11-03 MED ORDER — MELOXICAM 15 MG PO TABS: 15.0000 mg | ORAL_TABLET | Freq: Every day | ORAL | 0 refills | Status: DC

## 2022-11-03 NOTE — Patient Instructions (Addendum)
Good to see you  Meloxicam 15 mg  Piriformis HEP Ice and tennis ball in back packet  Follow up in 5-6

## 2022-11-04 ENCOUNTER — Encounter: Payer: Self-pay | Admitting: Family Medicine

## 2022-11-04 DIAGNOSIS — M722 Plantar fascial fibromatosis: Secondary | ICD-10-CM | POA: Insufficient documentation

## 2022-11-04 DIAGNOSIS — M545 Low back pain, unspecified: Secondary | ICD-10-CM | POA: Insufficient documentation

## 2022-11-04 NOTE — Assessment & Plan Note (Signed)
Likely patient has not had any treatment recently for this.  Has been seeing a chiropractor and not making great strides.  We have given him some exercises that I think will be beneficial.  Patient states that he did respond to over-the-counter anti-inflammatories but will start meloxicam 15 mg and warned of potential side effects.  Will get x-rays to further evaluate.  Depending on how the patient responds we will see if osteopathic manipulation could be potentially beneficial or the possibility of advanced imaging if any radicular symptoms or weakness occurs.  Follow-up again in 6 to 8 weeks

## 2022-11-04 NOTE — Assessment & Plan Note (Signed)
Seems to be bilateral.  I think the patient actually has more of breakdown of her shoes.  We discussed proper shoe choices, over-the-counter orthotics, icing regimen, home exercises.  Discussed which activities to do and which ones to avoid.  Follow-up again in 6 to 8 weeks.  Could consider custom orthotics if necessary.

## 2022-12-13 ENCOUNTER — Ambulatory Visit: Payer: 59 | Admitting: Family Medicine

## 2022-12-13 ENCOUNTER — Ambulatory Visit: Payer: Self-pay

## 2022-12-13 VITALS — BP 142/86 | HR 67 | Ht 66.0 in | Wt 161.0 lb

## 2022-12-13 DIAGNOSIS — M9904 Segmental and somatic dysfunction of sacral region: Secondary | ICD-10-CM | POA: Diagnosis not present

## 2022-12-13 DIAGNOSIS — M79672 Pain in left foot: Secondary | ICD-10-CM

## 2022-12-13 DIAGNOSIS — M79671 Pain in right foot: Secondary | ICD-10-CM | POA: Diagnosis not present

## 2022-12-13 DIAGNOSIS — M545 Low back pain, unspecified: Secondary | ICD-10-CM | POA: Diagnosis not present

## 2022-12-13 DIAGNOSIS — M9903 Segmental and somatic dysfunction of lumbar region: Secondary | ICD-10-CM | POA: Diagnosis not present

## 2022-12-13 DIAGNOSIS — G8929 Other chronic pain: Secondary | ICD-10-CM

## 2022-12-13 DIAGNOSIS — M9902 Segmental and somatic dysfunction of thoracic region: Secondary | ICD-10-CM

## 2022-12-13 MED ORDER — MELOXICAM 7.5 MG PO TABS: 7.5000 mg | ORAL_TABLET | Freq: Every day | ORAL | 0 refills | Status: DC

## 2022-12-13 NOTE — Progress Notes (Signed)
[place] Phone: [phone] Subjective:    I'm seeing this patient by the request  of:  Marin Olp, MD  CC: bilateral foot and low back pain   [id number]  11/03/2022 Seems to be bilateral.  I think the patient actually has more of breakdown of her shoes.  We discussed proper shoe choices, over-the-counter orthotics, icing regimen, home exercises.  Discussed which activities to do and which ones to avoid.  Follow-up again in 6 to 8 weeks.  Could consider custom orthotics if necessary.   Likely patient has not had any treatment recently for this.  Has been seeing a chiropractor and not making great strides.  We have given him some exercises that I think will be beneficial.  Patient states that he did respond to over-the-counter anti-inflammatories but will start meloxicam 15 mg and warned of potential side effects.  Will get x-rays to further evaluate.  Depending on how the patient responds we will see if osteopathic manipulation could be potentially beneficial or the possibility of advanced imaging if any radicular symptoms or weakness occurs.  Follow-up again in 6 to 8 weeks     Update 12/13/2022 Alex [name] is a 62 y.o. male coming in with complaint of B foot and LBP. Patient states that Meloxicam has been helpful to both his back and foot pain. Purchased new shoes and likes the Kim but not the [employer]. Feels 90% better.   Back pain has improved with meloxicam. Took medication for one week and within 3 days from discontinuing medication his pain will come back.     Past Medical History:  Diagnosis Date   Allergy    flonase   Anxiety    OCC   Arthritis    COVID-19    Diabetes mellitus    diet controlled   GERD (gastroesophageal reflux disease)    past hx lost weight   Hemorrhoids    occ inflammation   Hx of adenomatous polyp of colon 04/28/2017   Mixed hyperlipidemia    Nonspecific elevation  of levels of transaminase or lactic acid dehydrogenase (LDH)    Unspecified essential hypertension    Past Surgical History:  Procedure Laterality Date   APPENDECTOMY  1965   COLONOSCOPY     POLYPECTOMY     Social History   Socioeconomic History   Marital status: Married    Spouse name: Not on file   Number of children: Not on file   Years of education: Not on file   Highest education level: Not on file  Occupational History   Not on file  Tobacco Use   Smoking status: Never   Smokeless tobacco: Never  Vaping Use   Vaping Use: Never used  Substance and Sexual Activity   Alcohol use: Yes    Alcohol/week: 1.0 standard drink of alcohol    Types: 1 Standard drinks or equivalent per week    Comment: occ   Drug use: No   Sexual activity: Not on file  Other Topics Concern   Not on file  Social History Narrative   Married 2007. No kids. 3 yorkies and maltese-yorkie mix   [place] work. 5 days a week now.    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), guns, target shooting, travel   Social Determinants of  Health   Financial Resource Strain: Not on file  Food Insecurity: Not on file  Transportation Needs: Not on file  Physical Activity: Not on file  Stress: Not on file  Social Connections: Not on file   Allergies  Allergen Reactions   Amlodipine     gingival hyperplasia    Family History  Problem Relation Age of Onset   COPD Mother    Emphysema Mother    Heart disease Father        heavy smoker, 24   Coronary artery disease Father    Heart attack Father    Colon cancer Neg Hx    Colon polyps Neg Hx    Esophageal cancer Neg Hx    Rectal cancer Neg Hx    Stomach cancer Neg Hx      Current Outpatient Medications (Cardiovascular):    EPINEPHrine 0.3 mg/0.3 mL IJ SOAJ injection, Inject 0.3 mLs (0.3 mg total) into the muscle as needed for  anaphylaxis.   lisinopril (ZESTRIL) 40 MG tablet, TAKE 1 TABLET(40 MG) BY MOUTH DAILY   lovastatin (MEVACOR) 10 MG tablet, Take 1 tablet (10 mg total) by mouth once a week.  Current Outpatient Medications (Respiratory):    albuterol (VENTOLIN HFA) 108 (90 Base) MCG/ACT inhaler, Inhale 1-2 puffs into the lungs every 4 (four) hours as needed for wheezing or shortness of breath.   fluticasone (FLONASE) 50 MCG/ACT nasal spray, Place 2 sprays into both nostrils daily.  Current Outpatient Medications (Analgesics):    meloxicam (MOBIC) 7.5 MG tablet, Take 1 tablet (7.5 mg total) by mouth daily.  Current Outpatient Medications (Hematological):    vitamin B-12 (CYANOCOBALAMIN) 500 MCG tablet, Take 500 mcg by mouth daily.  Current Outpatient Medications (Other):    clobetasol cream (TEMOVATE) 2.77 %, Apply 1 application topically 2 (two) times daily. Eczema for up to 7-10 days   Coenzyme Q10 150 MG CAPS, Take 150 capsules by mouth daily.   glucose blood (ACCU-CHEK AVIVA) test strip, Check blood sugar daily as needed.   glucose blood test strip, 1 each by Other route daily. Use as instructed   Lancet Devices (ACCU-CHEK SOFTCLIX) lancets, 1 each by Other route daily. Use as instructed   Multiple Vitamin (MULTIVITAMIN) tablet, Take 1 tablet by mouth daily.   Omega-3 Fatty Acids (RA FISH OIL) 1000 MG CAPS, Take by mouth.   Spacer/Aero-Holding Chambers (AEROCHAMBER PLUS) inhaler, Use with inhaler   tamsulosin (FLOMAX) 0.4 MG CAPS capsule, Take 1 capsule (0.4 mg total) by mouth daily after breakfast.   Reviewed prior external information including notes and imaging from  primary care provider As well as notes that were available from care everywhere and other healthcare systems.  Past medical history, social, surgical and family history all reviewed in electronic medical record.  No pertanent information unless stated regarding to the chief complaint.   Review of Systems:  No headache, visual  changes, nausea, vomiting, diarrhea, constipation, dizziness, abdominal pain, skin rash, fevers, chills, night sweats, weight loss, swollen lymph nodes, body aches, joint swelling, chest pain, shortness of breath, mood changes. POSITIVE muscle aches  Objective  Blood pressure (!) 142/86, pulse 67, height '5\' 6"'$  (1.676 m), weight 161 lb (73 kg), SpO2 98 %.   General: No apparent distress alert and oriented x3 mood and affect normal, dressed appropriately.  HEENT: Pupils equal, extraocular movements intact  Respiratory: Patient's speak in full sentences and does not appear short of breath  Cardiovascular: No lower extremity edema, non tender, no erythema  Low back exam does have some mild loss of lordosis.  Tightness noted with FABER test bilaterally.  Does have some loss of lordosis and difficulty with full extension.   Osteopathic findings  T6 extended rotated and side bent right  L1 flexed rotated and side bent right Sacrum right on right    Impression and Recommendations:    The above documentation has been reviewed and is accurate and complete Lyndal Pulley, DO

## 2022-12-13 NOTE — Patient Instructions (Addendum)
Meloxicam 7.5 mg daily Started manipulation See me in 6-8 weeks

## 2022-12-14 DIAGNOSIS — M9903 Segmental and somatic dysfunction of lumbar region: Secondary | ICD-10-CM | POA: Insufficient documentation

## 2022-12-14 NOTE — Assessment & Plan Note (Addendum)
Decision today to treat with OMT was based on Physical Exam  After verbal consent patient was treated with HVLA, ME, FPR techniques in thoracic, , lumbar and sacral areas, all areas are chronic   Patient tolerated the procedure well with improvement in symptoms  Patient given exercises, stretches and lifestyle modifications  See medications in patient instructions if given  Patient will follow up in 4-8 weeks

## 2022-12-14 NOTE — Assessment & Plan Note (Signed)
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  We again discussed with patient different treatment options and patient elected to try osteopathic manipulation.  Discussed core strengthening.  Patient was getting significant benefit from the meloxicam and will do a 7.5 mg dose to see if he still gets improvement with less likely side effects.  Follow-up again in 6 to 8 weeks

## 2023-01-11 ENCOUNTER — Other Ambulatory Visit: Payer: Self-pay | Admitting: Family Medicine

## 2023-01-26 NOTE — Progress Notes (Signed)
[place] Phone: [phone] Subjective:   Alex Alex [name], am serving as a scribe for Dr. Hulan Saas.  I'm seeing this patient by the request  of:  Marin Olp, MD  CC: back and neck pain follow up   Alex Alex [name]  Alex Alex [name] is a 62 y.o. male coming in with complaint of back and neck pain. OMT on 12/13/2022. Patient states that he had an increase in his pain after manipulation. Unable to move for 2 days. Feels his pain is back to what it was initially. Patient was able to pick up items in his arms but his back will seize up now. Has not been taking meloxicam as it was not doing a lot for his pain.   Medications patient has been prescribed: Meloxicam  Taking:       Reviewed prior external information including notes and imaging from previsou exam, outside providers and external EMR if available.   As well as notes that were available from care everywhere and other healthcare systems.  Past medical history, social, surgical and family history all reviewed in electronic medical record.  Alex [name] pertanent information unless stated regarding to the chief complaint.   Past Medical History:  Diagnosis Date   Allergy    flonase   Anxiety    OCC   Arthritis    COVID-19    Diabetes mellitus    diet controlled   GERD (gastroesophageal reflux disease)    past hx lost weight   Hemorrhoids    occ inflammation   Hx of adenomatous polyp of colon 04/28/2017   Mixed hyperlipidemia    Nonspecific elevation of levels of transaminase or lactic acid dehydrogenase (LDH)    Unspecified essential hypertension     Allergies  Allergen Reactions   Amlodipine     gingival hyperplasia      Review of Systems:  Alex [name] headache, visual changes, nausea, vomiting, diarrhea, constipation, dizziness, abdominal pain, skin rash, fevers, chills, night sweats, weight loss, swollen lymph nodes, body aches, joint swelling,  chest pain, shortness of breath, mood changes. POSITIVE muscle aches  Objective  Blood pressure 124/80, pulse 85, height '5\' 6"'$  (1.676 m), weight 163 lb (73.9 kg), SpO2 94 %.   General: Alex [name] apparent distress alert and oriented x3 mood and affect normal, dressed appropriately.  HEENT: Pupils equal, extraocular movements intact  Respiratory: Patient's speak in full sentences and does not appear short of breath  Cardiovascular: Alex [name] lower extremity edema, non tender, Alex [name] erythema  Low back exam loss of lordosis tightness mostly in the thoracolumbar juncture.  Patient does have mild tightness with straight leg test noted.  Osteopathic findings  C3 flexed rotated and side bent right T3 extended rotated and side bent right inhaled rib T6 extended rotated and side bent left L2 flexed rotated and side bent right L4 flexed rotated and side bent left Sacrum right on right     Assessment and Plan:  Low back pain Patient was feeling significantly better initially but now having worsening pain again.  He may be having a flare from last manipulation.  Discussed with patient about icing regimen and home exercises.  Discussed hip abductors as well.  Follow-up again in 6 to 8 weeks.  Alex [name] other changes in medication follow-up again as stated in 6 to 8 weeks    Nonallopathic problems  Decision today to treat with OMT was based on Physical Exam  After verbal consent patient  was treated with HVLA, ME, FPR techniques in cervical, rib, thoracic, lumbar, and sacral  areas  Patient tolerated the procedure well with improvement in symptoms  Patient given exercises, stretches and lifestyle modifications  See medications in patient instructions if given  Patient will follow up in 4-8 weeks    The above documentation has been reviewed and is accurate and complete Lyndal Pulley, DO          Note: This dictation was prepared with Dragon dictation along with smaller phrase technology. Any  transcriptional errors that result from this process are unintentional.

## 2023-01-31 ENCOUNTER — Ambulatory Visit: Payer: 59 | Admitting: Family Medicine

## 2023-01-31 VITALS — BP 124/80 | HR 85 | Ht 66.0 in | Wt 163.0 lb

## 2023-01-31 DIAGNOSIS — M9902 Segmental and somatic dysfunction of thoracic region: Secondary | ICD-10-CM

## 2023-01-31 DIAGNOSIS — M9904 Segmental and somatic dysfunction of sacral region: Secondary | ICD-10-CM | POA: Diagnosis not present

## 2023-01-31 DIAGNOSIS — M545 Low back pain, unspecified: Secondary | ICD-10-CM

## 2023-01-31 DIAGNOSIS — M9901 Segmental and somatic dysfunction of cervical region: Secondary | ICD-10-CM | POA: Diagnosis not present

## 2023-01-31 DIAGNOSIS — M9903 Segmental and somatic dysfunction of lumbar region: Secondary | ICD-10-CM

## 2023-01-31 DIAGNOSIS — M9908 Segmental and somatic dysfunction of rib cage: Secondary | ICD-10-CM

## 2023-01-31 DIAGNOSIS — G8929 Other chronic pain: Secondary | ICD-10-CM

## 2023-01-31 NOTE — Patient Instructions (Signed)
Great to see you as always  Sorry for the flare  I think we are making progress though Tried muscle energy and hope it will work well  See me again in 6 weeks

## 2023-01-31 NOTE — Assessment & Plan Note (Signed)
Patient was feeling significantly better initially but now having worsening pain again.  He may be having a flare from last manipulation.  Discussed with patient about icing regimen and home exercises.  Discussed hip abductors as well.  Follow-up again in 6 to 8 weeks.  No other changes in medication follow-up again as stated in 6 to 8 weeks

## 2023-02-06 ENCOUNTER — Other Ambulatory Visit: Payer: Self-pay | Admitting: Family Medicine

## 2023-03-05 ENCOUNTER — Other Ambulatory Visit: Payer: Self-pay | Admitting: Family Medicine

## 2023-03-15 NOTE — Progress Notes (Unsigned)
Alex [name] [address] [place] [address] Phone: [phone] Subjective:   Alex [name], am serving as a scribe for Dr. Antoine Primas.  I'm seeing this patient by the request  of:  Shelva Majestic, MD  CC: back and neck pain   [id number]  Alex [name] is a 62 y.o. male coming in with complaint of back and neck pain. OMT 01/31/2023. Patient states doing well. Pain is manageable. No new concerns.  Medications patient has been prescribed: Meloxicam  Taking:         Reviewed prior external information including notes and imaging from previsou exam, outside providers and external EMR if available.   As well as notes that were available from care everywhere and other healthcare systems.  Past medical history, social, surgical and family history all reviewed in electronic medical record.  No pertanent information unless stated regarding to the chief complaint.   Past Medical History:  Diagnosis Date   Allergy    flonase   Anxiety    OCC   Arthritis    COVID-19    Diabetes mellitus    diet controlled   GERD (gastroesophageal reflux disease)    past hx lost weight   Hemorrhoids    occ inflammation   Hx of adenomatous polyp of colon 04/28/2017   Mixed hyperlipidemia    Nonspecific elevation of levels of transaminase or lactic acid dehydrogenase (LDH)    Unspecified essential hypertension     Allergies  Allergen Reactions   Amlodipine     gingival hyperplasia      Review of Systems:  No headache, visual changes, nausea, vomiting, diarrhea, constipation, dizziness, abdominal pain, skin rash, fevers, chills, night sweats, weight loss, swollen lymph nodes, body aches, joint swelling, chest pain, shortness of breath, mood changes. POSITIVE muscle aches  Objective  Blood pressure 112/86, pulse (!) 59, height  (1.676 m), weight 157 lb (71.2 kg), SpO2 97 %.   General: No apparent distress alert and oriented x3 mood and  affect normal, dressed appropriately.  HEENT: Pupils equal, extraocular movements intact  Respiratory: Patient's speak in full sentences and does not appear short of breath  Cardiovascular: No lower extremity edema, non tender, no erythema  Neck exam does have some loss of lordosis  Patient does have some tightness noted in the low back.  Seems to be more muscular than anything else at this time.  Osteopathic findings  C3 flexed rotated and side bent right C6 flexed rotated and side bent left T6 extended rotated and side bent left L1 flexed rotated and side bent right Sacrum right on right       Assessment and Plan:  Low back pain Low back doing well, mild tightness of the neck patient continues to be active otherwise.  Discussed which activities to do and which ones to avoid.  Increase activity slowly.  Follow-up again in 2 to 3 months    Nonallopathic problems  Decision today to treat with OMT was based on Physical Exam  After verbal consent patient was treated with HVLA, ME, FPR techniques in cervical,  thoracic, lumbar, and sacral  areas  Patient tolerated the procedure well with improvement in symptoms  Patient given exercises, stretches and lifestyle modifications  See medications in patient instructions if given  Patient will follow up in 4-8 weeks     The above documentation has been reviewed and is accurate and complete Judi Saa, DO  Note: This dictation was prepared with Dragon dictation along with smaller phrase technology. Any transcriptional errors that result from this process are unintentional.

## 2023-03-16 ENCOUNTER — Ambulatory Visit: Payer: 59 | Admitting: Family Medicine

## 2023-03-16 VITALS — BP 112/86 | HR 59 | Ht 66.0 in | Wt 157.0 lb

## 2023-03-16 DIAGNOSIS — G8929 Other chronic pain: Secondary | ICD-10-CM

## 2023-03-16 DIAGNOSIS — M9903 Segmental and somatic dysfunction of lumbar region: Secondary | ICD-10-CM

## 2023-03-16 DIAGNOSIS — M9904 Segmental and somatic dysfunction of sacral region: Secondary | ICD-10-CM

## 2023-03-16 DIAGNOSIS — M9901 Segmental and somatic dysfunction of cervical region: Secondary | ICD-10-CM

## 2023-03-16 DIAGNOSIS — M9902 Segmental and somatic dysfunction of thoracic region: Secondary | ICD-10-CM | POA: Diagnosis not present

## 2023-03-16 DIAGNOSIS — M545 Low back pain, unspecified: Secondary | ICD-10-CM | POA: Diagnosis not present

## 2023-03-16 NOTE — Patient Instructions (Signed)
See me again in 8 weeks No big changes

## 2023-03-16 NOTE — Assessment & Plan Note (Addendum)
Low back doing well, mild tightness of the neck patient continues to be active otherwise.  Discussed which activities to do and which ones to avoid.  Increase activity slowly.  Follow-up again in 2 to 3 months

## 2023-04-01 ENCOUNTER — Other Ambulatory Visit: Payer: Self-pay | Admitting: Family Medicine

## 2023-04-19 ENCOUNTER — Other Ambulatory Visit: Payer: Self-pay

## 2023-04-19 MED ORDER — LISINOPRIL 40 MG PO TABS: ORAL_TABLET | ORAL | 2 refills | Status: DC

## 2023-04-21 ENCOUNTER — Other Ambulatory Visit: Payer: Self-pay

## 2023-04-21 ENCOUNTER — Encounter: Payer: Self-pay | Admitting: Family Medicine

## 2023-04-21 MED ORDER — LISINOPRIL 40 MG PO TABS: ORAL_TABLET | ORAL | 2 refills | Status: DC

## 2023-05-03 ENCOUNTER — Encounter: Payer: Self-pay | Admitting: Family Medicine

## 2023-05-03 ENCOUNTER — Ambulatory Visit (INDEPENDENT_AMBULATORY_CARE_PROVIDER_SITE_OTHER): Payer: 59 | Admitting: Family Medicine

## 2023-05-03 VITALS — BP 157/88 | HR 57 | Temp 97.2°F | Ht 66.0 in | Wt 163.6 lb

## 2023-05-03 DIAGNOSIS — E1169 Type 2 diabetes mellitus with other specified complication: Secondary | ICD-10-CM

## 2023-05-03 DIAGNOSIS — Z0001 Encounter for general adult medical examination with abnormal findings: Secondary | ICD-10-CM | POA: Diagnosis not present

## 2023-05-03 DIAGNOSIS — E785 Hyperlipidemia, unspecified: Secondary | ICD-10-CM | POA: Diagnosis not present

## 2023-05-03 DIAGNOSIS — R0789 Other chest pain: Secondary | ICD-10-CM | POA: Diagnosis not present

## 2023-05-03 DIAGNOSIS — G720 Drug-induced myopathy: Secondary | ICD-10-CM

## 2023-05-03 DIAGNOSIS — R079 Chest pain, unspecified: Secondary | ICD-10-CM

## 2023-05-03 DIAGNOSIS — Z125 Encounter for screening for malignant neoplasm of prostate: Secondary | ICD-10-CM | POA: Diagnosis not present

## 2023-05-03 DIAGNOSIS — I10 Essential (primary) hypertension: Secondary | ICD-10-CM

## 2023-05-03 DIAGNOSIS — I700 Atherosclerosis of aorta: Secondary | ICD-10-CM

## 2023-05-03 DIAGNOSIS — E119 Type 2 diabetes mellitus without complications: Secondary | ICD-10-CM

## 2023-05-03 LAB — LDL CHOLESTEROL, DIRECT: Direct LDL: 154 mg/dL

## 2023-05-03 LAB — CBC WITH DIFFERENTIAL/PLATELET
Basophils Absolute: 0 10*3/uL (ref 0.0–0.1)
Basophils Relative: 0.5 % (ref 0.0–3.0)
Eosinophils Absolute: 0.2 10*3/uL (ref 0.0–0.7)
Eosinophils Relative: 2.5 % (ref 0.0–5.0)
HCT: 43.5 % (ref 39.0–52.0)
Hemoglobin: 14.5 g/dL (ref 13.0–17.0)
Lymphocytes Relative: 25 % (ref 12.0–46.0)
Lymphs Abs: 1.8 10*3/uL (ref 0.7–4.0)
MCHC: 33.3 g/dL (ref 30.0–36.0)
MCV: 88.2 fl (ref 78.0–100.0)
Monocytes Absolute: 0.6 10*3/uL (ref 0.1–1.0)
Monocytes Relative: 8.7 % (ref 3.0–12.0)
Neutro Abs: 4.5 10*3/uL (ref 1.4–7.7)
Neutrophils Relative %: 63.3 % (ref 43.0–77.0)
Platelets: 280 10*3/uL (ref 150.0–400.0)
RBC: 4.93 Mil/uL (ref 4.22–5.81)
RDW: 13.7 % (ref 11.5–15.5)
WBC: 7.1 10*3/uL (ref 4.0–10.5)

## 2023-05-03 LAB — COMPREHENSIVE METABOLIC PANEL
ALT: 29 U/L (ref 0–53)
AST: 29 U/L (ref 0–37)
Albumin: 4.7 g/dL (ref 3.5–5.2)
Alkaline Phosphatase: 67 U/L (ref 39–117)
BUN: 17 mg/dL (ref 6–23)
CO2: 24 mEq/L (ref 19–32)
Calcium: 10 mg/dL (ref 8.4–10.5)
Chloride: 101 mEq/L (ref 96–112)
Creatinine, Ser: 0.89 mg/dL (ref 0.40–1.50)
GFR: 90.81 mL/min (ref >60.00)
Glucose, Bld: 100 mg/dL — ABNORMAL HIGH (ref 70–99)
Potassium: 4.3 mEq/L (ref 3.5–5.1)
Sodium: 138 mEq/L (ref 135–145)
Total Bilirubin: 0.8 mg/dL (ref 0.2–1.2)
Total Protein: 7.6 g/dL (ref 6.0–8.3)

## 2023-05-03 LAB — URINALYSIS, ROUTINE W REFLEX MICROSCOPIC
Bilirubin Urine: NEGATIVE
Hgb urine dipstick: NEGATIVE
Ketones, ur: NEGATIVE
Leukocytes,Ua: NEGATIVE
Nitrite: NEGATIVE
RBC / HPF: NONE SEEN (ref 0–?)
Specific Gravity, Urine: 1.01 (ref 1.000–1.030)
Total Protein, Urine: NEGATIVE
Urine Glucose: NEGATIVE
Urobilinogen, UA: 0.2 (ref 0.0–1.0)
pH: 6 (ref 5.0–8.0)

## 2023-05-03 LAB — PSA: PSA: 2.02 ng/mL (ref 0.10–4.00)

## 2023-05-03 LAB — LIPID PANEL
Cholesterol: 231 mg/dL — ABNORMAL HIGH (ref 0–200)
HDL: 38.8 mg/dL — ABNORMAL LOW (ref >39.00)
NonHDL: 191.72
Total CHOL/HDL Ratio: 6
Triglycerides: 220 mg/dL — ABNORMAL HIGH (ref 0.0–149.0)
VLDL: 44 mg/dL — ABNORMAL HIGH (ref 0.0–40.0)

## 2023-05-03 LAB — MICROALBUMIN / CREATININE URINE RATIO
Creatinine,U: 54.1 mg/dL
Microalb Creat Ratio: 1.3 mg/g (ref 0.0–30.0)
Microalb, Ur: <0.7 mg/dL (ref 0.0–1.9)

## 2023-05-03 LAB — HEMOGLOBIN A1C: Hgb A1c MFr Bld: 6 % (ref 4.6–6.5)

## 2023-05-03 LAB — TSH: TSH: 2.45 u[IU]/mL (ref 0.35–5.50)

## 2023-05-03 MED ORDER — TAMSULOSIN HCL 0.4 MG PO CAPS: 0.4000 mg | ORAL_CAPSULE | Freq: Every day | ORAL | 3 refills | Status: DC

## 2023-05-03 MED ORDER — HYDROCHLOROTHIAZIDE 12.5 MG PO TABS
12.5000 mg | ORAL_TABLET | Freq: Every day | ORAL | 5 refills | Status: DC
Start: 2023-05-03 — End: 2023-10-09

## 2023-05-03 MED ORDER — LOVASTATIN 10 MG PO TABS: 10.0000 mg | ORAL_TABLET | ORAL | 3 refills | Status: DC

## 2023-05-03 NOTE — Addendum Note (Signed)
Addended by: Shelva Majestic on: 05/03/2023 02:26 PM   Modules accepted: Level of Service

## 2023-05-03 NOTE — Assessment & Plan Note (Signed)
S: Compliant with lisinopril 40 mg BP Readings from Last 3 Encounters:  05/03/23 (!) 157/88  03/16/23 112/86  01/31/23 124/80  A/P:  blood pressure above goal but was controlled at last visit with Dr. Katrinka Blazing other than diastolic slightly high- he reports it was doing well at home until started feeling poorly with chest discomfort- see above- we discussed chance blood pressure itself could be contributing to discomfort- we opted to trial hydrochlorothiazide 12.5 mg daily and he will update me in a week or two with home readings

## 2023-05-03 NOTE — Patient Instructions (Addendum)
Please stop by lab before you go If you have mychart- we will send your results within 3 business days of [place] receiving them.  If you do not have mychart- we will call you about results within 5 business days of [place] receiving them.  *please also note that you will see labs on mychart as soon as they post. I will later go in and write notes on them- will say "notes from Dr. Durene Cal"   blood pressure above goal but was controlled at last visit with Dr. Katrinka Blazing other than diastolic slightly high- he reports it was doing well at home until started feeling poorly with chest discomfort- see above- we discussed chance blood pressure itself could be contributing to discomfort- we opted to trial hydrochlorothiazide 12.5 mg daily and he will update me in a week or two with home readings  with chest discomfort in 62 year old gentleman with diabetes and not fully controlled cholesterol although there are not typical features- we opted to refer to cardiology to be on the safe/cautious side- if he has worsening symptoms or new symptoms like shortness of breath should seek care but glad symptoms have almost fully resolved . Plus check bloodwork  We have placed a referral for you today to cardiology. you will see # listed below- you can call this if you have not heard within a week. Reach out to [place] if you ar enot scheduled within 2 weeks  Recommended follow up: Return in about 6 months (around 11/02/2023) for followup or sooner if needed.Schedule b4 you leave. As long as a1c is controlled -seek care for new symptoms or worsening pain

## 2023-05-03 NOTE — Assessment & Plan Note (Signed)
#   Dull ache/discomfort in chest  S:patient has noted a dull ache starting Saturday at home- was pretty noticeable as far as consistency and up to 2-3/10. Was not exercising when started- was just doing light work around him. No shortness of breath or left neck pain. Perhaps mild left shoulder pain. He was able to still be active like pull in heavy trashcan down long driveway without any worsening. Does not worsen with activity.  More left upper chest and into shoulder- no central chest discomfort. No fever, chills, cough. Has gradually felt better since Saturday- very minimal discomfort less than 1/10 today.  A/P: with chest discomfort in 62 year old gentleman with diabetes and not fully controlled cholesterol although there are not typical features- we opted to refer to cardiology to be on the safe/cautious side- if he has worsening symptoms or new symptoms like shortness of breath should seek care but glad symptoms have almost fully resolved . Plus check bloodwork

## 2023-05-03 NOTE — Progress Notes (Signed)
Phone: [phone]   Subjective:  Patient presents today for their annual physical. Chief complaint-noted.   See problem oriented charting- ROS- full  review of systems was completed and negative  except for: back pain, dull ache in chest (see below)  The following were reviewed and entered/updated in epic: Past Medical History:  Diagnosis Date   Allergy    flonase   Anxiety    OCC   Arthritis    COVID-19    Diabetes mellitus    diet controlled   GERD (gastroesophageal reflux disease)    past hx lost weight   Hemorrhoids    occ inflammation   Hx of adenomatous polyp of colon 04/28/2017   Mixed hyperlipidemia    Nonspecific elevation of levels of transaminase or lactic acid dehydrogenase (LDH)    Unspecified essential hypertension    Patient Active Problem List   Diagnosis Date Noted   Diabetes mellitus type II, controlled (HCC) 11/11/2010    Priority: High   BPH associated with nocturia 09/14/2021    Priority: Medium    Atherosclerosis of aorta (HCC) 10/15/2020    Priority: Medium    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    Priority: Medium    Hx of adenomatous polyp of colon 04/28/2017    Priority: Medium    Hyperlipidemia associated with type 2 diabetes mellitus (HCC) 12/17/2007    Priority: Medium    Essential hypertension 12/17/2007    Priority: Medium    Piriformis syndrome of right side 08/19/2019    Priority: Low   Bee sting allergy 07/04/2019    Priority: Low   Situational anxiety 07/22/2010    Priority: Low   Somatic dysfunction of spine, lumbar 12/14/2022   Low back pain 11/04/2022   Plantar fasciitis, bilateral 11/04/2022   Past Surgical History:  Procedure Laterality Date   APPENDECTOMY  1965   COLONOSCOPY     POLYPECTOMY      Family History  Problem Relation Age of Onset   COPD Mother    Emphysema Mother    Heart disease Father        heavy smoker, 50   Coronary artery disease Father    Heart attack Father    Colon cancer Neg Hx     Colon polyps Neg Hx    Esophageal cancer Neg Hx    Rectal cancer Neg Hx    Stomach cancer Neg Hx     Medications- reviewed and updated Current Outpatient Medications  Medication Sig Dispense Refill   albuterol (VENTOLIN HFA) 108 (90 Base) MCG/ACT inhaler Inhale 1-2 puffs into the lungs every 4 (four) hours as needed for wheezing or shortness of breath. 1 each 0   clobetasol cream (TEMOVATE) 0.05 % Apply 1 application topically 2 (two) times daily. Eczema for up to 7-10 days 60 g 2   Coenzyme Q10 150 MG CAPS Take 150 capsules by mouth daily.     EPINEPHrine 0.3 mg/0.3 mL IJ SOAJ injection Inject 0.3 mLs (0.3 mg total) into the muscle as needed for anaphylaxis. 2 each 1   fluticasone (FLONASE) 50 MCG/ACT nasal spray Place 2 sprays into both nostrils daily. 48 g 3   glucose blood (ACCU-CHEK AVIVA) test strip Check blood sugar daily as needed. 100 each 3   glucose blood test strip 1 each by Other route daily. Use as instructed 100 each 12   hydrochlorothiazide (HYDRODIURIL) 12.5 MG tablet Take 1 tablet (12.5 mg total) by mouth daily. 30 tablet 5   Lancet Devices (ACCU-CHEK SOFTCLIX) lancets  1 each by Other route daily. Use as instructed 1 each 11   lisinopril (ZESTRIL) 40 MG tablet TAKE 1 TABLET(40 MG) BY MOUTH DAILY 90 tablet 2   meloxicam (MOBIC) 7.5 MG tablet TAKE 1 TABLET(7.5 MG) BY MOUTH DAILY 30 tablet 0   Multiple Vitamin (MULTIVITAMIN) tablet Take 1 tablet by mouth daily.     Omega-3 Fatty Acids (RA FISH OIL) 1000 MG CAPS Take by mouth.     Spacer/Aero-Holding Chambers (AEROCHAMBER PLUS) inhaler Use with inhaler 1 each 2   vitamin B-12 (CYANOCOBALAMIN) 500 MCG tablet Take 500 mcg by mouth daily.     lovastatin (MEVACOR) 10 MG tablet Take 1 tablet (10 mg total) by mouth once a week. 13 tablet 3   tamsulosin (FLOMAX) 0.4 MG CAPS capsule Take 1 capsule (0.4 mg total) by mouth daily after breakfast. 90 capsule 3   No current facility-administered medications for this visit.     Allergies-reviewed and updated Allergies  Allergen Reactions   Amlodipine     gingival hyperplasia     Social History   Social History Narrative   Married 2007. No kids. 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 Oropharynx normal Neck: no thyromegaly CV: RRR no murmurs rubs or gallops. No chest wall pain  Lungs: CTAB no crackles, wheeze, rhonchi Abdomen: soft/nontender/nondistended/normal bowel sounds. No rebound or guarding.  Ext: no edema Skin: warm, dry Neuro: grossly normal, moves all extremities, PERRLA  EKG: sinus bradycardia rhythm with rate 56, left axis, normal intervals, no hypertrophy, no st or t wave changes but does have rsr in v1 noted. No prior for comparison  Diabetic Foot Exam - Simple   Simple Foot Form Diabetic Foot exam was performed with the following findings: Yes 05/03/2023  8:27 AM  Visual Inspection No deformities, no ulcerations, no other skin breakdown bilaterally: Yes Sensation Testing Intact to touch and monofilament testing bilaterally: Yes Pulse Check Posterior Tibialis and Dorsalis pulse intact bilaterally: Yes Comments      Assessment and Plan  62 y.o. male presenting for annual physical.  Health Maintenance counseling: 1. Anticipatory guidance: Patient counseled regarding regular dental exams -q3 months, eye exams - we requested copy of yearly visit today,  avoiding smoking and second hand smoke , limiting  alcohol to 2 beverages per day - 1 a week in general or less- on vacation maybe 1-2 a day, no illicit drugs .   2. Risk factor reduction:  Advised patient of need for regular exercise and diet rich and fruits and vegetables to reduce risk of heart attack and stroke.  Exercise- on vacation exercise has not existed and really even this year- wants to get back on this at least 20-30 minutes a day. Has elliptical, treadmill, weights, sit up bench at home Diet/weight management-weight up 7 lbs- see social update below. Marland Kitchen  Wt Readings from Last 3 Encounters:  05/03/23 163 lb 9.6 oz (74.2 kg)  03/16/23 157 lb (71.2 kg)  01/31/23 163 lb (73.9 kg)  3. Immunizations/screenings/ancillary studies- opts out of COVID shots long term Immunization History  Administered Date(s) Administered   Influenza,inj,Quad PF,6+ Mos 11/01/2013, 01/01/2019, 09/25/2020, 09/14/2021, 10/03/2022   Moderna Sars-Covid-2 Vaccination 02/06/2020, 03/07/2020   Pneumococcal Conjugate-13 11/01/2013   Pneumococcal Polysaccharide-23 08/01/2018   Td 11/28/2006   Tdap 03/03/2017   Zoster Recombinat (Shingrix) 01/01/2019, 04/04/2019  4. Prostate cancer screening-  trend with labs- low risk in past Lab Results  Component Value Date   PSA 1.44 03/31/2022   PSA 1.40 03/26/2021   PSA 1.46 01/06/2020   5. Colon cancer screening - history of colon polyp - with Dr. Gessner-07/04/22 - Diminutive adenoma recall 7 years 2030 6. Skin cancer screening-  saw derm - they continued steroid cream for rash/possible eczema - still on those.  Has also done full skin exam in past- plans to schedule. advised regular sunscreen use. Denies worrisome, changing, or new skin lesions.  7. Smoking associated screening (lung cancer screening, AAA screen 65-75, UA)- never smoker 8. STD screening -not needed as only active with wife and no concern for infidelity   Status of chronic or acute concerns   #social update- he retired April 5th . Was eating out more then  did a month vacation and ate out even more- feels this is why weight is up- plans to reverse this trend -does have new puppy that is affecting his sleep  # Dull ache/discomfort in chest  See separate note  #Diabetes mellitus-with hypertension and hyperlipidemia S: Medication: none-still  diet and exercise controlled. Lab Results  Component Value Date   HGBA1C 5.5 10/03/2022   HGBA1C 5.8 03/31/2022   HGBA1C 6.0 09/14/2021  A/P: hopefully stable- update a1c today- some concern for dietary changes may have crept up. Continue without meds for now    #Hyperlipidemia associated with diabetes #aortic atherosclerosis #statin myopathy S: medication: Patient willing to trial lovastatin 10 mg once a week -Patient has been intolerant to statin due to myalgias in the past even atorvastatin 40 mg once a week.   Lab Results  Component Value Date   CHOL 198 03/31/2022   HDL 40.70 03/31/2022   LDLCALC 137 (H) 03/31/2022   LDLDIRECT 143.0 09/14/2021   TRIG 102.0 03/31/2022   CHOLHDL 5 03/31/2022  A/P: imperfect control but max tolerable dose- continue current medications and update lipids    #Hypertension - see separate note  # BPH with nocturia S:medication: tamsulosin 0.4 mg- is helpful when taking- has been out lately and wants to restart  A/P: mild poor control- restart tamsulosin   # MUSCULOSKELETAL- continues close follow up with sports medicine for back issues with Dr. Katrinka Blazing- had one visit that didn't go as well but other visits did better -ended up with orthotics  Recommended follow up: Return in about 6 months (around 11/02/2023) for followup or sooner if needed.Schedule b4 you leave. Future Appointments  Date Time Provider Department Center  05/11/2023  8:45 AM Judi Saa, DO [facility] None   Lab/Order associations: fasting   ICD-10-CM   1. Encounter for general adult medical examination with abnormal findings  Z00.01     2. Chest pain, unspecified type  R07.9 EKG 12-Lead     3. Controlled type 2 diabetes mellitus without complication, without long-term current use of insulin (HCC)  E11.9 Hemoglobin A1c    Urine Microalbumin w/creat. ratio  Comprehensive metabolic panel    CBC with Differential/Platelet    Lipid panel    4. Hyperlipidemia associated with type 2 diabetes mellitus (HCC)  E11.69 Comprehensive metabolic panel   Z61.0 CBC with Differential/Platelet    Lipid panel    TSH    5. Essential hypertension  I10 Urinalysis, Routine w reflex microscopic    6. Atherosclerosis of aorta (HCC)  I70.0     7. Drug-induced myopathy  G72.0     8. Screening for prostate cancer  Z12.5 PSA    9. Atypical chest pain  R07.89 Ambulatory referral to Cardiology      Meds ordered this encounter  Medications   lovastatin (MEVACOR) 10 MG tablet    Sig: Take 1 tablet (10 mg total) by mouth once a week.    Dispense:  13 tablet    Refill:  3   tamsulosin (FLOMAX) 0.4 MG CAPS capsule    Sig: Take 1 capsule (0.4 mg total) by mouth daily after breakfast.    Dispense:  90 capsule    Refill:  3   hydrochlorothiazide (HYDRODIURIL) 12.5 MG tablet    Sig: Take 1 tablet (12.5 mg total) by mouth daily.    Dispense:  30 tablet    Refill:  5    Return precautions advised.  Tana Conch, MD

## 2023-05-03 NOTE — Progress Notes (Signed)
Phone [phone] In person visit   Subjective:   Alex [name] is a 62 y.o. 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   Low back pain 11/04/2022    Priority: 1.   Plantar fasciitis, bilateral 11/04/2022    Priority: 1.   Piriformis syndrome of right side 08/19/2019    Priority: 1.   Atypical chest pain 05/03/2023    Medications- reviewed and updated Current Outpatient Medications  Medication Sig Dispense Refill   albuterol (VENTOLIN HFA) 108 (90 Base) MCG/ACT inhaler Inhale 1-2 puffs into the lungs every 4 (four) hours as needed for wheezing or shortness of breath. 1 each 0   clobetasol cream (TEMOVATE) 0.05 % Apply 1 application topically 2 (two) times daily. Eczema for up to 7-10 days 60 g 2   Coenzyme Q10 150 MG CAPS Take 150 capsules by mouth daily.     EPINEPHrine 0.3 mg/0.3 mL IJ SOAJ injection Inject 0.3 mLs (0.3 mg total) into the muscle as needed for anaphylaxis. 2 each 1   fluticasone (FLONASE) 50 MCG/ACT nasal spray Place 2 sprays into both nostrils daily. 48 g 3   glucose blood (ACCU-CHEK AVIVA) test strip Check blood sugar daily as needed. 100 each 3   glucose blood test strip 1 each by Other route  daily. Use as instructed 100 each 12   hydrochlorothiazide (HYDRODIURIL) 12.5 MG tablet Take 1 tablet (12.5 mg total) by mouth daily. 30 tablet 5   Lancet Devices (ACCU-CHEK SOFTCLIX) lancets 1 each by Other route daily. Use as instructed 1 each 11   lisinopril (ZESTRIL) 40 MG tablet TAKE 1 TABLET(40 MG) BY MOUTH DAILY 90 tablet 2   meloxicam (MOBIC) 7.5 MG tablet TAKE 1 TABLET(7.5 MG) BY MOUTH DAILY 30 tablet 0   Multiple Vitamin (MULTIVITAMIN) tablet Take 1 tablet by mouth daily.     Omega-3 Fatty Acids (RA FISH OIL) 1000 MG CAPS Take by mouth.     Spacer/Aero-Holding Chambers (AEROCHAMBER PLUS) inhaler Use with inhaler 1 each 2   vitamin B-12 (CYANOCOBALAMIN) 500 MCG tablet Take 500 mcg by mouth daily.     lovastatin (MEVACOR) 10 MG tablet Take 1 tablet (10 mg total) by mouth once a week. 13 tablet 3   tamsulosin (FLOMAX) 0.4 MG CAPS capsule Take 1 capsule (0.4 mg total) by mouth daily after breakfast. 90 capsule 3   No current facility-administered medications for this visit.     Objective:  BP (!) 157/88 Comment: most recent home reading this morning  Pulse (!) 57   Temp (!) 97.2 F (36.2 C)   Ht 5\' 6"  (1.676 m)   Wt 163 lb  9.6 oz (74.2 kg)   SpO2 99%   BMI 26.41 kg/m  Gen: NAD, resting comfortably    Assessment and Plan   Atypical chest pain # Dull ache/discomfort in chest  S:patient has noted a dull ache starting Saturday at home- was pretty noticeable as far as consistency and up to 2-3/10. Was not exercising when started- was just doing light work around him. No shortness of breath or left neck pain. Perhaps mild left shoulder pain. He was able to still be active like pull in heavy trashcan down long driveway without any worsening. Does not worsen with activity.  More left upper chest and into shoulder- no central chest discomfort. No fever, chills, cough. Has gradually felt better since Saturday- very minimal discomfort less than 1/10 today.  A/P: with chest discomfort in  62 year old gentleman with diabetes and not fully controlled cholesterol although there are not typical features- we opted to refer to cardiology to be on the safe/cautious side- if he has worsening symptoms or new symptoms like shortness of breath should seek care but glad symptoms have almost fully resolved . Plus check bloodwork  Essential hypertension S: Compliant with lisinopril 40 mg BP Readings from Last 3 Encounters:  05/03/23 (!) 157/88  03/16/23 112/86  01/31/23 124/80  A/P:  blood pressure above goal but was controlled at last visit with Dr. Katrinka Blazing other than diastolic slightly high- he reports it was doing well at home until started feeling poorly with chest discomfort- see above- we discussed chance blood pressure itself could be contributing to discomfort- we opted to trial hydrochlorothiazide 12.5 mg daily and he will update me in a week or two with home readings  Recommended follow up: Return in about 6 months (around 11/02/2023) for followup or sooner if needed.Schedule b4 you leave. Future Appointments  Date Time Provider Department Center  05/11/2023  8:45 AM Judi Saa, DO [facility] None    Lab/Order associations:   ICD-10-CM   1. Atypical chest pain  R07.89 Ambulatory referral to Cardiology    2. Essential hypertension  I10 Urinalysis, Routine w reflex microscopic       Meds ordered this encounter  Medications   hydrochlorothiazide (HYDRODIURIL) 12.5 MG tablet    Sig: Take 1 tablet (12.5 mg total) by mouth daily.    Dispense:  30 tablet    Refill:  5    Return precautions advised.  Tana Conch, MD

## 2023-05-05 ENCOUNTER — Other Ambulatory Visit: Payer: Self-pay | Admitting: Family Medicine

## 2023-05-08 ENCOUNTER — Other Ambulatory Visit: Payer: Self-pay

## 2023-05-08 DIAGNOSIS — R972 Elevated prostate specific antigen [PSA]: Secondary | ICD-10-CM

## 2023-05-10 NOTE — Progress Notes (Signed)
[name] [address] [place] [address] Phone: [phone] Subjective:   Alex [name], am serving as a scribe for Dr. Antoine Primas.  I'm seeing this patient by the request  of:  Shelva Majestic, MD  CC: Back and neck pain follow-up  [id number]  Alex [name] is a 62 y.o. male coming in with complaint of back and neck pain. OMT 03/16/2023. Patient states that he is doing well. Stiffness in his neck this morning.   Medications patient has been prescribed: Meloxicam  Taking:         Reviewed prior external information including notes and imaging from previsou exam, outside providers and external EMR if available.   As well as notes that were available from care everywhere and other healthcare systems.  Past medical history, social, surgical and family history all reviewed in electronic medical record.  No pertanent information unless stated regarding to the chief complaint.   Past Medical History:  Diagnosis Date   Allergy    flonase   Anxiety    OCC   Arthritis    COVID-19    Diabetes mellitus    diet controlled   GERD (gastroesophageal reflux disease)    past hx lost weight   Hemorrhoids    occ inflammation   Hx of adenomatous polyp of colon 04/28/2017   Mixed hyperlipidemia    Nonspecific elevation of levels of transaminase or lactic acid dehydrogenase (LDH)    Unspecified essential hypertension     Allergies  Allergen Reactions   Amlodipine     gingival hyperplasia      Review of Systems:  No headache, visual changes, nausea, vomiting, diarrhea, constipation, dizziness, abdominal pain, skin rash, fevers, chills, night sweats, weight loss, swollen lymph nodes, body aches, joint swelling, chest pain, shortness of breath, mood changes. POSITIVE muscle aches  Objective  Blood pressure 130/78, pulse 77, height 5\' 6"  (1.676 m), weight 165 lb (74.8 kg), SpO2 96 %.   General: No apparent distress alert and  oriented x3 mood and affect normal, dressed appropriately.  HEENT: Pupils equal, extraocular movements intact  Respiratory: Patient's speak in full sentences and does not appear short of breath  Cardiovascular: No lower extremity edema, non tender, no erythema  Back exam does have some mild loss of lordosis.  Some tightness noted in the right side of the paraspinal musculature in the right sacroiliac joint.  Negative straight leg test.  Some mild tightness with FABER compared to the contralateral side.  Osteopathic findings  C3 flexed rotated and side bent right C6 flexed rotated and side bent right T3 extended rotated and side bent right inhaled rib T6 extended rotated and side bent left L3 flexed rotated and side bent right Sacrum right on right     Assessment and Plan:  Low back pain Low back exam does have some loss of lordosis noted.  Some tenderness to palpation in the paraspinal musculature.  Discussed topical anti-inflammatories when needed.  No other changes in medication.  Follow-up again in 6 to 8 weeks.    Nonallopathic problems  Decision today to treat with OMT was based on Physical Exam  After verbal consent patient was treated with HVLA, ME, FPR techniques in cervical, rib, thoracic, lumbar, and sacral  areas  Patient tolerated the procedure well with improvement in symptoms  Patient given exercises, stretches and lifestyle modifications  See medications in patient instructions if given  Patient will follow up in 4-8 weeks  The above documentation has been reviewed and is accurate and complete Lyndal Pulley, DO          Note: This dictation was prepared with Dragon dictation along with smaller phrase technology. Any transcriptional errors that result from this process are unintentional.

## 2023-05-11 ENCOUNTER — Ambulatory Visit: Payer: 59 | Admitting: Family Medicine

## 2023-05-11 ENCOUNTER — Encounter: Payer: Self-pay | Admitting: Family Medicine

## 2023-05-11 VITALS — BP 130/78 | HR 77 | Ht 66.0 in | Wt 165.0 lb

## 2023-05-11 DIAGNOSIS — M9904 Segmental and somatic dysfunction of sacral region: Secondary | ICD-10-CM

## 2023-05-11 DIAGNOSIS — M9908 Segmental and somatic dysfunction of rib cage: Secondary | ICD-10-CM | POA: Diagnosis not present

## 2023-05-11 DIAGNOSIS — M9901 Segmental and somatic dysfunction of cervical region: Secondary | ICD-10-CM | POA: Diagnosis not present

## 2023-05-11 DIAGNOSIS — M9903 Segmental and somatic dysfunction of lumbar region: Secondary | ICD-10-CM

## 2023-05-11 DIAGNOSIS — M545 Low back pain, unspecified: Secondary | ICD-10-CM

## 2023-05-11 DIAGNOSIS — M9902 Segmental and somatic dysfunction of thoracic region: Secondary | ICD-10-CM

## 2023-05-11 DIAGNOSIS — G8929 Other chronic pain: Secondary | ICD-10-CM | POA: Diagnosis not present

## 2023-05-11 NOTE — Assessment & Plan Note (Signed)
Low back exam does have some loss of lordosis noted.  Some tenderness to palpation in the paraspinal musculature.  Discussed topical anti-inflammatories when needed.  No other changes in medication.  Follow-up again in 6 to 8 weeks.

## 2023-05-11 NOTE — Patient Instructions (Signed)
Great to see you Making strides Making retirement looking good See me in 2-3 months

## 2023-05-23 LAB — HM DIABETES EYE EXAM

## 2023-06-04 ENCOUNTER — Other Ambulatory Visit: Payer: Self-pay | Admitting: Family Medicine

## 2023-06-06 ENCOUNTER — Ambulatory Visit: Payer: 59 | Attending: Internal Medicine | Admitting: Internal Medicine

## 2023-06-06 ENCOUNTER — Encounter: Payer: Self-pay | Admitting: Internal Medicine

## 2023-06-06 VITALS — BP 118/72 | HR 87 | Ht 66.0 in | Wt 168.0 lb

## 2023-06-06 DIAGNOSIS — I10 Essential (primary) hypertension: Secondary | ICD-10-CM | POA: Diagnosis not present

## 2023-06-06 DIAGNOSIS — R0789 Other chest pain: Secondary | ICD-10-CM

## 2023-06-06 NOTE — Patient Instructions (Signed)
Medication Instructions:  Your physician recommends that you continue on your current medications as directed. Please refer to the Current Medication list given to you today.    Labwork: None  Testing/Procedures: None  Follow-Up: Your physician recommends that you schedule a follow-up appointment in: As needed.   Any Other Special Instructions Will Be Listed Below (If Applicable).     If you need a refill on your cardiac medications before your next appointment, please call your pharmacy.   

## 2023-06-06 NOTE — Progress Notes (Signed)
Cardiology Office Note  Date: 06/06/2023   Alex [name], DOB 03-09-1961, MRN [id number]  PCP:  Shelva Majestic, MD  Cardiologist:  Marjo Bicker, MD Electrophysiologist:  None   Reason for Office Visit: Chest pain evaluation   History of Present Illness: Alex [name] is a 62 y.o. male known to have HTN, diet-controlled diabetes was referred to cardiology clinic for evaluation of chest pain.  Patient started to have chest pains last month when his HTN was poorly controlled.  His blood pressures were ranging around 150 mmHg SBP.  After his PCP added HCTZ 12.5 mg once daily, his BP came down and currently ranging around 120 mmHg SBP.  His chest pain is also resolved. No recurrence of chest pains after adding HCTZ.. No DOE, orthopnea, PND. No dizziness, palpitations, syncope or leg swelling. His father passed away with a massive heart attack in her late 6s.  Past Medical History:  Diagnosis Date   Allergy    flonase   Anxiety    OCC   Arthritis    COVID-19    Diabetes mellitus    diet controlled   GERD (gastroesophageal reflux disease)    past hx lost weight   Hemorrhoids    occ inflammation   Hx of adenomatous polyp of colon 04/28/2017   Mixed hyperlipidemia    Nonspecific elevation of levels of transaminase or lactic acid dehydrogenase (LDH)    Unspecified essential hypertension     Past Surgical History:  Procedure Laterality Date   APPENDECTOMY  1965   COLONOSCOPY     POLYPECTOMY      Current Outpatient Medications  Medication Sig Dispense Refill   albuterol (VENTOLIN HFA) 108 (90 Base) MCG/ACT inhaler Inhale 1-2 puffs into the lungs every 4 (four) hours as needed for wheezing or shortness of breath. 1 each 0   clobetasol cream (TEMOVATE) 0.05 % Apply 1 application topically 2 (two) times daily. Eczema for up to 7-10 days 60 g 2   Coenzyme Q10 150 MG CAPS Take 150 capsules by mouth daily.     EPINEPHrine 0.3 mg/0.3 mL IJ SOAJ injection Inject 0.3  mLs (0.3 mg total) into the muscle as needed for anaphylaxis. 2 each 1   fluticasone (FLONASE) 50 MCG/ACT nasal spray Place 2 sprays into both nostrils daily. 48 g 3   glucose blood (ACCU-CHEK AVIVA) test strip Check blood sugar daily as needed. 100 each 3   glucose blood test strip 1 each by Other route daily. Use as instructed 100 each 12   hydrochlorothiazide (HYDRODIURIL) 12.5 MG tablet Take 1 tablet (12.5 mg total) by mouth daily. 30 tablet 5   Lancet Devices (ACCU-CHEK SOFTCLIX) lancets 1 each by Other route daily. Use as instructed 1 each 11   lisinopril (ZESTRIL) 40 MG tablet TAKE 1 TABLET(40 MG) BY MOUTH DAILY 90 tablet 2   lovastatin (MEVACOR) 10 MG tablet Take 1 tablet (10 mg total) by mouth once a week. 13 tablet 3   meloxicam (MOBIC) 7.5 MG tablet TAKE 1 TABLET(7.5 MG) BY MOUTH DAILY 30 tablet 0   Multiple Vitamin (MULTIVITAMIN) tablet Take 1 tablet by mouth daily.     Omega-3 Fatty Acids (RA FISH OIL) 1000 MG CAPS Take by mouth as directed.  Takes occasionally     Spacer/Aero-Holding Chambers (AEROCHAMBER PLUS) inhaler Use with inhaler 1 each 2   tamsulosin (FLOMAX) 0.4 MG CAPS capsule Take 1 capsule (0.4 mg total) by mouth daily after breakfast. 90 capsule 3  vitamin B-12 (CYANOCOBALAMIN) 500 MCG tablet Take 500 mcg by mouth daily.     No current facility-administered medications for this visit.   Allergies:  Amlodipine   Social History: The patient  reports that he has never smoked. He has never used smokeless tobacco. He reports current alcohol use of about 1.0 standard drink of alcohol per week. He reports that he does not use drugs.   Family History: The patient's family history includes COPD in his mother; Coronary artery disease in his father; Emphysema in his mother; Heart attack in his father; Heart disease in his father.   ROS:  Please see the history of present illness. Otherwise, complete review of systems is positive for none  All other systems are reviewed and  negative.   Physical Exam: VS:  BP 118/72   Pulse 87   Ht 5\' 6"  (1.676 m)   Wt 168 lb (76.2 kg)   SpO2 92%   BMI 27.12 kg/m , BMI Body mass index is 27.12 kg/m.  Wt Readings from Last 3 Encounters:  06/06/23 168 lb (76.2 kg)  05/11/23 165 lb (74.8 kg)  05/03/23 163 lb 9.6 oz (74.2 kg)    General: Patient appears comfortable at rest. HEENT: Conjunctiva and lids normal, oropharynx clear with moist mucosa. Neck: Supple, no elevated JVP or carotid bruits, no thyromegaly. Lungs: Clear to auscultation, nonlabored breathing at rest. Cardiac: Regular rate and rhythm, no S3 or significant systolic murmur, no pericardial rub. Abdomen: Soft, nontender, no hepatomegaly, bowel sounds present, no guarding or rebound. Extremities: No pitting edema, distal pulses 2+. Skin: Warm and dry. Musculoskeletal: No kyphosis. Neuropsychiatric: Alert and oriented x3, affect grossly appropriate.  Recent Labwork: 05/03/2023: ALT 29; AST 29; BUN 17; Creatinine, Ser 0.89; Hemoglobin 14.5; Platelets 280.0; Potassium 4.3; Sodium 138; TSH 2.45     Component Value Date/Time   CHOL 231 (H) 05/03/2023 0847   TRIG 220.0 (H) 05/03/2023 0847   HDL 38.80 (L) 05/03/2023 0847   CHOLHDL 6 05/03/2023 0847   VLDL 44.0 (H) 05/03/2023 0847   LDLCALC 137 (H) 03/31/2022 0909   LDLDIRECT 154.0 05/03/2023 0847    Assessment and Plan:   # Chest pain likely secondary to poorly controlled HTN -Chest pains are resolved after adding HCTZ 12.5 mg once daily by his PCP. Blood pressures are adequately controlled after adding HCTZ. If he has any recurrent chest pains in the future, he will benefit from NM stress test.  # HTN, controlled -Continue HCTZ 12.5 mg once daily and lisinopril 40 mg once daily.  I have spent a total of 30 minutes with patient reviewing chart, EKGs, labs and examining patient as well as establishing an assessment and plan that was discussed with the patient.  > 50% of time was spent in direct patient  care.    Medication Adjustments/Labs and Tests Ordered: Current medicines are reviewed at length with the patient today.  Concerns regarding medicines are outlined above.   Tests Ordered: No orders of the defined types were placed in this encounter.   Medication Changes: No orders of the defined types were placed in this encounter.   Disposition:  Follow up prn  Signed Brandace Cargle Verne Spurr, MD, 06/06/2023 3:04 PM    [facility] [address] [place], [place], [place] [address]

## 2023-06-07 ENCOUNTER — Encounter: Payer: Self-pay | Admitting: Family Medicine

## 2023-06-08 ENCOUNTER — Other Ambulatory Visit: Payer: 59

## 2023-06-08 DIAGNOSIS — R972 Elevated prostate specific antigen [PSA]: Secondary | ICD-10-CM

## 2023-06-08 LAB — PSA: PSA: 1.87 ng/mL (ref 0.10–4.00)

## 2023-06-30 ENCOUNTER — Other Ambulatory Visit: Payer: Self-pay | Admitting: Family Medicine

## 2023-07-12 NOTE — Progress Notes (Unsigned)
[name] [address] [place] [address] Phone: [phone] Subjective:   Alex [name], am serving as a scribe for Dr. Antoine Primas.  I'm seeing this patient by the request  of:  Shelva Majestic, MD  CC: Back and neck pain follow-up  [id number]  Alex [name] is a 62 y.o. male coming in with complaint of back and neck pain. OMT 05/11/2023. Patient states doing well. Everything about the same. No new concerns.  Has had both of the tightness on the right side of the lower back.  Not stopping him from activities but just very aware of it still.  Still would state that he is better than where we started the first time we met.  Medications patient has been prescribed: Meloxicam  Taking: yes         Reviewed prior external information including notes and imaging from previsou exam, outside providers and external EMR if available.   As well as notes that were available from care everywhere and other healthcare systems.  Past medical history, social, surgical and family history all reviewed in electronic medical record.  No pertanent information unless stated regarding to the chief complaint.   Past Medical History:  Diagnosis Date   Allergy    flonase   Anxiety    OCC   Arthritis    COVID-19    Diabetes mellitus    diet controlled   GERD (gastroesophageal reflux disease)    past hx lost weight   Hemorrhoids    occ inflammation   Hx of adenomatous polyp of colon 04/28/2017   Mixed hyperlipidemia    Nonspecific elevation of levels of transaminase or lactic acid dehydrogenase (LDH)    Unspecified essential hypertension     Allergies  Allergen Reactions   Amlodipine     gingival hyperplasia      Review of Systems:  No headache, visual changes, nausea, vomiting, diarrhea, constipation, dizziness, abdominal pain, skin rash, fevers, chills, night sweats, weight loss, swollen lymph nodes, body aches, joint swelling, chest  pain, shortness of breath, mood changes. POSITIVE muscle aches  Objective  Blood pressure 122/82, pulse 61, height 5\' 6"  (1.676 m), SpO2 97%.   General: No apparent distress alert and oriented x3 mood and affect normal, dressed appropriately.  HEENT: Pupils equal, extraocular movements intact  Respiratory: Patient's speak in full sentences and does not appear short of breath  Cardiovascular: No lower extremity edema, non tender, no erythema  MSK:  Back pain   Osteopathic findings  C2 flexed rotated and side bent right C7 flexed rotated and side bent left T3 extended rotated and side bent right inhaled rib T8 extended rotated and side bent left L1 flexed rotated and side bent right L3 flexed rotated and side bent left Sacrum right on right    Assessment and Plan:  Low back pain Continues to have some tightness more on the right side of the low lumbar spine.  We discussed with patient again still about different ergonomics throughout the day that could be potentially contributing.  Discussed icing regimen and home exercises.  Discussed which activities to do and which ones to avoid.  Increase activity slowly otherwise.  Follow-up with me again in 6 to 8 weeks otherwise.    Nonallopathic problems  Decision today to treat with OMT was based on Physical Exam  After verbal consent patient was treated with HVLA, ME, FPR techniques in cervical, rib, thoracic, lumbar, and sacral  areas  Patient tolerated the procedure well with improvement in symptoms  Patient given exercises, stretches and lifestyle modifications  See medications in patient instructions if given  Patient will follow up in 4-8 weeks     The above documentation has been reviewed and is accurate and complete Judi Saa, DO         Note: This dictation was prepared with Dragon dictation along with smaller phrase technology. Any transcriptional errors that result from this process are unintentional.

## 2023-07-13 ENCOUNTER — Encounter: Payer: Self-pay | Admitting: Family Medicine

## 2023-07-13 ENCOUNTER — Ambulatory Visit: Payer: 59 | Admitting: Family Medicine

## 2023-07-13 VITALS — BP 122/82 | HR 61 | Ht 66.0 in

## 2023-07-13 DIAGNOSIS — G8929 Other chronic pain: Secondary | ICD-10-CM | POA: Diagnosis not present

## 2023-07-13 DIAGNOSIS — M9901 Segmental and somatic dysfunction of cervical region: Secondary | ICD-10-CM

## 2023-07-13 DIAGNOSIS — M9908 Segmental and somatic dysfunction of rib cage: Secondary | ICD-10-CM

## 2023-07-13 DIAGNOSIS — M9903 Segmental and somatic dysfunction of lumbar region: Secondary | ICD-10-CM | POA: Diagnosis not present

## 2023-07-13 DIAGNOSIS — M545 Low back pain, unspecified: Secondary | ICD-10-CM | POA: Diagnosis not present

## 2023-07-13 DIAGNOSIS — M9902 Segmental and somatic dysfunction of thoracic region: Secondary | ICD-10-CM

## 2023-07-13 DIAGNOSIS — M9904 Segmental and somatic dysfunction of sacral region: Secondary | ICD-10-CM | POA: Diagnosis not present

## 2023-07-13 NOTE — Patient Instructions (Signed)
Good to see you! Remember the ball trick See you again in 2 month

## 2023-07-13 NOTE — Assessment & Plan Note (Signed)
Continues to have some tightness more on the right side of the low lumbar spine.  We discussed with patient again still about different ergonomics throughout the day that could be potentially contributing.  Discussed icing regimen and home exercises.  Discussed which activities to do and which ones to avoid.  Increase activity slowly otherwise.  Follow-up with me again in 6 to 8 weeks otherwise.

## 2023-07-28 ENCOUNTER — Other Ambulatory Visit: Payer: Self-pay | Admitting: Family Medicine

## 2023-08-23 ENCOUNTER — Other Ambulatory Visit: Payer: Self-pay | Admitting: Family Medicine

## 2023-08-29 NOTE — Progress Notes (Unsigned)
  [name] [address] [place] [address] Phone: [phone] Subjective:   Alex [name], am serving as a scribe for Dr. Antoine Primas.  I'm seeing this patient by the request  of:  Shelva Majestic, MD  CC: Low back pain follow-up  [id number]  Alex [name] is a 62 y.o. male coming in with complaint of back and neck pain. OMT 07/13/2023. Patient states that he is better than he has been in the past but does still get stiff. Manipulation is helpful.   Medications patient has been prescribed: Meloxicam  Taking: Yes intermittently      Past Medical History:  Diagnosis Date   Allergy    flonase   Anxiety    OCC   Arthritis    COVID-19    Diabetes mellitus    diet controlled   GERD (gastroesophageal reflux disease)    past hx lost weight   Hemorrhoids    occ inflammation   Hx of adenomatous polyp of colon 04/28/2017   Mixed hyperlipidemia    Nonspecific elevation of levels of transaminase or lactic acid dehydrogenase (LDH)    Unspecified essential hypertension     Allergies  Allergen Reactions   Amlodipine     gingival hyperplasia        Objective  Blood pressure 112/82, pulse (!) 56, height 5\' 6"  (1.676 m), weight 168 lb (76.2 kg), SpO2 96%.   General: No apparent distress alert and oriented x3 mood and affect normal, dressed appropriately.  HEENT: Pupils equal, extraocular movements intact  Respiratory: Patient's speak in full sentences and does not appear short of breath  Cardiovascular: No lower extremity edema, non tender, no erythema  Low back some mild loss lordosis noted.  Some tenderness to palpation in the paraspinal musculature.  Tightness with straight leg test noted.  Osteopathic findings  C2 flexed rotated and side bent right C6 flexed rotated and side bent left T3 extended rotated and side bent right inhaled rib T9 extended rotated and side bent left L2 flexed rotated and side bent right Sacrum  right on right       Assessment and Plan:  Low back pain Overall doing extremely well, no pain that is stopping him from activities, but is still very aware of it, continues to work on core strengthening regimen.  Patient has been doing more traveling which can come every 2 to 3 months for further evaluation and treatment.    Nonallopathic problems  Decision today to treat with OMT was based on Physical Exam  After verbal consent patient was treated with HVLA, ME, FPR techniques in cervical, rib, thoracic, lumbar, and sacral  areas  Patient tolerated the procedure well with improvement in symptoms  Patient given exercises, stretches and lifestyle modifications  See medications in patient instructions if given  Patient will follow up in 4-8 weeks     The above documentation has been reviewed and is accurate and complete Judi Saa, DO         Note: This dictation was prepared with Dragon dictation along with smaller phrase technology. Any transcriptional errors that result from this process are unintentional.

## 2023-08-30 ENCOUNTER — Encounter: Payer: Self-pay | Admitting: Family Medicine

## 2023-08-30 ENCOUNTER — Ambulatory Visit: Payer: 59 | Admitting: Family Medicine

## 2023-08-30 VITALS — BP 112/82 | HR 56 | Ht 66.0 in | Wt 168.0 lb

## 2023-08-30 VITALS — BP 133/78 | HR 56 | Temp 98.2°F | Ht 66.0 in | Wt 168.0 lb

## 2023-08-30 DIAGNOSIS — M9901 Segmental and somatic dysfunction of cervical region: Secondary | ICD-10-CM | POA: Diagnosis not present

## 2023-08-30 DIAGNOSIS — M9904 Segmental and somatic dysfunction of sacral region: Secondary | ICD-10-CM

## 2023-08-30 DIAGNOSIS — E785 Hyperlipidemia, unspecified: Secondary | ICD-10-CM

## 2023-08-30 DIAGNOSIS — Z23 Encounter for immunization: Secondary | ICD-10-CM

## 2023-08-30 DIAGNOSIS — R079 Chest pain, unspecified: Secondary | ICD-10-CM

## 2023-08-30 DIAGNOSIS — E1169 Type 2 diabetes mellitus with other specified complication: Secondary | ICD-10-CM | POA: Diagnosis not present

## 2023-08-30 DIAGNOSIS — M9908 Segmental and somatic dysfunction of rib cage: Secondary | ICD-10-CM

## 2023-08-30 DIAGNOSIS — M545 Low back pain, unspecified: Secondary | ICD-10-CM | POA: Diagnosis not present

## 2023-08-30 DIAGNOSIS — M9903 Segmental and somatic dysfunction of lumbar region: Secondary | ICD-10-CM | POA: Diagnosis not present

## 2023-08-30 DIAGNOSIS — M9902 Segmental and somatic dysfunction of thoracic region: Secondary | ICD-10-CM

## 2023-08-30 DIAGNOSIS — G8929 Other chronic pain: Secondary | ICD-10-CM

## 2023-08-30 DIAGNOSIS — I10 Essential (primary) hypertension: Secondary | ICD-10-CM | POA: Diagnosis not present

## 2023-08-30 DIAGNOSIS — E119 Type 2 diabetes mellitus without complications: Secondary | ICD-10-CM

## 2023-08-30 NOTE — Progress Notes (Signed)
   Alex [name] is a 62 y.o. male who presents today for an office visit.  Assessment/Plan:  New/Acute Problems: Chest Pain  History atypical however he does have several risk factors for coronary artery disease including hypertension, dyslipidemia, and diabetes.  He would like to have stress testing performed.  Will place referral to cardiology [facility] to help set this up.  We discussed reasons to return to care and seek emergent care.  Chronic Problems Addressed Today: Essential Hypertension  At goal today on lisinopril 40 mg daily and hydrochlorothiazide 12.5 mg daily. He was having some low reading with Flomax but this has stopped since stopping the Flomax.  Discussed with patient this is a common side effect of Flomax and that he should continue to stay off of this for now.  He will continue current regimen with lisinopril 40 mg daily and HCTZ 12.5 mg daily and let us know if he has any recurrence of lows.  HLD-On lovastatin 10 mg weekly though does increase risk for coronary artery disease.  Last LDL at 154.  May need to increase dose of statin depending on CAD workup as above.  Will defer this to PCP.  TYPE 2 DIABETES MELLITUS -diet controlled.  Last A1c 6.0.  Will defer further management to PCP though may benefit from starting SGLT2 inhibitor or GLP-1 agonist depending on CAD workup as above.    Subjective:  HPI:  See Assessment / plan for status of chronic conditions.  Patient here today for follow up. He saw his PCP a few months ago for annual physical. At that time his PCP added on hydrochlorothiazide 12.5 mg daily for hypertension. HE was also having intermittent chest pain at that time.  Described as a dull ache in the upper part of his chest potentially radiating into his left shoulder.  No pain with exertion.  No substernal chest pressure pain.  He was referred to cardiology.  By the time of this visit with cardiology his chest pain had resolved.  He has noticed that when  he takes his Flomax, his blood pressure will drop significantly into the 90's/50's. HE has stopped taking the Flomax and he has not had any recurrence since then.   Over the last several weeks he has started having recurrence of his chest discomfort that he was experiencing a few months ago.  Similar nature with a dull achy sensation in his upper chest.  Symptoms are not worsened with activity.  He may feel little more short of breath with exertion but otherwise feels at baseline.  No current chest pain.        Objective:  Physical Exam: BP 133/78   Pulse (!) 56   Temp 98.2 F (36.8 C) (Temporal)   Ht 5\' 6"  (1.676 m)   Wt 168 lb (76.2 kg)   SpO2 96%   BMI 27.12 kg/m   Gen: No acute distress, resting comfortably CV: Regular rate and rhythm with no murmurs appreciated Pulm: Normal work of breathing, clear to auscultation bilaterally with no crackles, wheezes, or rhonchi Neuro: Grossly normal, moves all extremities Psych: Normal affect and thought content  Preventative Healthcare Flu shot given today.      Katina Degree. Jimmey Ralph, MD 08/30/2023 8:11 AM

## 2023-08-30 NOTE — Assessment & Plan Note (Signed)
Overall doing extremely well, no pain that is stopping him from activities, but is still very aware of it, continues to work on core strengthening regimen.  Patient has been doing more traveling which can come every 2 to 3 months for further evaluation and treatment.

## 2023-08-30 NOTE — Patient Instructions (Signed)
It was very nice to see you today!  I will refer you to see the cardiologist at [facility].  Please let us know if you need any further assistance.  We will continue your current blood pressure medications for now.  Return if symptoms worsen or fail to improve.   Take care, Dr Jimmey Ralph  PLEASE NOTE:  If you had any lab tests, please let us know if you have not heard back within a few days. You may see your results on mychart before we have a chance to review them but we will give you a call once they are reviewed by [place].   If we ordered any referrals today, please let us know if you have not heard from their office within the next week.   If you had any urgent prescriptions sent in today, please check with the pharmacy within an hour of our visit to make sure the prescription was transmitted appropriately.   Please try these tips to maintain a healthy lifestyle:  Eat at least 3 REAL meals and 1-2 snacks per day.  Aim for no more than 5 hours between eating.  If you eat breakfast, please do so within one hour of getting up.   Each meal should contain half fruits/vegetables, one quarter protein, and one quarter carbs (no bigger than a computer mouse)  Cut down on sweet beverages. This includes juice, soda, and sweet tea.   Drink at least 1 glass of water with each meal and aim for at least 8 glasses per day  Exercise at least 150 minutes every week.

## 2023-09-01 ENCOUNTER — Other Ambulatory Visit: Payer: Self-pay | Admitting: Family Medicine

## 2023-09-04 ENCOUNTER — Telehealth: Payer: Self-pay | Admitting: Internal Medicine

## 2023-09-04 NOTE — Telephone Encounter (Signed)
Pt would like to do a provider switch  From: Dr Jenene Slicker  To: Dr Elease Hashimoto

## 2023-09-19 ENCOUNTER — Other Ambulatory Visit: Payer: Self-pay | Admitting: Family Medicine

## 2023-09-22 NOTE — Telephone Encounter (Signed)
Pt would like to transfer to [facility]

## 2023-10-02 ENCOUNTER — Ambulatory Visit: Payer: 59 | Attending: Cardiology | Admitting: Cardiology

## 2023-10-02 ENCOUNTER — Encounter: Payer: Self-pay | Admitting: Cardiology

## 2023-10-02 VITALS — BP 138/80 | HR 66 | Ht 66.0 in | Wt 170.2 lb

## 2023-10-02 DIAGNOSIS — Z01812 Encounter for preprocedural laboratory examination: Secondary | ICD-10-CM

## 2023-10-02 DIAGNOSIS — R072 Precordial pain: Secondary | ICD-10-CM | POA: Diagnosis not present

## 2023-10-02 DIAGNOSIS — R0789 Other chest pain: Secondary | ICD-10-CM

## 2023-10-02 DIAGNOSIS — I10 Essential (primary) hypertension: Secondary | ICD-10-CM

## 2023-10-02 MED ORDER — METOPROLOL TARTRATE 50 MG PO TABS: 50.0000 mg | ORAL_TABLET | ORAL | 0 refills | Status: DC

## 2023-10-02 NOTE — Patient Instructions (Signed)
Medication Instructions:  The current medical regimen is effective;  continue present plan and medications.  *If you need a refill on your cardiac medications before your next appointment, please call your pharmacy*   Lab Work: Please have blood work today (BMP)  If you have labs (blood work) drawn today and your tests are completely normal, you will receive your results only by: MyChart Message (if you have MyChart) OR A paper copy in the mail If you have any lab test that is abnormal or we need to change your treatment, we will call you to review the results.   Testing/Procedures:   Your cardiac CT will be scheduled at:   [facility] [address] [place], [place] [address] [phone]  Please arrive at the [facility] and Children's Entrance (Entrance C2) of [facility] 30 minutes prior to test start time. You can use the FREE valet parking offered at entrance C (encouraged to control the heart rate for the test)  Proceed to the [facility] Radiology Department (first floor) to check-in and test prep.  All radiology patients and guests should use entrance C2 at [facility], accessed from [facility], even though the hospital's physical address listed is [address].     Please follow these instructions carefully (unless otherwise directed):  An IV will be required for this test and Nitroglycerin will be given.  Hold all erectile dysfunction medications at least 3 days (72 hrs) prior to test. (Ie viagra, cialis, sildenafil, tadalafil, etc)   On the Night Before the Test: Be sure to Drink plenty of water. Do not consume any caffeinated/decaffeinated beverages or chocolate 12 hours prior to your test. Do not take any antihistamines 12 hours prior to your test.  On the Day of the Test: Drink plenty of water until 1 hour prior to the test. Do not eat any food 1 hour prior to test. You may take your regular medications  prior to the test.  Take metoprolol (Lopressor) two hours prior to test. If you take Furosemide/Hydrochlorothiazide/Spironolactone, please HOLD on the morning of the test.  After the Test: Drink plenty of water. After receiving IV contrast, you may experience a mild flushed feeling. This is normal. On occasion, you may experience a mild rash up to 24 hours after the test. This is not dangerous. If this occurs, you can take Benadryl 25 mg and increase your fluid intake. If you experience trouble breathing, this can be serious. If it is severe call 911 IMMEDIATELY. If it is mild, please call our office. If you take any of these medications: Glipizide/Metformin, Avandament, Glucavance, please do not take 48 hours after completing test unless otherwise instructed.  We will call to schedule your test 2-4 weeks out understanding that some insurance companies will need an authorization prior to the service being performed.   For more information and frequently asked questions, please visit our website : [email or web address]  For non-scheduling related questions, please contact the cardiac imaging nurse navigator should you have any questions/concerns: Cardiac Imaging Nurse Navigators Direct Office Dial: [phone]   For scheduling needs, including cancellations and rescheduling, please call [place], [phone].   Follow-Up: At [facility], you and your health needs are our priority.  As part of our continuing mission to provide you with exceptional heart care, we have created designated Provider Care Teams.  These Care Teams include your primary Cardiologist (physician) and Advanced Practice Providers (APPs -  Physician Assistants and Nurse Practitioners)  who all work together to provide you with the care you need, when you need it.  We recommend signing up for the patient portal called "MyChart".  Sign up information is provided on this After Visit Summary.  MyChart is  used to connect with patients for Virtual Visits (Telemedicine).  Patients are able to view lab/test results, encounter notes, upcoming appointments, etc.  Non-urgent messages can be sent to your provider as well.   To learn more about what you can do with MyChart, go to [email or web address].    Your next appointment:   Follow up will be based on the results of the above testing.

## 2023-10-02 NOTE — Progress Notes (Signed)
Cardiology Office Note:  .   Date:  10/02/2023  ID:  Alex [name], DOB [birth year], MRN [id number] PCP: Shelva Majestic, MD  [facility] Providers Cardiologist:  Marjo Bicker, MD     History of Present Illness: .   Alex [name] is a 62 y.o. male Discussed with the use of AI scribe   History of Present Illness   A 62 year old male with a history of hypertension and a family history of cardiac disease presented for a follow-up evaluation of chest pain. The patient's blood pressure had been poorly controlled, often reaching systolic levels of 150. The patient's father had a fatal heart attack in his late forties.  The patient reported experiencing episodes of chest pain and sought further testing to rule out any blockages. The last cardiac evaluation, a nuclear test, was performed approximately 15-17 years ago. The patient reported changes in sleep patterns and an elevated heart rate since starting hydrochlorothiazide, which was prescribed to further lower his borderline high blood pressure that persisted despite taking lisinopril.  The patient also reported feeling the need to rest after physical exertion, such as mowing the yard, but did not describe this as shortness of breath. The patient denied smoking and reported only occasional social drinking.  The patient had previously been prescribed Tolcylen for kidney stones, but had to discontinue it due to a significant drop in blood pressure. The patient expressed a desire to proactively address any potential cardiac issues to prevent a heart attack.          ROS: No syncope, no bleeding  Studies Reviewed: Marland Kitchen   EKG Interpretation Date/Time:  Monday October 02 2023 15:55:57 EST Ventricular Rate:  66 PR Interval:  168 QRS Duration:  90 QT Interval:  390 QTC Calculation: 408 R Axis:   -58  Text Interpretation: Normal sinus rhythm Left anterior fascicular block When compared with ECG of 18-Mar-2010 07:55, No  significant change was found Confirmed by Donato Schultz ([id number]) on 10/02/2023 4:06:03 PM    Total cholesterol 231 HDL 38 LDL 137 triglycerides 220, hemoglobin A1c 6.0 hemoglobin 14.5 creatinine 0.9 potassium 4.3 ALT 29 TSH 2.4  Prior office note reviewed  Risk Assessment/Calculations:            Physical Exam:   VS:  BP 138/80   Pulse 66   Ht 5\' 6"  (1.676 m)   Wt 170 lb 3.2 oz (77.2 kg)   SpO2 94%   BMI 27.47 kg/m    Wt Readings from Last 3 Encounters:  10/02/23 170 lb 3.2 oz (77.2 kg)  08/30/23 168 lb (76.2 kg)  08/30/23 168 lb (76.2 kg)    GEN: Well nourished, well developed in no acute distress NECK: No JVD; No carotid bruits CARDIAC: RRR, no murmurs, no rubs, no gallops RESPIRATORY:  Clear to auscultation without rales, wheezing or rhonchi  ABDOMEN: Soft, non-tender, non-distended EXTREMITIES:  No edema; No deformity   ASSESSMENT AND PLAN: .    Assessment and Plan    Chest Pain Recurrent episodes of chest pain with a family history of early cardiac death. Last cardiac evaluation was 15-17 years ago. -Order coronary CT scan to evaluate for plaque or blockages.  Hypertension Blood pressure previously poorly controlled, now on Lisinopril and Hydrochlorothiazide. Patient reports possible side effects of Hydrochlorothiazide including increased heart rate and fatigue. -Advise to switch Hydrochlorothiazide to morning dosing. -Continue Lisinopril at night as tolerated.  Kidney Stones History of kidney stones, previously prescribed Tamsulosin (Flomax) for symptomatic  relief. Patient reports significant hypotension with Tamsulosin use. -Advise to discontinue Tamsulosin due to side effects.             Dispo: We will follow-up with study  Signed, Donato Schultz, MD

## 2023-10-03 LAB — BASIC METABOLIC PANEL
BUN/Creatinine Ratio: 20 (ref 10–24)
BUN: 18 mg/dL (ref 8–27)
CO2: 21 mmol/L (ref 20–29)
Calcium: 9.6 mg/dL (ref 8.6–10.2)
Chloride: 98 mmol/L (ref 96–106)
Creatinine, Ser: 0.89 mg/dL (ref 0.76–1.27)
Glucose: 90 mg/dL (ref 70–99)
Potassium: 4.4 mmol/L (ref 3.5–5.2)
Sodium: 139 mmol/L (ref 134–144)
eGFR: 96 mL/min/{1.73_m2} (ref >59)

## 2023-10-09 ENCOUNTER — Other Ambulatory Visit: Payer: Self-pay | Admitting: Family Medicine

## 2023-10-27 ENCOUNTER — Telehealth (HOSPITAL_COMMUNITY): Payer: Self-pay | Admitting: Emergency Medicine

## 2023-10-27 NOTE — Telephone Encounter (Signed)
Reaching out to patient to offer assistance regarding upcoming cardiac imaging study; pt verbalizes understanding of appt date/time, parking situation and where to check in, pre-test NPO status and medications ordered, and verified current allergies; name and call back number provided for further questions should they arise Cayne Yom RN Navigator Cardiac Imaging [facility] [phone] office [phone] cell 

## 2023-10-30 ENCOUNTER — Other Ambulatory Visit: Payer: Self-pay | Admitting: Cardiology

## 2023-10-30 ENCOUNTER — Ambulatory Visit (HOSPITAL_COMMUNITY)
Admission: RE | Admit: 2023-10-30 | Discharge: 2023-10-30 | Disposition: A | Discharge status: Home / Self Care | Payer: 59 | Source: Ambulatory Visit | Attending: Cardiology | Admitting: Cardiology

## 2023-10-30 DIAGNOSIS — R072 Precordial pain: Secondary | ICD-10-CM | POA: Insufficient documentation

## 2023-10-30 DIAGNOSIS — R931 Abnormal findings on diagnostic imaging of heart and coronary circulation: Secondary | ICD-10-CM | POA: Diagnosis not present

## 2023-10-30 DIAGNOSIS — I2510 Atherosclerotic heart disease of native coronary artery without angina pectoris: Secondary | ICD-10-CM | POA: Diagnosis not present

## 2023-10-30 MED ORDER — NITROGLYCERIN 0.4 MG SL SUBL
0.8000 mg | SUBLINGUAL_TABLET | Freq: Once | SUBLINGUAL | Status: AC
  Administered 2023-10-30: 0.8 mg via SUBLINGUAL

## 2023-10-30 MED ORDER — IOHEXOL 350 MG/ML SOLN
100.0000 mL | Freq: Once | INTRAVENOUS | Status: AC | PRN
  Administered 2023-10-30: 100 mL via INTRAVENOUS

## 2023-10-30 MED ORDER — NITROGLYCERIN 0.4 MG SL SUBL
SUBLINGUAL_TABLET | SUBLINGUAL | Status: AC
  Filled 2023-10-30: qty 2

## 2023-10-31 NOTE — Progress Notes (Unsigned)
[name] [address] [place] [address] Phone: [phone] Subjective:   Alex [name], am serving as a scribe for Dr. Antoine Primas.  I'm seeing this patient by the request  of:  Shelva Majestic, MD  CC: Low back pain  HQI:[id number]  Alex [name] is a 62 y.o. male coming in with complaint of back and neck pain. OMT 08/30/2023. Patient states that he has had increase in R sided lower back pain that occurred 1 day after his last visit. No new injury. Has been seeing his chiro for multiple adjustments since last visit. Also has been putting up Christmas decorations recently and he could hardly walk on Sunday. Pain can radiate down in to his hip. Denies any more distal radiation.   Medications patient has been prescribed: Meloxicam  Taking:         Reviewed prior external information including notes and imaging from previsou exam, outside providers and external EMR if available.   As well as notes that were available from care everywhere and other healthcare systems.  Past medical history, social, surgical and family history all reviewed in electronic medical record.  No pertanent information unless stated regarding to the chief complaint.   Past Medical History:  Diagnosis Date   Allergy    flonase   Anxiety    OCC   Arthritis    COVID-19    Diabetes mellitus    diet controlled   GERD (gastroesophageal reflux disease)    past hx lost weight   Hemorrhoids    occ inflammation   Hx of adenomatous polyp of colon 04/28/2017   Mixed hyperlipidemia    Nonspecific elevation of levels of transaminase or lactic acid dehydrogenase (LDH)    Unspecified essential hypertension     Allergies  Allergen Reactions   Amlodipine     gingival hyperplasia      Review of Systems:  No headache, visual changes, nausea, vomiting, diarrhea, constipation, dizziness, abdominal pain, skin rash, fevers, chills, night sweats, weight loss, swollen  lymph nodes, body aches, joint swelling, chest pain, shortness of breath, mood changes. POSITIVE muscle aches  Objective  Blood pressure (!) 128/92, pulse 67, height 5\' 6"  (1.676 m), weight 170 lb (77.1 kg), SpO2 96%.   General: No apparent distress alert and oriented x3 mood and affect normal, dressed appropriately.  HEENT: Pupils equal, extraocular movements intact  Respiratory: Patient's speak in full sentences and does not appear short of breath  Cardiovascular: No lower extremity edema, non tender, no erythema  Gait MSK:  Back low back does have some loss lordosis noted.  Some tightness noted in the paraspinal musculature.  Patient does have significant tightness more on the right side than the left at the moment.  Osteopathic findings  C5 flexed rotated and side bent left T3 extended rotated and side bent right inhaled rib T8 extended rotated and side bent left L1 flexed rotated and side bent right Sacrum right on right       Assessment and Plan:  No problem-specific Assessment & Plan notes found for this encounter.    Nonallopathic problems  Decision today to treat with OMT was based on Physical Exam  After verbal consent patient was treated with HVLA, ME, FPR techniques in cervical, rib, thoracic, lumbar, and sacral  areas  Patient tolerated the procedure well with improvement in symptoms  Patient given exercises, stretches and lifestyle modifications  See medications in patient instructions if given  Patient will  follow up in 4-8 weeks    The above documentation has been reviewed and is accurate and complete Judi Saa, DO          Note: This dictation was prepared with Dragon dictation along with smaller phrase technology. Any transcriptional errors that result from this process are unintentional.

## 2023-11-01 ENCOUNTER — Ambulatory Visit (INDEPENDENT_AMBULATORY_CARE_PROVIDER_SITE_OTHER): Payer: 59

## 2023-11-01 ENCOUNTER — Ambulatory Visit: Payer: 59 | Admitting: Family Medicine

## 2023-11-01 ENCOUNTER — Encounter: Payer: Self-pay | Admitting: Family Medicine

## 2023-11-01 VITALS — BP 128/92 | HR 67 | Ht 66.0 in | Wt 170.0 lb

## 2023-11-01 DIAGNOSIS — M9903 Segmental and somatic dysfunction of lumbar region: Secondary | ICD-10-CM | POA: Diagnosis not present

## 2023-11-01 DIAGNOSIS — M9908 Segmental and somatic dysfunction of rib cage: Secondary | ICD-10-CM

## 2023-11-01 DIAGNOSIS — M9901 Segmental and somatic dysfunction of cervical region: Secondary | ICD-10-CM

## 2023-11-01 DIAGNOSIS — M545 Low back pain, unspecified: Secondary | ICD-10-CM

## 2023-11-01 DIAGNOSIS — M9902 Segmental and somatic dysfunction of thoracic region: Secondary | ICD-10-CM

## 2023-11-01 DIAGNOSIS — G8929 Other chronic pain: Secondary | ICD-10-CM

## 2023-11-01 DIAGNOSIS — M9904 Segmental and somatic dysfunction of sacral region: Secondary | ICD-10-CM

## 2023-11-01 MED ORDER — TIZANIDINE HCL 4 MG PO TABS: 4.0000 mg | ORAL_TABLET | Freq: Every day | ORAL | 0 refills | Status: DC

## 2023-11-01 MED ORDER — METHYLPREDNISOLONE ACETATE 40 MG/ML IJ SUSP
40.0000 mg | Freq: Once | INTRAMUSCULAR | Status: AC
  Administered 2023-11-01: 40 mg via INTRAMUSCULAR

## 2023-11-01 MED ORDER — KETOROLAC TROMETHAMINE 30 MG/ML IJ SOLN
30.0000 mg | Freq: Once | INTRAMUSCULAR | Status: AC
  Administered 2023-11-01: 30 mg via INTRAMUSCULAR

## 2023-11-01 NOTE — Patient Instructions (Addendum)
Injections in backside Zanaflex 4mg  for next 3 nights then as needed Try 1/2 tab if makes you tired Continue meloxicam tmrw Xray today See me in 6-8 weeks

## 2023-11-01 NOTE — Assessment & Plan Note (Signed)
Low back does have more pain than usual.  Zanaflex given.  Discussed icing regimen and home exercises.  Increase activity slowly otherwise.  Does have piriformis syndrome on the right so we will monitor.  Worsening pain do feel advanced imaging may be warranted as well.  Depo-Medrol and Toradol given today.  Follow-up again in 6 to 8 weeks

## 2023-11-02 ENCOUNTER — Other Ambulatory Visit: Payer: Self-pay | Admitting: Family Medicine

## 2023-11-02 ENCOUNTER — Ambulatory Visit: Payer: 59 | Admitting: Family Medicine

## 2023-11-02 ENCOUNTER — Encounter: Payer: Self-pay | Admitting: Family Medicine

## 2023-11-02 VITALS — BP 136/78 | HR 60 | Temp 98.0°F | Wt 170.0 lb

## 2023-11-02 DIAGNOSIS — Z125 Encounter for screening for malignant neoplasm of prostate: Secondary | ICD-10-CM | POA: Diagnosis not present

## 2023-11-02 DIAGNOSIS — E785 Hyperlipidemia, unspecified: Secondary | ICD-10-CM | POA: Diagnosis not present

## 2023-11-02 DIAGNOSIS — E119 Type 2 diabetes mellitus without complications: Secondary | ICD-10-CM

## 2023-11-02 DIAGNOSIS — E1169 Type 2 diabetes mellitus with other specified complication: Secondary | ICD-10-CM | POA: Diagnosis not present

## 2023-11-02 DIAGNOSIS — I10 Essential (primary) hypertension: Secondary | ICD-10-CM | POA: Diagnosis not present

## 2023-11-02 DIAGNOSIS — I2510 Atherosclerotic heart disease of native coronary artery without angina pectoris: Secondary | ICD-10-CM | POA: Insufficient documentation

## 2023-11-02 LAB — COMPREHENSIVE METABOLIC PANEL
ALT: 34 U/L (ref 0–53)
AST: 28 U/L (ref 0–37)
Albumin: 4.6 g/dL (ref 3.5–5.2)
Alkaline Phosphatase: 64 U/L (ref 39–117)
BUN: 18 mg/dL (ref 6–23)
CO2: 29 meq/L (ref 19–32)
Calcium: 10 mg/dL (ref 8.4–10.5)
Chloride: 101 meq/L (ref 96–112)
Creatinine, Ser: 1.01 mg/dL (ref 0.40–1.50)
GFR: 78.53 mL/min (ref >60.00)
Glucose, Bld: 107 mg/dL — ABNORMAL HIGH (ref 70–99)
Potassium: 5 meq/L (ref 3.5–5.1)
Sodium: 139 meq/L (ref 135–145)
Total Bilirubin: 0.7 mg/dL (ref 0.2–1.2)
Total Protein: 7.4 g/dL (ref 6.0–8.3)

## 2023-11-02 LAB — HEMOGLOBIN A1C: Hgb A1c MFr Bld: 6.7 % — ABNORMAL HIGH (ref 4.6–6.5)

## 2023-11-02 LAB — PSA: PSA: 1.97 ng/mL (ref 0.10–4.00)

## 2023-11-02 NOTE — Progress Notes (Signed)
Phone [phone] In person visit   Subjective:   Alex [name] is a 62 y.o. 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   Low back pain 11/04/2022    Priority: 1.   Plantar fasciitis, bilateral 11/04/2022    Priority: 1.   Piriformis syndrome of right side 08/19/2019    Priority: 1.   Atypical chest pain 05/03/2023    Medications- reviewed and updated Current Outpatient Medications  Medication Sig Dispense Refill   aspirin EC 81 MG tablet Take 81 mg by mouth daily. Swallow whole.     clobetasol cream (TEMOVATE) 0.05 % Apply 1 application topically 2 (two) times daily. Eczema for up to 7-10 days 60 g 2   EPINEPHrine 0.3 mg/0.3 mL IJ SOAJ injection Inject 0.3 mLs (0.3 mg total) into the muscle as needed for anaphylaxis. 2 each 1   fluticasone (FLONASE) 50 MCG/ACT nasal spray SHAKE LIQUID AND USE 2 SPRAYS IN EACH NOSTRIL DAILY 48 g 3   glucose blood (ACCU-CHEK AVIVA) test strip Check blood sugar  daily as needed. 100 each 3   glucose blood test strip 1 each by Other route daily. Use as instructed 100 each 12   hydrochlorothiazide (HYDRODIURIL) 12.5 MG tablet TAKE 1 TABLET(12.5 MG) BY MOUTH DAILY 30 tablet 5   Lancet Devices (ACCU-CHEK SOFTCLIX) lancets 1 each by Other route daily. Use as instructed 1 each 11   lisinopril (ZESTRIL) 40 MG tablet TAKE 1 TABLET(40 MG) BY MOUTH DAILY 90 tablet 2   lovastatin (MEVACOR) 10 MG tablet Take 1 tablet (10 mg total) by mouth once a week. 13 tablet 3   Multiple Vitamin (MULTIVITAMIN) tablet Take 1 tablet by mouth daily.     Omega-3 Fatty Acids (RA FISH OIL) 1000 MG CAPS Take by mouth as directed.  Takes occasionally     Spacer/Aero-Holding Chambers (AEROCHAMBER PLUS) inhaler Use with inhaler 1 each 2   tiZANidine (ZANAFLEX) 4 MG tablet Take 1 tablet (4 mg total) by mouth at bedtime. 30 tablet 0   vitamin B-12 (CYANOCOBALAMIN) 500 MCG tablet Take 500 mcg by mouth daily.     meloxicam (MOBIC) 7.5 MG tablet TAKE 1 TABLET(7.5 MG) BY MOUTH DAILY 30 tablet 0   No current facility-administered medications for this visit.     Objective:  BP 136/78   Pulse 60   Temp 98 F (36.7 C)  Wt 170 lb (77.1 kg)   SpO2 100%   BMI 27.44 kg/m  Gen: NAD, resting comfortably CV: RRR no murmurs rubs or gallops Lungs: CTAB no crackles, wheeze, rhonchi Ext: no edema Skin: warm, dry    Assessment and Plan   #Diabetes mellitus-with hypertension and hyperlipidemia S: Medication: none,Patient has been diet and exercise controlled. Lab Results  Component Value Date   HGBA1C 6.0 05/03/2023   HGBA1C 5.5 10/03/2022   HGBA1C 5.8 03/31/2022  A/P: hopefully stable- update a1c today. Continue without meds for now   -up 7 lbs from last visit- encouraged mild weight loss- feels can tighten his diet  #CAD nonobstructive- sees Dr. Anne Fu  #Hyperlipidemia associated with diabetes #statin myopathy S: medication: tolerating lovastatin 10 mg once a week, started on  aspirin after cardiology evaluation -Patient has been intolerant to statin due to myalgias in the past even atorvastatin 40 mg once a week.   - cardiology has referred to lipid clinic in 2024  -does get some dull aches in chest at times but not worsening Lab Results  Component Value Date   CHOL 231 (H) 05/03/2023   HDL 38.80 (L) 05/03/2023   LDLCALC 137 (H) 03/31/2022   LDLDIRECT 154.0 05/03/2023   TRIG 220.0 (H) 05/03/2023   CHOLHDL 6 05/03/2023  A/P: CAD largely asymptomatic - reassuring workup by Dr. Anne Fu- we do need to get cholesterol lower and he is waiting on lipid clinic visit    #Hypertension S: Compliant with lisinopril 40 mg, hydrochlorothiazide 12.5 mg added 2024 BP Readings from Last 3 Encounters:  11/02/23 136/78  11/01/23 (!) 128/92  10/30/23 125/74  A/P: reasonable control- could push for lower- perhaps with lifestyle- continue current medications for now    # BPH with nocturia S:medication: None-tamsulosin 0.4 mg-was getting lightheaded with low blood pressures A/P: some symptoms- leakage after urinating - but not bad enough compared to prior side effects and does not want to see urology yet   #Chronic low back pain- works with Dr. Katrinka Blazing of sports medicine - muscle relaxants as needed as of yesterday- he is hoping for improvement. Somewhat better today after 2 injections-depo medrol and Toradol   #PSA elevation- variability earlier this year- went up- trended down in July but we want to recheck today Lab Results  Component Value Date   PSA 1.87 06/08/2023   PSA 2.02 05/03/2023   PSA 1.44 03/31/2022   Recommended follow up: Return in about 6 months (around 05/02/2024) for physical or sooner if needed.Schedule b4 you leave. Future Appointments  Date Time Provider Department Center  12/21/2023  9:00 AM Judi Saa, DO [facility] None   Lab/Order associations:   ICD-10-CM   1. Essential hypertension  I10     2. Hyperlipidemia associated with type 2 diabetes  mellitus (HCC)  E11.69 Comprehensive metabolic panel   Z61.0 AMB Referral to [facility]    3. Controlled type 2 diabetes mellitus without complication, without long-term current use of insulin (HCC)  E11.9 Hemoglobin A1c    4. Coronary artery disease involving native coronary artery of native heart without angina pectoris  I25.10 AMB Referral to [facility]    5. Screening for prostate cancer  Z12.5 PSA      No orders of the defined types were placed in this encounter.   Return precautions advised.  Tana Conch, MD

## 2023-11-02 NOTE — Patient Instructions (Addendum)
Call cardiology if you do not hear about lipid clinic within a week  Please stop by lab before you go If you have mychart- we will send your results within 3 business days of [place] receiving them.  If you do not have mychart- we will call you about results within 5 business days of [place] receiving them.  *please also note that you will see labs on mychart as soon as they post. I will later go in and write notes on them- will say "notes from Dr. Durene Cal"   Recommended follow up: Return in about 6 months (around 05/02/2024) for physical or sooner if needed.Schedule b4 you leave.

## 2023-11-03 ENCOUNTER — Telehealth: Payer: Self-pay | Admitting: Internal Medicine

## 2023-11-03 NOTE — Telephone Encounter (Signed)
Patient wants to schedule a NP AMB REFERRAL TO [facility]. Please call [phone] to follow-up.

## 2023-11-06 ENCOUNTER — Other Ambulatory Visit (HOSPITAL_COMMUNITY): Payer: Self-pay

## 2023-11-06 ENCOUNTER — Telehealth: Payer: Self-pay | Admitting: Pharmacist

## 2023-11-06 ENCOUNTER — Telehealth: Payer: Self-pay | Admitting: Family Medicine

## 2023-11-06 ENCOUNTER — Ambulatory Visit: Payer: 59 | Attending: Cardiovascular Disease | Admitting: Student

## 2023-11-06 ENCOUNTER — Encounter: Payer: Self-pay | Admitting: Student

## 2023-11-06 DIAGNOSIS — E1169 Type 2 diabetes mellitus with other specified complication: Secondary | ICD-10-CM

## 2023-11-06 DIAGNOSIS — E785 Hyperlipidemia, unspecified: Secondary | ICD-10-CM

## 2023-11-06 MED ORDER — EZETIMIBE 10 MG PO TABS: 10.0000 mg | ORAL_TABLET | Freq: Every day | ORAL | 3 refills | Status: DC

## 2023-11-06 MED ORDER — ICOSAPENT ETHYL 1 G PO CAPS: 2.0000 g | ORAL_CAPSULE | Freq: Two times a day (BID) | ORAL | 3 refills | Status: DC

## 2023-11-06 NOTE — Assessment & Plan Note (Signed)
Assessment:  LDL goal: <55 mg/dl TG <191 TG and LDLc above goal  Intolerance to statins - able to tolerate only lovastatin 10 mg once a week dose  Discussed next potential options to lower TG and LDLc  ( ezetimibe, PCSK-9 inhibitors); cost, dosing efficacy, side effects  Exercise: none diet : needs improvement   Plan: Continue taking current medications (Lovastatin 10 mg once a week) Start taking ezetimibe 10 mg daily and Vascepa 2 gm twice daily  Reiterated significance of healthy diet and regular exercise  Will apply for PA for PCSK9i; will inform patient upon approval  LFT and Lipid lab due in 2-3 months after therapy optimization

## 2023-11-06 NOTE — Telephone Encounter (Signed)
Pharmacy Patient Advocate Encounter   Received notification from Physician's Office that prior authorization for REPATHA is required/requested.   Insurance verification completed.   The patient is insured through [facility] .   Per test claim: The current 28 day co-pay is, $18.  No PA needed at this time. This test claim was processed through [facility]- copay amounts may vary at other pharmacies due to pharmacy/plan contracts, or as the patient moves through the different stages of their insurance plan.

## 2023-11-06 NOTE — Addendum Note (Signed)
Addended by: Tylene Fantasia on: 11/06/2023 10:02 AM   Modules accepted: Orders, Level of Service

## 2023-11-06 NOTE — Telephone Encounter (Signed)
Pt called, last seen 12/4. Has followed instructions and taken meds, but does not feel better. Thinks pain is moving a bit and may be involving his sciatica nerve.  Was unsure if I should schedule/overbook or if it was time for an MRI order.

## 2023-11-06 NOTE — Patient Instructions (Addendum)
  Medication changes: Start taking ezetimibe 10 mg daily and Vascepa 2 gm twice daily  We will start the process to get PCSK9i (Repatha or Praluent)  covered by your insurance.  Once the prior authorization is complete, we will call you to let you know and confirm pharmacy information.   Praluent is a cholesterol medication that improved your body's ability to get rid of "bad cholesterol" known as LDL. It can lower your LDL up to 60%. It is an injection that is given under the skin every 2 weeks. The most common side effects of Praluent include runny nose, symptoms of the common cold, rarely flu or flu-like symptoms, back/muscle pain in about 3-4% of the patients, and redness, pain, or bruising at the injection site.    Repatha is a cholesterol medication that improved your body's ability to get rid of "bad cholesterol" known as LDL. It can lower your LDL up to 60%! It is an injection that is given under the skin every 2 weeks. The medication often requires a prior authorization from your insurance company.  The most common side effects of Repatha include runny nose, symptoms of the common cold, rarely flu or flu-like symptoms, back/muscle pain in about 3-4% of the patients, and redness, pain, or bruising at the injection site.   Lab orders: We want to repeat labs after 2-3 months.  We will send you a lab order to remind you once we get closer to that time.

## 2023-11-06 NOTE — Telephone Encounter (Signed)
 PCSK9i PA request sent to the pool

## 2023-11-06 NOTE — Progress Notes (Signed)
Patient ID: Alex [name]                 DOB: 08/05/1961                    MRN: [id number]      HPI: Alex [name] is a 62 y.o. male patient referred to lipid clinic by Dr.Mallipeddi. PMH is significant for T2DM, HTN, HLD, CAD, drug induced myopathy,Coronary calcium score of 414(82 percentile).   Patient presented today for lipid clinic. Reports he has tried multiple statins in the past it affects his mobility. He does not recall all statins name but he currently taking lovastatin 10 mg once a week. He is only able to tolerate once week dose any other doses gives him severe muscle cramps.  He retired almost 6 months ago. Gained weight 10 lbs in last 6 months  and was on vacation so eat wrong thing. Was working with dietitian in the past thinking to going back to her. Has lots of gym equipments at home will start following some sort of exercise regimen with his wife.  His father died in his 62's from MI.  Reviewed options for lowering LDL cholesterol and TG , including Vascepa,ezetimibe, PCSK-9 inhibitors.  Discussed mechanisms of action, dosing, side effects and potential decreases in LDL cholesterol.  Also reviewed cost information and potential options for patient assistance.   Current Medications: lovastatin 10 mg once a week Intolerances: Atorvastatin, rosuvastatin - does not re recall doses  Risk Factors: T2DM, HTN, HLD, CAD, drug induced myopathy,Coronary calcium score of 414(82 percentile) LDL goal: <55 Last lab : 05/03/2023 TG 220, direct LDLc 154 TC 231  Last A1c 4 days ago 6.7    Exercise: none  Family History:  Relation Problem Comments  Mother (Deceased at age 55) COPD   Emphysema     Father (Deceased at age 25) Coronary artery disease   Heart attack   Heart disease heavy smoker, 47     Social History:  Alcohol: occasional - moderation 1-2 beers per week  Smoking: none   Labs: Lipid Panel     Component Value Date/Time   CHOL 231 (H) 05/03/2023 0847   TRIG 220.0  (H) 05/03/2023 0847   HDL 38.80 (L) 05/03/2023 0847   CHOLHDL 6 05/03/2023 0847   VLDL 44.0 (H) 05/03/2023 0847   LDLCALC 137 (H) 03/31/2022 0909   LDLDIRECT 154.0 05/03/2023 0847    Past Medical History:  Diagnosis Date   Allergy    flonase   Anxiety    OCC   Arthritis    COVID-19    Diabetes mellitus    diet controlled   GERD (gastroesophageal reflux disease)    past hx lost weight   Hemorrhoids    occ inflammation   Hx of adenomatous polyp of colon 04/28/2017   Mixed hyperlipidemia    Nonspecific elevation of levels of transaminase or lactic acid dehydrogenase (LDH)    Unspecified essential hypertension     Current Outpatient Medications on File Prior to Visit  Medication Sig Dispense Refill   aspirin EC 81 MG tablet Take 81 mg by mouth daily. Swallow whole.     clobetasol cream (TEMOVATE) 0.05 % Apply 1 application topically 2 (two) times daily. Eczema for up to 7-10 days 60 g 2   EPINEPHrine 0.3 mg/0.3 mL IJ SOAJ injection Inject 0.3 mLs (0.3 mg total) into the muscle as needed for anaphylaxis. 2 each 1   fluticasone (FLONASE) 50 MCG/ACT  nasal spray SHAKE LIQUID AND USE 2 SPRAYS IN EACH NOSTRIL DAILY 48 g 3   glucose blood (ACCU-CHEK AVIVA) test strip Check blood sugar daily as needed. 100 each 3   glucose blood test strip 1 each by Other route daily. Use as instructed 100 each 12   hydrochlorothiazide (HYDRODIURIL) 12.5 MG tablet TAKE 1 TABLET(12.5 MG) BY MOUTH DAILY 30 tablet 5   Lancet Devices (ACCU-CHEK SOFTCLIX) lancets 1 each by Other route daily. Use as instructed 1 each 11   lisinopril (ZESTRIL) 40 MG tablet TAKE 1 TABLET(40 MG) BY MOUTH DAILY 90 tablet 2   lovastatin (MEVACOR) 10 MG tablet Take 1 tablet (10 mg total) by mouth once a week. 13 tablet 3   meloxicam (MOBIC) 7.5 MG tablet TAKE 1 TABLET(7.5 MG) BY MOUTH DAILY 30 tablet 0   Multiple Vitamin (MULTIVITAMIN) tablet Take 1 tablet by mouth daily.     Spacer/Aero-Holding Chambers (AEROCHAMBER PLUS) inhaler  Use with inhaler 1 each 2   tiZANidine (ZANAFLEX) 4 MG tablet Take 1 tablet (4 mg total) by mouth at bedtime. 30 tablet 0   vitamin B-12 (CYANOCOBALAMIN) 500 MCG tablet Take 500 mcg by mouth daily.     No current facility-administered medications on file prior to visit.    Allergies  Allergen Reactions   Amlodipine     gingival hyperplasia     Assessment/Plan:  1. Hyperlipidemia -  Problem  Hyperlipidemia Associated With Type 2 Diabetes Mellitus (Hcc)   Current Medications: lovastatin 10 mg once a week - causing myalgia  Intolerances: Atorvastatin 40 mg daily -severe myalgia, lovastatin 10 mg daily -severe myalgia   Risk Factors: T2DM, HTN, HLD, CAD, drug induced myopathy,Coronary calcium score of 414(82 percentile) LDL goal: <55 Last lab : 05/03/2023 TG 220, direct LDLc 154 TC 231  Last A1c 4 days ago 6.7     Hyperlipidemia associated with type 2 diabetes mellitus (HCC) Assessment:  LDL goal: <55 mg/dl TG <086 TG and LDLc above goal  Intolerance to statins - able to tolerate only lovastatin 10 mg once a week dose  Discussed next potential options to lower TG and LDLc  ( ezetimibe, PCSK-9 inhibitors); cost, dosing efficacy, side effects  Exercise: none diet : needs improvement   Plan: Continue taking current medications (Lovastatin 10 mg once a week) Start taking ezetimibe 10 mg daily and Vascepa 2 gm twice daily  Reiterated significance of healthy diet and regular exercise  Will apply for PA for PCSK9i; will inform patient upon approval  LFT and Lipid lab due in 2-3 months after therapy optimization     Thank you,  Carmela Hurt, Pharm.D [facility] A Division of [facility] [address] [place], [place] [address]  Phone: [phone]; Fax: [phone]

## 2023-11-07 ENCOUNTER — Other Ambulatory Visit: Payer: Self-pay

## 2023-11-07 DIAGNOSIS — M545 Low back pain, unspecified: Secondary | ICD-10-CM

## 2023-11-07 LAB — LIPID PANEL
Chol/HDL Ratio: 6.8 {ratio} — ABNORMAL HIGH (ref 0.0–5.0)
Cholesterol, Total: 244 mg/dL — ABNORMAL HIGH (ref 100–199)
HDL: 36 mg/dL — ABNORMAL LOW (ref >39)
LDL Chol Calc (NIH): 169 mg/dL — ABNORMAL HIGH (ref 0–99)
Triglycerides: 208 mg/dL — ABNORMAL HIGH (ref 0–149)
VLDL Cholesterol Cal: 39 mg/dL (ref 5–40)

## 2023-11-08 MED ORDER — REPATHA SURECLICK 140 MG/ML ~~LOC~~ SOAJ: 140.0000 mg | SUBCUTANEOUS | 3 refills | Status: DC

## 2023-11-08 NOTE — Telephone Encounter (Signed)
Patient made aware and prescription sent to preferred pharmacy.

## 2023-11-08 NOTE — Addendum Note (Signed)
Addended by: Tylene Fantasia on: 11/08/2023 08:28 AM   Modules accepted: Orders

## 2023-11-09 ENCOUNTER — Ambulatory Visit
Admission: RE | Admit: 2023-11-09 | Discharge: 2023-11-09 | Disposition: A | Discharge status: Home / Self Care | Payer: 59 | Source: Ambulatory Visit | Attending: Family Medicine | Admitting: Family Medicine

## 2023-11-09 DIAGNOSIS — M545 Low back pain, unspecified: Secondary | ICD-10-CM

## 2023-11-26 ENCOUNTER — Other Ambulatory Visit: Payer: Self-pay | Admitting: Family Medicine

## 2023-11-27 ENCOUNTER — Other Ambulatory Visit: Payer: 59

## 2023-12-20 NOTE — Progress Notes (Unsigned)
[name] [address] [place] [address] Phone: [phone] Subjective:   [name], am serving as a scribe for Dr. Antoine Primas.  I'm seeing this patient by the request  of:  Shelva Majestic, MD  CC: Back and neck pain follow-up  [id number]  Alex [name] is a 63 y.o. male coming in with complaint of back and neck pain. OMT 11/01/2023. Also f/u for MRI results. Patient states wants to talk about MRI results. Still having the pain R side. Wanted to talk about the epidural option.  Medications patient has been prescribed:   Taking:    MRI lumbar 11/09/2023 IMPRESSION: 1. Multilevel degenerative changes. 2. L3-4 right lateral focal disc protrusion with right worse than left neural foraminal narrowing. 3. L5-S1 central focal disc protrusion with left worse than right mild neural foraminal narrowing. 4. No significant spinal stenosis.     Reviewed prior external information including notes and imaging from previsou exam, outside providers and external EMR if available.   As well as notes that were available from care everywhere and other healthcare systems.  Past medical history, social, surgical and family history all reviewed in electronic medical record.  No pertanent information unless stated regarding to the chief complaint.   Past Medical History:  Diagnosis Date   Allergy    flonase   Anxiety    OCC   Arthritis    COVID-19    Diabetes mellitus    diet controlled   GERD (gastroesophageal reflux disease)    past hx lost weight   Hemorrhoids    occ inflammation   Hx of adenomatous polyp of colon 04/28/2017   Mixed hyperlipidemia    Nonspecific elevation of levels of transaminase or lactic acid dehydrogenase (LDH)    Unspecified essential hypertension     Allergies  Allergen Reactions   Amlodipine     gingival hyperplasia      Review of Systems:  No headache, visual changes, nausea, vomiting, diarrhea,  constipation, dizziness, abdominal pain, skin rash, fevers, chills, night sweats, weight loss, swollen lymph nodes, body aches, joint swelling, chest pain, shortness of breath, mood changes. POSITIVE muscle aches  Objective  Blood pressure 122/84, pulse 60, height 5\' 6"  (1.676 m), weight 163 lb (73.9 kg), SpO2 96%.   General: No apparent distress alert and oriented x3 mood and affect normal, dressed appropriately.  HEENT: Pupils equal, extraocular movements intact  Respiratory: Patient's speak in full sentences and does not appear short of breath  Cardiovascular: No lower extremity edema, non tender, no erythema  Gait MSK:  Back does have some loss lordosis.  Tightness noted in the paraspinal musculature.  This seems to be more on the right sacroiliac joint  After verbal consent patient was prepped with alcohol swab and with a 21-gauge 2 inch needle injected into the sacroiliac joint.  Total of 1 cc of 0.5% Marcaine and 1 cc of Kenalog 40 mg/mL.  No blood loss.  Band-Aid placed.  Postinjection instructions given      Assessment and Plan:  SI (sacroiliac) joint dysfunction Discussed with patient that icing regimen and home exercises, which activities to do and which ones to avoid.  Increase activity slowly.  I still feel that there is a possibility for more of a lumbar radiculopathy and encourage patient to consider the epidural which we will order in case patient does not respond well in the next 2 weeks.  Patient is to continue with the hip abductor  strengthening and the other modalities including dry needling and chiropractic care starting again in 1 week follow-up with me again in 6 to 8 weeks otherwise.         The above documentation has been reviewed and is accurate and complete Judi Saa, DO         Note: This dictation was prepared with Dragon dictation along with smaller phrase technology. Any transcriptional errors that result from this process are unintentional.

## 2023-12-21 ENCOUNTER — Encounter: Payer: Self-pay | Admitting: Family Medicine

## 2023-12-21 ENCOUNTER — Ambulatory Visit: Payer: 59 | Admitting: Family Medicine

## 2023-12-21 VITALS — BP 122/84 | HR 60 | Ht 66.0 in | Wt 163.0 lb

## 2023-12-21 DIAGNOSIS — M545 Low back pain, unspecified: Secondary | ICD-10-CM

## 2023-12-21 DIAGNOSIS — M533 Sacrococcygeal disorders, not elsewhere classified: Secondary | ICD-10-CM | POA: Diagnosis not present

## 2023-12-21 NOTE — Assessment & Plan Note (Signed)
Discussed with patient that icing regimen and home exercises, which activities to do and which ones to avoid.  Increase activity slowly.  I still feel that there is a possibility for more of a lumbar radiculopathy and encourage patient to consider the epidural which we will order in case patient does not respond well in the next 2 weeks.  Patient is to continue with the hip abductor strengthening and the other modalities including dry needling and chiropractic care starting again in 1 week follow-up with me again in 6 to 8 weeks otherwise.

## 2023-12-21 NOTE — Patient Instructions (Addendum)
[facility] [phone]  If SI joint injections doesn't work in next 2 weeks lets do epidural  See you again in 2 months

## 2023-12-26 ENCOUNTER — Other Ambulatory Visit: Payer: Self-pay | Admitting: Family Medicine

## 2024-01-03 NOTE — Discharge Instructions (Signed)

## 2024-01-04 ENCOUNTER — Inpatient Hospital Stay
Admission: RE | Admit: 2024-01-04 | Discharge: 2024-01-04 | Disposition: A | Discharge status: Home / Self Care | Payer: 59 | Source: Ambulatory Visit | Attending: Family Medicine | Admitting: Family Medicine

## 2024-01-25 ENCOUNTER — Other Ambulatory Visit: Payer: Self-pay | Admitting: Family Medicine

## 2024-02-01 ENCOUNTER — Other Ambulatory Visit: Payer: Self-pay | Admitting: Cardiology

## 2024-02-12 ENCOUNTER — Other Ambulatory Visit: Payer: Self-pay | Admitting: Pharmacist

## 2024-02-12 ENCOUNTER — Telehealth: Payer: Self-pay | Admitting: Pharmacist

## 2024-02-12 DIAGNOSIS — E1169 Type 2 diabetes mellitus with other specified complication: Secondary | ICD-10-CM

## 2024-02-12 NOTE — Telephone Encounter (Signed)
 Call to remind pt for follow up lipid lab - Repatha started early Dec -2024. Pt will be going for lab on February 15, 2024

## 2024-02-16 ENCOUNTER — Encounter: Payer: Self-pay | Admitting: Cardiology

## 2024-02-16 LAB — LIPID PANEL
Chol/HDL Ratio: 3.1 ratio (ref 0.0–5.0)
Cholesterol, Total: 122 mg/dL (ref 100–199)
HDL: 39 mg/dL — ABNORMAL LOW (ref >39)
LDL Chol Calc (NIH): 57 mg/dL (ref 0–99)
Triglycerides: 154 mg/dL — ABNORMAL HIGH (ref 0–149)
VLDL Cholesterol Cal: 26 mg/dL (ref 5–40)

## 2024-02-19 ENCOUNTER — Other Ambulatory Visit: Payer: Self-pay

## 2024-02-19 ENCOUNTER — Encounter: Payer: Self-pay | Admitting: Family Medicine

## 2024-02-19 MED ORDER — CLOBETASOL PROPIONATE 0.05 % EX CREA: 1.0000 | TOPICAL_CREAM | Freq: Two times a day (BID) | CUTANEOUS | 2 refills | Status: DC

## 2024-02-19 MED ORDER — EPINEPHRINE 0.3 MG/0.3ML IJ SOAJ: 0.3000 mg | INTRAMUSCULAR | 1 refills | Status: AC | PRN

## 2024-02-21 NOTE — Progress Notes (Unsigned)
 [name] [address] [place] [address] Phone: [phone] Subjective:   Alex [name], am serving as a scribe for Dr. Antoine Primas.  I'm seeing this patient by the request  of:  Shelva Majestic, MD  CC: Back pain follow-up  [id number]  12/21/2023 Discussed with patient that icing regimen and home exercises, which activities to do and which ones to avoid. Increase activity slowly. I still feel that there is a possibility for more of a lumbar radiculopathy and encourage patient to consider the epidural which we will order in case patient does not respond well in the next 2 weeks. Patient is to continue with the hip abductor strengthening and the other modalities including dry needling and chiropractic care starting again in 1 week follow-up with me again in 6 to 8 weeks otherwise.   Update 02/22/2024 Alex [name] is a 63 y.o. male coming in with complaint of lumbar spine pain. Was suppose to get epidural. Patient states has been trying other things. Believes his pain is more muscle related. Everything is on the R side. Standing is okay, but as soon as he sits down is when he really starts to feel it. Feels like it knots up. Still cannot lay flat.       Past Medical History:  Diagnosis Date   Allergy    flonase   Anxiety    OCC   Arthritis    COVID-19    Diabetes mellitus    diet controlled   GERD (gastroesophageal reflux disease)    past hx lost weight   Hemorrhoids    occ inflammation   Hx of adenomatous polyp of colon 04/28/2017   Mixed hyperlipidemia    Nonspecific elevation of [name] of transaminase or lactic acid dehydrogenase (LDH)    Unspecified essential hypertension    Past Surgical History:  Procedure Laterality Date   APPENDECTOMY  1965   COLONOSCOPY     POLYPECTOMY     Social History   Socioeconomic History   Marital status: Married    Spouse name: Not on file   Number of children: Not on file   Years of  education: Not on file   Highest education level: 12th grade  Occupational History   Not on file  Tobacco Use   Smoking status: Never   Smokeless tobacco: Never  Vaping Use   Vaping status: Never Used  Substance and Sexual Activity   Alcohol use: Yes    Alcohol/week: 1.0 standard drink of alcohol    Types: 1 Standard drinks or equivalent per week    Comment: occ   Drug use: No   Sexual activity: Not on file  Other Topics Concern   Not on file  Social History Narrative   Married 2007. No kids. 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), guns, target shooting, travel   Social Drivers of Corporate investment banker Strain: Low Risk  (10/30/2023)   Overall Financial Resource Strain (CARDIA)    Difficulty of Paying Living Expenses: Not hard at all  Food Insecurity: No Food Insecurity (10/30/2023)   Hunger Vital Sign    Worried About Running Out of Food in the Last Year: Never  true    Ran Out of Food in the Last Year: Never true  Transportation Needs: No Transportation Needs (10/30/2023)   PRAPARE - Administrator, Civil Service (Medical): No    Lack of Transportation (Non-Medical): No  Physical Activity: Unknown (10/30/2023)   Exercise Vital Sign    Days of Exercise per Week: 0 days    Minutes of Exercise per Session: Not on file  Stress: No Stress Concern Present (10/30/2023)   [employer] of Occupational Health - Occupational Stress Questionnaire    Feeling of Stress : Not at all  Social Connections: Socially Integrated (10/30/2023)   Social Connection and Isolation Panel [NHANES]    Frequency of Communication with Friends and Family: More than three times a week    Frequency of Social Gatherings with Friends and Family: Once a week    Attends  Religious Services: More than 4 times per year    Active Member of [employer] or Organizations: Yes    Attends Engineer, structural: More than 4 times per year    Marital Status: Married   Allergies  Allergen Reactions   Amlodipine     gingival hyperplasia    Family History  Problem Relation Age of Onset   COPD Mother    Emphysema Mother    Heart disease Father        heavy smoker, 71   Coronary artery disease Father    Heart attack Father    Colon cancer Neg Hx    Colon polyps Neg Hx    Esophageal cancer Neg Hx    Rectal cancer Neg Hx    Stomach cancer Neg Hx      Current Outpatient Medications (Cardiovascular):    EPINEPHrine 0.3 mg/0.3 mL IJ SOAJ injection, Inject 0.3 mg into the muscle as needed for anaphylaxis.   Evolocumab (REPATHA SURECLICK) 140 MG/ML SOAJ, Inject 140 mg into the skin every 14 (fourteen) days.   ezetimibe (ZETIA) 10 MG tablet, Take 1 tablet (10 mg total) by mouth daily.   hydrochlorothiazide (HYDRODIURIL) 12.5 MG tablet, TAKE 1 TABLET(12.5 MG) BY MOUTH DAILY   icosapent Ethyl (VASCEPA) 1 g capsule, TAKE 2 CAPSULES(2 GRAMS) BY MOUTH TWICE DAILY   lisinopril (ZESTRIL) 40 MG tablet, TAKE 1 TABLET(40 MG) BY MOUTH DAILY   lovastatin (MEVACOR) 10 MG tablet, Take 1 tablet (10 mg total) by mouth once a week.  Current Outpatient Medications (Respiratory):    fluticasone (FLONASE) 50 MCG/ACT nasal spray, SHAKE LIQUID AND USE 2 SPRAYS IN EACH NOSTRIL DAILY  Current Outpatient Medications (Analgesics):    aspirin EC 81 MG tablet, Take 81 mg by mouth daily. Swallow whole.   meloxicam (MOBIC) 7.5 MG tablet, TAKE 1 TABLET(7.5 MG) BY MOUTH DAILY  Current Outpatient Medications (Hematological):    vitamin B-12 (CYANOCOBALAMIN) 500 MCG tablet, Take 500 mcg by mouth daily.  Current Outpatient Medications (Other):    DULoxetine (CYMBALTA) 20 MG capsule, Take 1 capsule (20 mg total) by mouth daily.   clobetasol cream (TEMOVATE) 0.05 %, Apply 1 Application  topically 2 (two) times daily. Eczema for up to 7-10 days   glucose blood (ACCU-CHEK AVIVA) test strip, Check blood sugar daily as needed.   glucose blood test strip, 1 each by Other route daily. Use as instructed   Lancet Devices (ACCU-CHEK SOFTCLIX) lancets, 1 each by Other route daily. Use as instructed   Multiple Vitamin (MULTIVITAMIN) tablet, Take 1 tablet by mouth daily.   Spacer/Aero-Holding Chambers (AEROCHAMBER PLUS) inhaler, Use with  inhaler   tiZANidine (ZANAFLEX) 4 MG tablet, TAKE 1 TABLET(4 MG) BY MOUTH AT BEDTIME   Reviewed prior external information including notes and imaging from  primary care provider As well as notes that were available from care everywhere and other healthcare systems.  Past medical history, social, surgical and family history all reviewed in electronic medical record.  No pertanent information unless stated regarding to the chief complaint.   Review of Systems:  No headache, visual changes, nausea, vomiting, diarrhea, constipation, dizziness, abdominal pain, skin rash, fevers, chills, night sweats, weight loss, swollen lymph nodes,  joint swelling, chest pain, shortness of breath, mood changes. POSITIVE muscle aches, body aches  Objective  Blood pressure 126/82, pulse 65, height 5\' 6"  (1.676 m), weight 161 lb (73 kg), SpO2 97%.   General: No apparent distress alert and oriented x3 mood and affect normal, dressed appropriately.  HEENT: Pupils equal, extraocular movements intact  Respiratory: Patient's speak in full sentences and does not appear short of breath  Cardiovascular: No lower extremity edema, non tender, no erythema  Tightness in the paraspinal musculature.  Patient does have relatively good range of motion except for right-sided rotation or left-sided sidebending of the lumbar spine.    Impression and Recommendations:    The above documentation has been reviewed and is accurate and complete Judi Saa, DO

## 2024-02-22 ENCOUNTER — Ambulatory Visit: Payer: 59 | Admitting: Family Medicine

## 2024-02-22 ENCOUNTER — Encounter: Payer: Self-pay | Admitting: Family Medicine

## 2024-02-22 VITALS — BP 126/82 | HR 65 | Ht 66.0 in | Wt 161.0 lb

## 2024-02-22 DIAGNOSIS — M545 Low back pain, unspecified: Secondary | ICD-10-CM | POA: Diagnosis not present

## 2024-02-22 DIAGNOSIS — G8929 Other chronic pain: Secondary | ICD-10-CM | POA: Diagnosis not present

## 2024-02-22 MED ORDER — DULOXETINE HCL 20 MG PO CPEP: 20.0000 mg | ORAL_CAPSULE | Freq: Every day | ORAL | 0 refills | Status: DC

## 2024-02-22 NOTE — Patient Instructions (Signed)
 Cymbalta 20mg  Epidural is always there if needed Send me a message in 2 weeks with an update See you again in 2 months

## 2024-02-22 NOTE — Assessment & Plan Note (Signed)
 Discussed with patient again at great length.  Do feel that an epidural could be warranted and the patient wants to hold on it at this time.  Discussed multiple different treatment options.  Patient has elected to try the Cymbalta.  Warned of potential side effects and will await any type of insert from pharmacology.  Follow-up with me again after starting for 4 to 6 weeks.  Continue to work on core strengthening, home exercises, which activities to do and which ones to avoid.  Follow-up as needed in 4 to 6 weeks.  Total time with patient and chart 32 minutes

## 2024-02-23 ENCOUNTER — Other Ambulatory Visit: Payer: Self-pay | Admitting: Family Medicine

## 2024-03-04 DIAGNOSIS — M47812 Spondylosis without myelopathy or radiculopathy, cervical region: Secondary | ICD-10-CM | POA: Diagnosis not present

## 2024-03-04 DIAGNOSIS — M9904 Segmental and somatic dysfunction of sacral region: Secondary | ICD-10-CM | POA: Diagnosis not present

## 2024-03-04 DIAGNOSIS — M9903 Segmental and somatic dysfunction of lumbar region: Secondary | ICD-10-CM | POA: Diagnosis not present

## 2024-03-04 DIAGNOSIS — S233XXA Sprain of ligaments of thoracic spine, initial encounter: Secondary | ICD-10-CM | POA: Diagnosis not present

## 2024-03-04 DIAGNOSIS — M9901 Segmental and somatic dysfunction of cervical region: Secondary | ICD-10-CM | POA: Diagnosis not present

## 2024-03-04 DIAGNOSIS — S338XXA Sprain of other parts of lumbar spine and pelvis, initial encounter: Secondary | ICD-10-CM | POA: Diagnosis not present

## 2024-03-08 ENCOUNTER — Other Ambulatory Visit: Payer: Self-pay

## 2024-03-08 ENCOUNTER — Telehealth: Payer: Self-pay | Admitting: Family Medicine

## 2024-03-08 MED ORDER — FLUTICASONE PROPIONATE 50 MCG/ACT NA SUSP: 1.0000 | Freq: Every day | NASAL | 3 refills | Status: AC

## 2024-03-08 MED ORDER — LOVASTATIN 10 MG PO TABS: 10.0000 mg | ORAL_TABLET | ORAL | 3 refills | Status: AC

## 2024-03-08 MED ORDER — HYDROCHLOROTHIAZIDE 12.5 MG PO TABS: 12.5000 mg | ORAL_TABLET | Freq: Every day | ORAL | 3 refills | Status: DC

## 2024-03-08 MED ORDER — LISINOPRIL 40 MG PO TABS: ORAL_TABLET | ORAL | 2 refills | Status: DC

## 2024-03-08 NOTE — Telephone Encounter (Signed)
 Pt has switched to [facility] and new pharmacy is [facility] in [place].  Requesting 90 day supply of Tizanidine and Meloxicam to [facility].  (He may have refills under old insurance at another pharmacy but is requesting a new RX to [facility])

## 2024-03-11 ENCOUNTER — Other Ambulatory Visit: Payer: Self-pay

## 2024-03-11 DIAGNOSIS — M9901 Segmental and somatic dysfunction of cervical region: Secondary | ICD-10-CM | POA: Diagnosis not present

## 2024-03-11 DIAGNOSIS — M9904 Segmental and somatic dysfunction of sacral region: Secondary | ICD-10-CM | POA: Diagnosis not present

## 2024-03-11 DIAGNOSIS — S233XXA Sprain of ligaments of thoracic spine, initial encounter: Secondary | ICD-10-CM | POA: Diagnosis not present

## 2024-03-11 DIAGNOSIS — S338XXA Sprain of other parts of lumbar spine and pelvis, initial encounter: Secondary | ICD-10-CM | POA: Diagnosis not present

## 2024-03-11 DIAGNOSIS — M47812 Spondylosis without myelopathy or radiculopathy, cervical region: Secondary | ICD-10-CM | POA: Diagnosis not present

## 2024-03-11 DIAGNOSIS — R072 Precordial pain: Secondary | ICD-10-CM

## 2024-03-11 DIAGNOSIS — E1169 Type 2 diabetes mellitus with other specified complication: Secondary | ICD-10-CM

## 2024-03-11 DIAGNOSIS — M9903 Segmental and somatic dysfunction of lumbar region: Secondary | ICD-10-CM | POA: Diagnosis not present

## 2024-03-11 MED ORDER — EZETIMIBE 10 MG PO TABS: 10.0000 mg | ORAL_TABLET | Freq: Every day | ORAL | 2 refills | Status: DC

## 2024-03-11 MED ORDER — REPATHA SURECLICK 140 MG/ML ~~LOC~~ SOAJ: 140.0000 mg | SUBCUTANEOUS | 1 refills | Status: DC

## 2024-03-11 MED ORDER — ICOSAPENT ETHYL 1 G PO CAPS: 2.0000 g | ORAL_CAPSULE | Freq: Two times a day (BID) | ORAL | 2 refills | Status: DC

## 2024-03-18 DIAGNOSIS — M9904 Segmental and somatic dysfunction of sacral region: Secondary | ICD-10-CM | POA: Diagnosis not present

## 2024-03-18 DIAGNOSIS — S338XXA Sprain of other parts of lumbar spine and pelvis, initial encounter: Secondary | ICD-10-CM | POA: Diagnosis not present

## 2024-03-18 DIAGNOSIS — M47812 Spondylosis without myelopathy or radiculopathy, cervical region: Secondary | ICD-10-CM | POA: Diagnosis not present

## 2024-03-18 DIAGNOSIS — M9903 Segmental and somatic dysfunction of lumbar region: Secondary | ICD-10-CM | POA: Diagnosis not present

## 2024-03-18 DIAGNOSIS — M9901 Segmental and somatic dysfunction of cervical region: Secondary | ICD-10-CM | POA: Diagnosis not present

## 2024-03-18 DIAGNOSIS — S233XXA Sprain of ligaments of thoracic spine, initial encounter: Secondary | ICD-10-CM | POA: Diagnosis not present

## 2024-03-19 ENCOUNTER — Telehealth: Payer: Self-pay | Admitting: Cardiology

## 2024-03-19 NOTE — Telephone Encounter (Signed)
 Lokesh with [employer] called in stating pt needs additional clinical information needed with PA for Repatha . Please advise.

## 2024-03-20 ENCOUNTER — Other Ambulatory Visit (HOSPITAL_COMMUNITY): Payer: Self-pay

## 2024-03-20 ENCOUNTER — Telehealth: Payer: Self-pay | Admitting: Pharmacy Technician

## 2024-03-20 NOTE — Telephone Encounter (Signed)
 Pharmacy Patient Advocate Encounter   Received notification from Pt Calls Messages that prior authorization for repatha  is required/requested.   Insurance verification completed.   The patient is insured through [facility] [employer] .   Per test claim: PA required; PA submitted to above mentioned insurance via Phone Key/confirmation #/EOC phone  Status is pending

## 2024-03-22 ENCOUNTER — Other Ambulatory Visit (HOSPITAL_COMMUNITY): Payer: Self-pay

## 2024-03-25 ENCOUNTER — Other Ambulatory Visit (HOSPITAL_COMMUNITY): Payer: Self-pay

## 2024-03-25 NOTE — Telephone Encounter (Signed)
 Called and Pending as of 03/25/24

## 2024-03-27 NOTE — Telephone Encounter (Signed)
 Pharmacy Patient Advocate Encounter  Received notification from [facility] [employer] that Prior Authorization for repatha  has been APPROVED from 03/26/24 to 09/19/24. Spoke to pharmacy to process.Copay is $just got for 47.00 on 03/12/24 per [facility].

## 2024-04-15 ENCOUNTER — Other Ambulatory Visit: Payer: Self-pay

## 2024-04-15 ENCOUNTER — Telehealth: Payer: Self-pay | Admitting: Family Medicine

## 2024-04-15 MED ORDER — TIZANIDINE HCL 4 MG PO TABS: 4.0000 mg | ORAL_TABLET | Freq: Every day | ORAL | 0 refills | Status: DC

## 2024-04-15 MED ORDER — MELOXICAM 7.5 MG PO TABS: 7.5000 mg | ORAL_TABLET | Freq: Every day | ORAL | 0 refills | Status: DC

## 2024-04-15 NOTE — Telephone Encounter (Signed)
 Patient called requesting a refill on Meloxicam  7.5mg  and Tizanidine  4mg .  *he is currently out of town*  Pharmacy: [facility] [address], [place]

## 2024-04-29 NOTE — Progress Notes (Signed)
 [facility] [address] [place] [address] Phone: [phone] Subjective:   IBryan Caprio, am serving as a scribe for Dr. Ronnell Coins.  I'm seeing this patient by the request  of:  Almira Jaeger, MD  CC:   [id number]  02/22/2024 Discussed with patient again at great length.  Do feel that an epidural could be warranted and the patient wants to hold on it at this time.  Discussed multiple different treatment options.  Patient has elected to try the Cymbalta .  Warned of potential side effects and will await any type of insert from pharmacology.  Follow-up with me again after starting for 4 to 6 weeks.  Continue to work on core strengthening, home exercises, which activities to do and which ones to avoid.  Follow-up as needed in 4 to 6 weeks.  Total time with patient and chart 32 minutes    Update 04/30/2024 Alex [name] is a 63 y.o. male coming in with complaint of lower back pain. Patient states has been doing fairly well. Has been doing some dry needling and that is helping. Was able to go to beach and have very minimal pain. Wasn't able to take cymbalta , made him wake during the night. When he took during the day, made his chest ache.      Past Medical History:  Diagnosis Date   Allergy    flonase    Anxiety    OCC   Arthritis    COVID-19    Diabetes mellitus    diet controlled   GERD (gastroesophageal reflux disease)    past hx lost weight   Hemorrhoids    occ inflammation   Hx of adenomatous polyp of colon 04/28/2017   Mixed hyperlipidemia    Nonspecific elevation of levels of transaminase or lactic acid dehydrogenase (LDH)    Unspecified essential hypertension    Past Surgical History:  Procedure Laterality Date   APPENDECTOMY  1965   COLONOSCOPY     POLYPECTOMY     Social History   Socioeconomic History   Marital status: Married    Spouse name: Not on file   Number of children: Not on file   Years of education:  Not on file   Highest education level: 12th grade  Occupational History   Not on file  Tobacco Use   Smoking status: Never   Smokeless tobacco: Never  Vaping Use   Vaping status: Never Used  Substance and Sexual Activity   Alcohol use: Yes    Alcohol/week: 1.0 standard drink of alcohol    Types: 1 Standard drinks or equivalent per week    Comment: occ   Drug use: No   Sexual activity: Not on file  Other Topics Concern   Not on file  Social History Narrative   Married 2007. No kids. 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), guns, target shooting, travel   Social Drivers of Health   Financial Resource Strain: Low Risk  (10/30/2023)   Overall Financial Resource Strain (CARDIA)    Difficulty of Paying Living Expenses: Not hard at all  Food Insecurity: No Food Insecurity (10/30/2023)   Hunger Vital Sign    Worried About Running Out  of Food in the Last Year: Never true    Ran Out of Food in the Last Year: Never true  Transportation Needs: No Transportation Needs (10/30/2023)   PRAPARE - Administrator, Civil Service (Medical): No    Lack of Transportation (Non-Medical): No  Physical Activity: Unknown (10/30/2023)   Exercise Vital Sign    Days of Exercise per Week: 0 days    Minutes of Exercise per Session: Not on file  Stress: No Stress Concern Present (10/30/2023)   [employer] of Occupational Health - Occupational Stress Questionnaire    Feeling of Stress : Not at all  Social Connections: Socially Integrated (10/30/2023)   Social Connection and Isolation Panel [NHANES]    Frequency of Communication with Friends and Family: More than three times a week    Frequency of Social Gatherings with Friends and Family: Once a week    Attends Religious  Services: More than 4 times per year    Active Member of [employer] or Organizations: Yes    Attends Engineer, structural: More than 4 times per year    Marital Status: Married   Allergies  Allergen Reactions   Amlodipine      gingival hyperplasia    Family History  Problem Relation Age of Onset   COPD Mother    Emphysema Mother    Heart disease Father        heavy smoker, 50   Coronary artery disease Father    Heart attack Father    Colon cancer Neg Hx    Colon polyps Neg Hx    Esophageal cancer Neg Hx    Rectal cancer Neg Hx    Stomach cancer Neg Hx      Current Outpatient Medications (Cardiovascular):    EPINEPHrine  0.3 mg/0.3 mL IJ SOAJ injection, Inject 0.3 mg into the muscle as needed for anaphylaxis.   Evolocumab  (REPATHA  SURECLICK) 140 MG/ML SOAJ, Inject 140 mg into the skin every 14 (fourteen) days.   ezetimibe  (ZETIA ) 10 MG tablet, Take 1 tablet (10 mg total) by mouth daily.   hydrochlorothiazide  (HYDRODIURIL ) 12.5 MG tablet, Take 1 tablet (12.5 mg total) by mouth daily.   icosapent  Ethyl (VASCEPA ) 1 g capsule, Take 2 capsules (2 g total) by mouth 2 (two) times daily.   lisinopril  (ZESTRIL ) 40 MG tablet, TAKE 1 TABLET(40 MG) BY MOUTH DAILY   lovastatin  (MEVACOR ) 10 MG tablet, Take 1 tablet (10 mg total) by mouth once a week.  Current Outpatient Medications (Respiratory):    fluticasone  (FLONASE ) 50 MCG/ACT nasal spray, Place 1 spray into both nostrils daily.  Current Outpatient Medications (Analgesics):    aspirin EC 81 MG tablet, Take 81 mg by mouth daily. Swallow whole.   meloxicam  (MOBIC ) 7.5 MG tablet, Take 1 tablet (7.5 mg total) by mouth daily.  Current Outpatient Medications (Hematological):    vitamin B-12 (CYANOCOBALAMIN) 500 MCG tablet, Take 500 mcg by mouth daily.  Current Outpatient Medications (Other):    clobetasol  cream (TEMOVATE ) 0.05 %, Apply 1 Application topically 2 (two) times daily. Eczema for up to 7-10 days   DULoxetine  (CYMBALTA ) 20  MG capsule, Take 1 capsule (20 mg total) by mouth daily.   glucose blood (ACCU-CHEK AVIVA) test strip, Check blood sugar daily as needed.   glucose blood test strip, 1 each by Other route daily. Use as instructed   Lancet Devices (ACCU-CHEK SOFTCLIX) lancets, 1 each by Other route daily. Use as instructed   Multiple Vitamin (MULTIVITAMIN) tablet, Take 1 tablet  by mouth daily.   Spacer/Aero-Holding Chambers (AEROCHAMBER PLUS) inhaler, Use with inhaler   tiZANidine  (ZANAFLEX ) 4 MG tablet, Take 1 tablet (4 mg total) by mouth at bedtime.   Reviewed prior external information including notes and imaging from  primary care provider As well as notes that were available from care everywhere and other healthcare systems.  Past medical history, social, surgical and family history all reviewed in electronic medical record.  No pertanent information unless stated regarding to the chief complaint.   Review of Systems:  No headache, visual changes, nausea, vomiting, diarrhea, constipation, dizziness, abdominal pain, skin rash, fevers, chills, night sweats, weight loss, swollen lymph nodes, body aches, joint swelling, chest pain, shortness of breath, mood changes. POSITIVE muscle aches  Objective  Blood pressure 128/88, pulse (!) 59, height 5\' 6"  (1.676 m), weight 163 lb (73.9 kg), SpO2 97%.   General: No apparent distress alert and oriented x3 mood and affect normal, dressed appropriately.  HEENT: Pupils equal, extraocular movements intact  Respiratory: Patient's speak in full sentences and does not appear short of breath  Cardiovascular: No lower extremity edema, non tender, no erythema   Able to get up from a sitting position better.  Still has tightness with straight leg test on the right side compared to the left.  Mild radicular symptoms.  Tightness with FABER still noted on the right compared to the left neurovascularly intact distally   Impression and Recommendations:     The above  documentation has been reviewed and is accurate and complete Alex Grandstaff M Titan Karner, DO

## 2024-04-30 ENCOUNTER — Ambulatory Visit: Admitting: Family Medicine

## 2024-04-30 VITALS — BP 128/88 | HR 59 | Ht 66.0 in | Wt 163.0 lb

## 2024-04-30 DIAGNOSIS — G8929 Other chronic pain: Secondary | ICD-10-CM | POA: Diagnosis not present

## 2024-04-30 DIAGNOSIS — M545 Low back pain, unspecified: Secondary | ICD-10-CM | POA: Diagnosis not present

## 2024-04-30 MED ORDER — MELOXICAM 7.5 MG PO TABS: 7.5000 mg | ORAL_TABLET | Freq: Every day | ORAL | 0 refills | Status: DC

## 2024-04-30 MED ORDER — TIZANIDINE HCL 4 MG PO TABS: 4.0000 mg | ORAL_TABLET | Freq: Every day | ORAL | 0 refills | Status: DC

## 2024-04-30 NOTE — Assessment & Plan Note (Signed)
 MRI does show some lumbar radiculopathy that is consistent with his symptoms.  Shows that the nerve root is being impacted.  Unable to tolerate the Cymbalta , held on the other type of osteopathic manipulation at the moment.  Doing well with dry needling and other conservative therapy.  I have refilled the meloxicam  15 mg daily as well as the Zanaflex  4 mg.  Follow-up with me again in 2 to 3 months.  Worsening pain will consider the possibility of the epidural at the L5-S1 area

## 2024-04-30 NOTE — Patient Instructions (Addendum)
 Refilled meloxicam  and zanaflex  See you again in 3 months

## 2024-05-07 ENCOUNTER — Encounter: Payer: Self-pay | Admitting: Family Medicine

## 2024-05-07 ENCOUNTER — Ambulatory Visit (INDEPENDENT_AMBULATORY_CARE_PROVIDER_SITE_OTHER): Payer: 59 | Admitting: Family Medicine

## 2024-05-07 VITALS — BP 120/70 | HR 58 | Temp 97.7°F | Ht 66.0 in | Wt 161.0 lb

## 2024-05-07 DIAGNOSIS — Z0000 Encounter for general adult medical examination without abnormal findings: Secondary | ICD-10-CM | POA: Diagnosis not present

## 2024-05-07 DIAGNOSIS — I2510 Atherosclerotic heart disease of native coronary artery without angina pectoris: Secondary | ICD-10-CM

## 2024-05-07 DIAGNOSIS — E785 Hyperlipidemia, unspecified: Secondary | ICD-10-CM | POA: Diagnosis not present

## 2024-05-07 DIAGNOSIS — I10 Essential (primary) hypertension: Secondary | ICD-10-CM | POA: Diagnosis not present

## 2024-05-07 DIAGNOSIS — Z125 Encounter for screening for malignant neoplasm of prostate: Secondary | ICD-10-CM

## 2024-05-07 DIAGNOSIS — G720 Drug-induced myopathy: Secondary | ICD-10-CM

## 2024-05-07 DIAGNOSIS — Z23 Encounter for immunization: Secondary | ICD-10-CM | POA: Diagnosis not present

## 2024-05-07 DIAGNOSIS — E1169 Type 2 diabetes mellitus with other specified complication: Secondary | ICD-10-CM

## 2024-05-07 DIAGNOSIS — Z1283 Encounter for screening for malignant neoplasm of skin: Secondary | ICD-10-CM

## 2024-05-07 DIAGNOSIS — E119 Type 2 diabetes mellitus without complications: Secondary | ICD-10-CM

## 2024-05-07 LAB — COMPREHENSIVE METABOLIC PANEL WITH GFR
ALT: 26 U/L (ref 0–53)
AST: 26 U/L (ref 0–37)
Albumin: 5.1 g/dL (ref 3.5–5.2)
Alkaline Phosphatase: 59 U/L (ref 39–117)
BUN: 19 mg/dL (ref 6–23)
CO2: 29 meq/L (ref 19–32)
Calcium: 10.2 mg/dL (ref 8.4–10.5)
Chloride: 100 meq/L (ref 96–112)
Creatinine, Ser: 0.83 mg/dL (ref 0.40–1.50)
GFR: 92.08 mL/min (ref >60.00)
Glucose, Bld: 100 mg/dL — ABNORMAL HIGH (ref 70–99)
Potassium: 4.3 meq/L (ref 3.5–5.1)
Sodium: 137 meq/L (ref 135–145)
Total Bilirubin: 0.7 mg/dL (ref 0.2–1.2)
Total Protein: 7.8 g/dL (ref 6.0–8.3)

## 2024-05-07 LAB — CBC WITH DIFFERENTIAL/PLATELET
Basophils Absolute: 0 10*3/uL (ref 0.0–0.1)
Basophils Relative: 0.6 % (ref 0.0–3.0)
Eosinophils Absolute: 0.2 10*3/uL (ref 0.0–0.7)
Eosinophils Relative: 2.2 % (ref 0.0–5.0)
HCT: 42.4 % (ref 39.0–52.0)
Hemoglobin: 14.4 g/dL (ref 13.0–17.0)
Lymphocytes Relative: 22.6 % (ref 12.0–46.0)
Lymphs Abs: 1.9 10*3/uL (ref 0.7–4.0)
MCHC: 34 g/dL (ref 30.0–36.0)
MCV: 88 fl (ref 78.0–100.0)
Monocytes Absolute: 0.6 10*3/uL (ref 0.1–1.0)
Monocytes Relative: 7.4 % (ref 3.0–12.0)
Neutro Abs: 5.5 10*3/uL (ref 1.4–7.7)
Neutrophils Relative %: 67.2 % (ref 43.0–77.0)
Platelets: 316 10*3/uL (ref 150.0–400.0)
RBC: 4.81 Mil/uL (ref 4.22–5.81)
RDW: 13 % (ref 11.5–15.5)
WBC: 8.2 10*3/uL (ref 4.0–10.5)

## 2024-05-07 LAB — MICROALBUMIN / CREATININE URINE RATIO
Creatinine,U: 44.4 mg/dL
Microalb Creat Ratio: UNDETERMINED mg/g (ref 0.0–30.0)
Microalb, Ur: <0.7 mg/dL

## 2024-05-07 LAB — HEMOGLOBIN A1C: Hgb A1c MFr Bld: 6.2 % (ref 4.6–6.5)

## 2024-05-07 NOTE — Progress Notes (Signed)
 Phone: [phone]   Subjective:  Patient presents today for their annual physical. Chief complaint-noted.   See problem oriented charting- ROS- full  review of systems was completed and negative  Per full ROS sheet completed by patient except for topics noted under acute/chronic concerns  The following were reviewed and entered/updated in epic: Past Medical History:  Diagnosis Date   Allergy    flonase    Anxiety    OCC   Arthritis    COVID-19    Diabetes mellitus    diet controlled   GERD (gastroesophageal reflux disease)    past hx lost weight   Hemorrhoids    occ inflammation   Hx of adenomatous polyp of colon 04/28/2017   Mixed hyperlipidemia    Nonspecific elevation of levels of transaminase or lactic acid dehydrogenase (LDH)    Unspecified essential hypertension    Patient Active Problem List   Diagnosis Date Noted   nonobstructive CAD (coronary artery disease) 11/02/2023    Priority: High   Diabetes mellitus type II, controlled (HCC) 11/11/2010    Priority: High   BPH associated with nocturia 09/14/2021    Priority: Medium    Atherosclerosis of aorta (HCC) 10/15/2020    Priority: Medium    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   Low back pain 11/04/2022    Priority: 1.   Plantar fasciitis, bilateral 11/04/2022    Priority: 1.   Piriformis syndrome of right side 08/19/2019    Priority: 1.   SI (sacroiliac) joint dysfunction 12/21/2023   Atypical chest pain 05/03/2023   Past Surgical History:  Procedure Laterality Date   APPENDECTOMY  1965    COLONOSCOPY     POLYPECTOMY      Family History  Problem Relation Age of Onset   COPD Mother    Emphysema Mother    Heart disease Father        heavy smoker, 73   Coronary artery disease Father    Heart attack Father    Colon cancer Neg Hx    Colon polyps Neg Hx    Esophageal cancer Neg Hx    Rectal cancer Neg Hx    Stomach cancer Neg Hx     Medications- reviewed and updated Current Outpatient Medications  Medication Sig Dispense Refill   aspirin EC 81 MG tablet Take 81 mg by mouth daily. Swallow whole.     clobetasol  cream (TEMOVATE ) 0.05 % Apply 1 Application topically 2 (two) times daily. Eczema for up to 7-10 days 60 g 2   EPINEPHrine  0.3 mg/0.3 mL IJ SOAJ injection Inject 0.3 mg into the muscle as needed for anaphylaxis. 2 each 1   Evolocumab  (REPATHA  SURECLICK) 140 MG/ML SOAJ Inject 140 mg into the skin every 14 (fourteen) days. 6 mL 1   ezetimibe  (ZETIA ) 10 MG tablet Take 1 tablet (10 mg total) by mouth daily. 90 tablet 2   fluticasone  (FLONASE ) 50 MCG/ACT nasal spray Place 1 spray into both nostrils daily. 48 g 3   glucose blood (ACCU-CHEK AVIVA) test strip  Check blood sugar daily as needed. 100 each 3   glucose blood test strip 1 each by Other route daily. Use as instructed 100 each 12   hydrochlorothiazide  (HYDRODIURIL ) 12.5 MG tablet Take 1 tablet (12.5 mg total) by mouth daily. 90 tablet 3   icosapent  Ethyl (VASCEPA ) 1 g capsule Take 2 capsules (2 g total) by mouth 2 (two) times daily. 360 capsule 2   Lancet Devices (ACCU-CHEK SOFTCLIX) lancets 1 each by Other route daily. Use as instructed 1 each 11   lisinopril  (ZESTRIL ) 40 MG tablet TAKE 1 TABLET(40 MG) BY MOUTH DAILY 90 tablet 2   lovastatin  (MEVACOR ) 10 MG tablet Take 1 tablet (10 mg total) by mouth once a week. 13 tablet 3   meloxicam  (MOBIC ) 7.5 MG tablet Take 1 tablet (7.5 mg total) by mouth daily. 90 tablet 0   Multiple Vitamin (MULTIVITAMIN) tablet Take 1 tablet by mouth daily.     tiZANidine  (ZANAFLEX ) 4 MG  tablet Take 1 tablet (4 mg total) by mouth at bedtime. 90 tablet 0   vitamin B-12 (CYANOCOBALAMIN) 500 MCG tablet Take 500 mcg by mouth daily.     No current facility-administered medications for this visit.    Allergies-reviewed and updated Allergies  Allergen Reactions   Amlodipine      gingival hyperplasia     Social History   Social History Narrative   Married 2007. No kids. 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 Oropharynx normal Neck: no thyromegaly CV: RRR no murmurs rubs or gallops Lungs: CTAB no crackles, wheeze, rhonchi Abdomen: soft/nontender/nondistended/normal bowel sounds. No rebound or guarding.  Ext: no edema Skin: warm, dry Neuro: grossly normal, moves all extremities, PERRLA  Diabetic foot exam was performed with the following findings:   No deformities, ulcerations, or other skin breakdown Normal sensation of 10g monofilament Intact posterior tibialis and dorsalis pedis pulses Some darkening at base of 5th toenail on right- may have had trauma reports        Assessment and Plan  63 y.o. male presenting for annual physical.  Health Maintenance counseling: 1. Anticipatory guidance: Patient counseled regarding regular dental exams - needs to update dentist and get back on q6 months, eye exams -yearly,  avoiding smoking and second hand smoke , limiting alcohol to 2 beverages per day - slightly more on vacation but most weeks maybe 1 or less, no illicit drugs .   2. Risk factor reduction:   Advised patient of need for regular exercise and diet rich and fruits and vegetables to reduce risk of heart attack and stroke.  Exercise- trying to stay active in the shop. Was doing treadmill but back has been irritated so being cautious- wants to restart as back improves  Diet/weight management-has tried to improve diet but did have recent beach trip- mainly water- tea with stevia as alternate. Got as low as 156. Still down 2 lbs from  last year Wt Readings from Last 3 Encounters:  05/07/24 161 lb (73 kg)  04/30/24 163 lb (73.9 kg)  02/22/24 161 lb (73 kg)  3. Immunizations/screenings/ancillary studies -- Prevnar 20 today, needs to schedule diabetic eye exam   Immunization History  Administered Date(s) Administered   Influenza, Seasonal, Injecte, Preservative Fre 08/30/2023   Influenza,inj,Quad PF,6+ Mos 11/01/2013, 01/01/2019, 09/25/2020, 09/14/2021, 10/03/2022   Moderna Sars-Covid-2 Vaccination 02/06/2020, 03/07/2020   PNEUMOCOCCAL CONJUGATE-20 05/07/2024   Pneumococcal Conjugate-13 11/01/2013   Pneumococcal Polysaccharide-23 08/01/2018   Td 11/28/2006   Tdap 03/03/2017   Zoster Recombinant(Shingrix ) 01/01/2019, 04/04/2019   4. Prostate cancer screening-  low risk prior trend overall but did bump from 2023 value of 1.4 for some monitoring closely- update PSA  today  Lab Results  Component Value Date   PSA 1.97 11/02/2023   PSA 1.87 06/08/2023   PSA 2.02 05/03/2023   5. Colon cancer screening - history of colon polyp - with Dr. Gessner-07/04/22 - Diminutive adenoma recall 7 years 2030 6. Skin cancer screening- prior Dr. Steen Eden- refer to Dr. Myrtie Atkinson. advised regular sunscreen use. Denies worrisome, changing, or new skin lesions. Tries to wear hat 7. Smoking associated screening (lung cancer screening, AAA screen 65-75, UA)- never smoker 8. STD screening - only active with wife   Status of chronic or acute concerns   #ongoing back issues- seeing chiropractor in [place] plus Dr. Felipe Horton  -  dry needling has been about the most helpful. Has tried simwave- has tried laser. Flared up around February- threw him off his walking -can radiate into right hip with laying down- medications helpful from Dr. Felipe Horton - tizanidine  -mobic  helpful- discussed bleeding risks and hear trisks  #Diabetes mellitus-with hypertension and hyperlipidemia S: Medication: none,Patient has been diet and exercise controlled. Lab Results  Component Value Date   HGBA1C 6.7 (H) 11/02/2023   HGBA1C 6.0 05/03/2023   HGBA1C 5.5 10/03/2022  A/P: has tried to work on diet with a1c increasing- update today- still not on meds   #CAD nonobstructive- sees Dr. Renna Cary  #Hyperlipidemia associated with diabetes #aortic atherosclerosis #statin myopathy S: medication: tolerating lovastatin  10 mg once a week and Repatha  140 mg every 2 weeks as well as Vascepa  2 g twice daily, aspirin 81 mg  -some easy bruising on these meds -Patient has been intolerant to statin due to myalgias in the past even atorvastatin  40 mg once a week.   - cardiology has referred to lipid clinic in 2024 - they have helped get him on Repatha  and vascepa  -no chest pain or shortness of breath  Lab Results  Component Value Date   CHOL 122 02/15/2024   HDL 39 (L) 02/15/2024   LDLCALC 57 02/15/2024   LDLDIRECT 154.0 05/03/2023   TRIG 154 (H) 02/15/2024   CHOLHDL 3.1 02/15/2024  A/P: coronary artery disease asymptomatic - continue current medications Lipids at goal- continue current medications - too soon for lipid repeat Aortic atherosclerosis (presumed stable)- LDL goal ideally <70 - at goal   #Hypertension S: Compliant with lisinopril  40 mg, hydrochlorothiazide  12.5 mg added 2024 BP Readings from Last 3 Encounters:  05/07/24 120/70  04/30/24 128/88  02/22/24 126/82  A/P: well controlled continue current medications    # BPH with nocturia S:medication: None-tamsulosin  0.4 mg-was getting lightheaded with low blood pressures A/P: symptoms  somewhat better lately- continue to monitor    Recommended follow up: Return in 6 months (on 11/06/2024). Future Appointments  Date Time Provider Department Center  07/31/2024  9:15 AM  Ronnell Coins M, DO [facility] None    Lab/Order associations: fasting   ICD-10-CM   1. Routine general medical examination at a health care facility  Z00.00     2. Need for pneumococcal 20-valent conjugate vaccination  Z23 Pneumococcal conjugate vaccine 20-valent (Prevnar 20)    3. Controlled type 2 diabetes mellitus without complication, without long-term current use of insulin (HCC)  E11.9     4. Coronary artery disease involving native coronary artery of native heart without angina pectoris  I25.10     5. Essential hypertension  I10     6. Hyperlipidemia associated with type 2 diabetes mellitus (HCC)  E11.69    E78.5     7. Screening for prostate cancer  Z12.5     8. Drug-induced myopathy  G72.0     9. Screening exam for skin cancer  Z12.83       No orders of the defined types were placed in this encounter.   Return precautions advised.  Clarisa Crooked, MD

## 2024-05-07 NOTE — Patient Instructions (Addendum)
 Get Diabetic Eye Exam scheduled.  We have placed a referral for you today to Dr. Myrtie Atkinson dermatology- please call their # if you do not hear within a week (may be listed below or you may see mychart message within a few days with #).   Please stop by lab before you go If you have mychart- we will send your results within 3 business days of us  receiving them.  If you do not have mychart- we will call you about results within 5 business days of us  receiving them.  *please also note that you will see labs on mychart as soon as they post. I will later go in and write notes on them- will say "notes from Dr. Arlene Ben"   Recommended follow up: Return in 6 months (on 11/06/2024) for followup or sooner if needed.Schedule b4 you leave.  Health Maintenance, Male Adopting a healthy lifestyle and getting preventive care are important in promoting health and wellness. Ask your health care provider about: The right schedule for you to have regular tests and exams. Things you can do on your own to prevent diseases and keep yourself healthy. What should I know about diet, weight, and exercise? Eat a healthy diet  Eat a diet that includes plenty of vegetables, fruits, low-fat dairy products, and lean protein. Do not eat a lot of foods that are high in solid fats, added sugars, or sodium. Maintain a healthy weight Body mass index (BMI) is a measurement that can be used to identify possible weight problems. It estimates body fat based on height and weight. Your health care provider can help determine your BMI and help you achieve or maintain a healthy weight. Get regular exercise Get regular exercise. This is one of the most important things you can do for your health. Most adults should: Exercise for at least 150 minutes each week. The exercise should increase your heart rate and make you sweat (moderate-intensity exercise). Do strengthening exercises at least twice a week. This is in addition to the  moderate-intensity exercise. Spend less time sitting. Even light physical activity can be beneficial. Watch cholesterol and blood lipids Have your blood tested for lipids and cholesterol at 65 years of age, then have this test every 5 years. You may need to have your cholesterol levels checked more often if: Your lipid or cholesterol levels are high. You are older than 65 years of age. You are at high risk for heart disease. What should I know about cancer screening? Many types of cancers can be detected early and may often be prevented. Depending on your health history and family history, you may need to have cancer screening at various ages. This may include screening for: Colorectal cancer. Prostate cancer. Skin cancer. Lung cancer. What should I know about heart disease, diabetes, and high blood pressure? Blood pressure and heart disease High blood pressure causes heart disease and increases the risk of stroke. This is more likely to develop in people who have high blood pressure readings or are overweight. Talk with your health care provider about your target blood pressure readings. Have your blood pressure checked: Every 3-5 years if you are 3-20 years of age. Every year if you are 21 years old or older. If you are between the ages of 21 and 11 and are a current or former smoker, ask your health care provider if you should have a one-time screening for abdominal aortic aneurysm (AAA). Diabetes Have regular diabetes screenings. This checks your fasting blood sugar level.  Have the screening done: Once every three years after age 31 if you are at a normal weight and have a low risk for diabetes. More often and at a younger age if you are overweight or have a high risk for diabetes. What should I know about preventing infection? Hepatitis B If you have a higher risk for hepatitis B, you should be screened for this virus. Talk with your health care provider to find out if you are at  risk for hepatitis B infection. Hepatitis C Blood testing is recommended for: Everyone born from [birth year] through 1967. Anyone with known risk factors for hepatitis C. Sexually transmitted infections (STIs) You should be screened each year for STIs, including gonorrhea and chlamydia, if: You are sexually active and are younger than 65 years of age. You are older than 65 years of age and your health care provider tells you that you are at risk for this type of infection. Your sexual activity has changed since you were last screened, and you are at increased risk for chlamydia or gonorrhea. Ask your health care provider if you are at risk. Ask your health care provider about whether you are at high risk for HIV. Your health care provider may recommend a prescription medicine to help prevent HIV infection. If you choose to take medicine to prevent HIV, you should first get tested for HIV. You should then be tested every 3 months for as long as you are taking the medicine. Follow these instructions at home: Alcohol use Do not drink alcohol if your health care provider tells you not to drink. If you drink alcohol: Limit how much you have to 0-2 drinks a day. Know how much alcohol is in your drink. In the [place] one drink equals one 12 oz bottle of beer (355 mL), one 5 oz glass of wine (148 mL), or one 1 oz glass of hard liquor (44 mL). Lifestyle Do not use any products that contain nicotine or tobacco. These products include cigarettes, chewing tobacco, and vaping devices, such as e-cigarettes. If you need help quitting, ask your health care provider. Do not use street drugs. Do not share needles. Ask your health care provider for help if you need support or information about quitting drugs. General instructions Schedule regular health, dental, and eye exams. Stay current with your vaccines. Tell your health care provider if: You often feel depressed. You have ever been abused or do not feel safe  at home. Summary Adopting a healthy lifestyle and getting preventive care are important in promoting health and wellness. Follow your health care provider's instructions about healthy diet, exercising, and getting tested or screened for diseases. Follow your health care provider's instructions on monitoring your cholesterol and blood pressure. This information is not intended to replace advice given to you by your health care provider. Make sure you discuss any questions you have with your health care provider. Document Revised: 04/05/2021 Document Reviewed: 04/05/2021 Elsevier Patient Education  2024 [employer].

## 2024-05-09 ENCOUNTER — Ambulatory Visit: Payer: Self-pay | Admitting: Family Medicine

## 2024-05-09 LAB — PSA, MEDICARE: PSA: 1.97 ng/mL (ref 0.10–4.00)

## 2024-05-15 DIAGNOSIS — M47812 Spondylosis without myelopathy or radiculopathy, cervical region: Secondary | ICD-10-CM | POA: Diagnosis not present

## 2024-05-15 DIAGNOSIS — M9904 Segmental and somatic dysfunction of sacral region: Secondary | ICD-10-CM | POA: Diagnosis not present

## 2024-05-15 DIAGNOSIS — M9901 Segmental and somatic dysfunction of cervical region: Secondary | ICD-10-CM | POA: Diagnosis not present

## 2024-05-15 DIAGNOSIS — S233XXA Sprain of ligaments of thoracic spine, initial encounter: Secondary | ICD-10-CM | POA: Diagnosis not present

## 2024-05-15 DIAGNOSIS — S338XXA Sprain of other parts of lumbar spine and pelvis, initial encounter: Secondary | ICD-10-CM | POA: Diagnosis not present

## 2024-05-15 DIAGNOSIS — M9903 Segmental and somatic dysfunction of lumbar region: Secondary | ICD-10-CM | POA: Diagnosis not present

## 2024-05-29 DIAGNOSIS — M47812 Spondylosis without myelopathy or radiculopathy, cervical region: Secondary | ICD-10-CM | POA: Diagnosis not present

## 2024-05-29 DIAGNOSIS — M9901 Segmental and somatic dysfunction of cervical region: Secondary | ICD-10-CM | POA: Diagnosis not present

## 2024-05-29 DIAGNOSIS — M9903 Segmental and somatic dysfunction of lumbar region: Secondary | ICD-10-CM | POA: Diagnosis not present

## 2024-05-29 DIAGNOSIS — M9904 Segmental and somatic dysfunction of sacral region: Secondary | ICD-10-CM | POA: Diagnosis not present

## 2024-05-29 DIAGNOSIS — S233XXA Sprain of ligaments of thoracic spine, initial encounter: Secondary | ICD-10-CM | POA: Diagnosis not present

## 2024-05-29 DIAGNOSIS — S338XXA Sprain of other parts of lumbar spine and pelvis, initial encounter: Secondary | ICD-10-CM | POA: Diagnosis not present

## 2024-06-12 DIAGNOSIS — S233XXA Sprain of ligaments of thoracic spine, initial encounter: Secondary | ICD-10-CM | POA: Diagnosis not present

## 2024-06-12 DIAGNOSIS — M9901 Segmental and somatic dysfunction of cervical region: Secondary | ICD-10-CM | POA: Diagnosis not present

## 2024-06-12 DIAGNOSIS — M9904 Segmental and somatic dysfunction of sacral region: Secondary | ICD-10-CM | POA: Diagnosis not present

## 2024-06-12 DIAGNOSIS — M47812 Spondylosis without myelopathy or radiculopathy, cervical region: Secondary | ICD-10-CM | POA: Diagnosis not present

## 2024-06-12 DIAGNOSIS — M9903 Segmental and somatic dysfunction of lumbar region: Secondary | ICD-10-CM | POA: Diagnosis not present

## 2024-06-12 DIAGNOSIS — S338XXA Sprain of other parts of lumbar spine and pelvis, initial encounter: Secondary | ICD-10-CM | POA: Diagnosis not present

## 2024-06-26 DIAGNOSIS — S338XXA Sprain of other parts of lumbar spine and pelvis, initial encounter: Secondary | ICD-10-CM | POA: Diagnosis not present

## 2024-06-26 DIAGNOSIS — M9903 Segmental and somatic dysfunction of lumbar region: Secondary | ICD-10-CM | POA: Diagnosis not present

## 2024-06-26 DIAGNOSIS — M9901 Segmental and somatic dysfunction of cervical region: Secondary | ICD-10-CM | POA: Diagnosis not present

## 2024-06-26 DIAGNOSIS — M9904 Segmental and somatic dysfunction of sacral region: Secondary | ICD-10-CM | POA: Diagnosis not present

## 2024-06-26 DIAGNOSIS — S233XXA Sprain of ligaments of thoracic spine, initial encounter: Secondary | ICD-10-CM | POA: Diagnosis not present

## 2024-06-26 DIAGNOSIS — M47812 Spondylosis without myelopathy or radiculopathy, cervical region: Secondary | ICD-10-CM | POA: Diagnosis not present

## 2024-07-10 DIAGNOSIS — S233XXA Sprain of ligaments of thoracic spine, initial encounter: Secondary | ICD-10-CM | POA: Diagnosis not present

## 2024-07-10 DIAGNOSIS — M47812 Spondylosis without myelopathy or radiculopathy, cervical region: Secondary | ICD-10-CM | POA: Diagnosis not present

## 2024-07-10 DIAGNOSIS — M9904 Segmental and somatic dysfunction of sacral region: Secondary | ICD-10-CM | POA: Diagnosis not present

## 2024-07-10 DIAGNOSIS — M9901 Segmental and somatic dysfunction of cervical region: Secondary | ICD-10-CM | POA: Diagnosis not present

## 2024-07-10 DIAGNOSIS — S338XXA Sprain of other parts of lumbar spine and pelvis, initial encounter: Secondary | ICD-10-CM | POA: Diagnosis not present

## 2024-07-10 DIAGNOSIS — M9903 Segmental and somatic dysfunction of lumbar region: Secondary | ICD-10-CM | POA: Diagnosis not present

## 2024-07-24 DIAGNOSIS — M9904 Segmental and somatic dysfunction of sacral region: Secondary | ICD-10-CM | POA: Diagnosis not present

## 2024-07-24 DIAGNOSIS — M9901 Segmental and somatic dysfunction of cervical region: Secondary | ICD-10-CM | POA: Diagnosis not present

## 2024-07-24 DIAGNOSIS — S338XXA Sprain of other parts of lumbar spine and pelvis, initial encounter: Secondary | ICD-10-CM | POA: Diagnosis not present

## 2024-07-24 DIAGNOSIS — M9903 Segmental and somatic dysfunction of lumbar region: Secondary | ICD-10-CM | POA: Diagnosis not present

## 2024-07-24 DIAGNOSIS — M47812 Spondylosis without myelopathy or radiculopathy, cervical region: Secondary | ICD-10-CM | POA: Diagnosis not present

## 2024-07-24 DIAGNOSIS — S233XXA Sprain of ligaments of thoracic spine, initial encounter: Secondary | ICD-10-CM | POA: Diagnosis not present

## 2024-07-30 NOTE — Progress Notes (Unsigned)
 [name] [address] [place] [address] Phone: [phone] Subjective:   LILLETTE Berwyn Posey, am serving as a scribe for Dr. Arthea Claudene.  I'm seeing this patient by the request  of:  Katrinka Garnette KIDD, MD  CC: Right sided back pain  YEP:Dlagzrupcz  04/30/2024 MRI does show some lumbar radiculopathy that is consistent with his symptoms.  Shows that the nerve root is being impacted.  Unable to tolerate the Cymbalta , held on the other type of osteopathic manipulation at the moment.  Doing well with dry needling and other conservative therapy.  I have refilled the meloxicam  15 mg daily as well as the Zanaflex  4 mg.  Follow-up with me again in 2 to 3 months.  Worsening pain will consider the possibility of the epidural at the L5-S1 area     Update 07/31/2024 JAHREL BORTHWICK is a 63 y.o. male coming in with complaint of low back pain. Patient states that his pain has been manageable. Pain today is worse in R glute medius. Does get regular chiropractic care in [place].  Still some localized.  Nothing that stopping him from activity.      Past Medical History:  Diagnosis Date   Allergy    flonase    Anxiety    OCC   Arthritis    COVID-19    Diabetes mellitus    diet controlled   GERD (gastroesophageal reflux disease)    past hx lost weight   Hemorrhoids    occ inflammation   Hx of adenomatous polyp of colon 04/28/2017   Mixed hyperlipidemia    Nonspecific elevation of levels of transaminase or lactic acid dehydrogenase (LDH)    Unspecified essential hypertension    Past Surgical History:  Procedure Laterality Date   APPENDECTOMY  1965   COLONOSCOPY     POLYPECTOMY     Social History   Socioeconomic History   Marital status: Married    Spouse name: Not on file   Number of children: Not on file   Years of education: Not on file   Highest education level: 12th grade  Occupational History   Not on file  Tobacco Use   Smoking status: Never    Smokeless tobacco: Never  Vaping Use   Vaping status: Never Used  Substance and Sexual Activity   Alcohol use: Yes    Alcohol/week: 1.0 standard drink of alcohol    Types: 1 Standard drinks or equivalent per week    Comment: occ   Drug use: No   Sexual activity: Never  Other Topics Concern   Not on file  Social History Narrative   Married 2007. No kids. 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), guns, target shooting, travel   Social Drivers of Corporate investment banker Strain: Low Risk  (10/30/2023)   Overall Financial Resource Strain (CARDIA)    Difficulty of Paying Living Expenses: Not hard at all  Food Insecurity: No Food Insecurity (10/30/2023)   Hunger Vital Sign    Worried About Running Out of Food in the Last Year: Never true    Ran Out of Food in the Last Year: Never true  Transportation Needs: No Transportation Needs (10/30/2023)  PRAPARE - Administrator, Civil Service (Medical): No    Lack of Transportation (Non-Medical): No  Physical Activity: Unknown (10/30/2023)   Exercise Vital Sign    Days of Exercise per Week: 0 days    Minutes of Exercise per Session: Not on file  Stress: No Stress Concern Present (10/30/2023)   [employer] of Occupational Health - Occupational Stress Questionnaire    Feeling of Stress : Not at all  Social Connections: Socially Integrated (10/30/2023)   Social Connection and Isolation Panel    Frequency of Communication with Friends and Family: More than three times a week    Frequency of Social Gatherings with Friends and Family: Once a week    Attends Religious Services: More than 4 times per year    Active Member of [employer] or Organizations: Yes    Attends Engineer, structural: More than 4  times per year    Marital Status: Married   Allergies  Allergen Reactions   Amlodipine      gingival hyperplasia    Family History  Problem Relation Age of Onset   COPD Mother    Emphysema Mother    Heart disease Father        heavy smoker, 55   Coronary artery disease Father    Heart attack Father    Colon cancer Neg Hx    Colon polyps Neg Hx    Esophageal cancer Neg Hx    Rectal cancer Neg Hx    Stomach cancer Neg Hx      Current Outpatient Medications (Cardiovascular):    EPINEPHrine  0.3 mg/0.3 mL IJ SOAJ injection, Inject 0.3 mg into the muscle as needed for anaphylaxis.   Evolocumab  (REPATHA  SURECLICK) 140 MG/ML SOAJ, Inject 140 mg into the skin every 14 (fourteen) days.   ezetimibe  (ZETIA ) 10 MG tablet, Take 1 tablet (10 mg total) by mouth daily.   hydrochlorothiazide  (HYDRODIURIL ) 12.5 MG tablet, Take 1 tablet (12.5 mg total) by mouth daily.   icosapent  Ethyl (VASCEPA ) 1 g capsule, Take 2 capsules (2 g total) by mouth 2 (two) times daily.   lisinopril  (ZESTRIL ) 40 MG tablet, TAKE 1 TABLET(40 MG) BY MOUTH DAILY   lovastatin  (MEVACOR ) 10 MG tablet, Take 1 tablet (10 mg total) by mouth once a week.  Current Outpatient Medications (Respiratory):    fluticasone  (FLONASE ) 50 MCG/ACT nasal spray, Place 1 spray into both nostrils daily.  Current Outpatient Medications (Analgesics):    aspirin EC 81 MG tablet, Take 81 mg by mouth daily. Swallow whole.   meloxicam  (MOBIC ) 7.5 MG tablet, Take 1 tablet by mouth once daily  Current Outpatient Medications (Hematological):    vitamin B-12 (CYANOCOBALAMIN ) 500 MCG tablet, Take 500 mcg by mouth daily.  Current Outpatient Medications (Other):    clobetasol  cream (TEMOVATE ) 0.05 %, Apply 1 Application topically 2 (two) times daily. Eczema for up to 7-10 days   glucose blood (ACCU-CHEK AVIVA) test strip, Check blood sugar daily as needed.   glucose blood test strip, 1 each by Other route daily. Use as instructed   Lancet Devices  (ACCU-CHEK SOFTCLIX) lancets, 1 each by Other route daily. Use as instructed   Multiple Vitamin (MULTIVITAMIN) tablet, Take 1 tablet by mouth daily.   tiZANidine  (ZANAFLEX ) 4 MG tablet, Take 1 tablet (4 mg total) by mouth at bedtime.   Reviewed prior external information including notes and imaging from  primary care provider As well as notes that were available from care everywhere and other healthcare systems.  Past medical history, social, surgical and family history all reviewed in electronic medical record.  No pertanent information unless stated regarding to the chief complaint.   Review of Systems:  No headache, visual changes, nausea, vomiting, diarrhea, constipation, dizziness, abdominal pain, skin rash, fevers, chills, night sweats, weight loss, swollen lymph nodes, body aches, joint swelling, chest pain, shortness of breath, mood changes. POSITIVE muscle aches  Objective  Blood pressure 118/82, pulse (!) 56, height 5' 6 (1.676 m), weight 155 lb (70.3 kg), SpO2 99%.   General: No apparent distress alert and oriented x3 mood and affect normal, dressed appropriately.  HEENT: Pupils equal, extraocular movements intact  Respiratory: Patient's speak in full sentences and does not appear short of breath  Cardiovascular: No lower extremity edema, non tender, no erythema  Low back does have some loss lordosis.  Some tightness noted with FABER testing tightness with straight leg test but no true radicular symptoms.  Symmetric strength noted of the lower extremities as well as DTRs.    Impression and Recommendations:    The above documentation has been reviewed and is accurate and complete Fayetta Sorenson M Devonne Lalani, DO

## 2024-07-31 ENCOUNTER — Ambulatory Visit: Payer: Self-pay | Admitting: Family Medicine

## 2024-07-31 ENCOUNTER — Ambulatory Visit: Admitting: Family Medicine

## 2024-07-31 ENCOUNTER — Other Ambulatory Visit: Payer: Self-pay | Admitting: Family Medicine

## 2024-07-31 VITALS — BP 118/82 | HR 56 | Ht 66.0 in | Wt 155.0 lb

## 2024-07-31 DIAGNOSIS — M545 Low back pain, unspecified: Secondary | ICD-10-CM | POA: Diagnosis not present

## 2024-07-31 DIAGNOSIS — G8929 Other chronic pain: Secondary | ICD-10-CM

## 2024-07-31 DIAGNOSIS — R5383 Other fatigue: Secondary | ICD-10-CM | POA: Diagnosis not present

## 2024-07-31 LAB — IBC PANEL
Iron: 116 ug/dL (ref 42–165)
Saturation Ratios: 27.7 % (ref 20.0–50.0)
TIBC: 418.6 ug/dL (ref 250.0–450.0)
Transferrin: 299 mg/dL (ref 212.0–360.0)

## 2024-07-31 LAB — URIC ACID: Uric Acid, Serum: 9.6 mg/dL — ABNORMAL HIGH (ref 4.0–7.8)

## 2024-07-31 LAB — VITAMIN B12: Vitamin B-12: 633 pg/mL (ref 211–911)

## 2024-07-31 LAB — TSH: TSH: 1.61 u[IU]/mL (ref 0.35–5.50)

## 2024-07-31 LAB — VITAMIN D 25 HYDROXY (VIT D DEFICIENCY, FRACTURES): VITD: 41.95 ng/mL (ref 30.00–100.00)

## 2024-07-31 LAB — FERRITIN: Ferritin: 182.8 ng/mL (ref 22.0–322.0)

## 2024-07-31 NOTE — Assessment & Plan Note (Signed)
 Holding on any manipulation.  Seems to be responding relatively well to conservative therapy even though MRI did show nerve root impingement.  Discussed again that the possibility of epidurals if patient is willing.  Discussed icing regimen and home exercises otherwise.  Will continue to be active.  Patient will be traveling to the beach again for 2 months.  Will see him back again at the end of to further evaluate.

## 2024-07-31 NOTE — Patient Instructions (Signed)
 Labs today Try to do exercises See me in December

## 2024-08-07 DIAGNOSIS — M47812 Spondylosis without myelopathy or radiculopathy, cervical region: Secondary | ICD-10-CM | POA: Diagnosis not present

## 2024-08-07 DIAGNOSIS — M9901 Segmental and somatic dysfunction of cervical region: Secondary | ICD-10-CM | POA: Diagnosis not present

## 2024-08-07 DIAGNOSIS — M9903 Segmental and somatic dysfunction of lumbar region: Secondary | ICD-10-CM | POA: Diagnosis not present

## 2024-08-07 DIAGNOSIS — S233XXA Sprain of ligaments of thoracic spine, initial encounter: Secondary | ICD-10-CM | POA: Diagnosis not present

## 2024-08-07 DIAGNOSIS — M9904 Segmental and somatic dysfunction of sacral region: Secondary | ICD-10-CM | POA: Diagnosis not present

## 2024-08-07 DIAGNOSIS — S338XXA Sprain of other parts of lumbar spine and pelvis, initial encounter: Secondary | ICD-10-CM | POA: Diagnosis not present

## 2024-08-14 ENCOUNTER — Other Ambulatory Visit: Payer: Self-pay | Admitting: Cardiology

## 2024-08-14 DIAGNOSIS — E1169 Type 2 diabetes mellitus with other specified complication: Secondary | ICD-10-CM

## 2024-08-14 DIAGNOSIS — R072 Precordial pain: Secondary | ICD-10-CM

## 2024-08-21 DIAGNOSIS — M47812 Spondylosis without myelopathy or radiculopathy, cervical region: Secondary | ICD-10-CM | POA: Diagnosis not present

## 2024-08-21 DIAGNOSIS — M9903 Segmental and somatic dysfunction of lumbar region: Secondary | ICD-10-CM | POA: Diagnosis not present

## 2024-08-21 DIAGNOSIS — S338XXA Sprain of other parts of lumbar spine and pelvis, initial encounter: Secondary | ICD-10-CM | POA: Diagnosis not present

## 2024-08-21 DIAGNOSIS — M9904 Segmental and somatic dysfunction of sacral region: Secondary | ICD-10-CM | POA: Diagnosis not present

## 2024-08-21 DIAGNOSIS — S233XXA Sprain of ligaments of thoracic spine, initial encounter: Secondary | ICD-10-CM | POA: Diagnosis not present

## 2024-08-21 DIAGNOSIS — M9901 Segmental and somatic dysfunction of cervical region: Secondary | ICD-10-CM | POA: Diagnosis not present

## 2024-09-11 DIAGNOSIS — M47812 Spondylosis without myelopathy or radiculopathy, cervical region: Secondary | ICD-10-CM | POA: Diagnosis not present

## 2024-09-11 DIAGNOSIS — S233XXA Sprain of ligaments of thoracic spine, initial encounter: Secondary | ICD-10-CM | POA: Diagnosis not present

## 2024-09-11 DIAGNOSIS — M9904 Segmental and somatic dysfunction of sacral region: Secondary | ICD-10-CM | POA: Diagnosis not present

## 2024-09-11 DIAGNOSIS — M9901 Segmental and somatic dysfunction of cervical region: Secondary | ICD-10-CM | POA: Diagnosis not present

## 2024-09-11 DIAGNOSIS — S338XXA Sprain of other parts of lumbar spine and pelvis, initial encounter: Secondary | ICD-10-CM | POA: Diagnosis not present

## 2024-09-11 DIAGNOSIS — M9903 Segmental and somatic dysfunction of lumbar region: Secondary | ICD-10-CM | POA: Diagnosis not present

## 2024-10-07 ENCOUNTER — Other Ambulatory Visit: Payer: Self-pay | Admitting: Family Medicine

## 2024-10-29 ENCOUNTER — Other Ambulatory Visit: Payer: Self-pay | Admitting: Family Medicine

## 2024-10-29 ENCOUNTER — Other Ambulatory Visit (HOSPITAL_COMMUNITY): Payer: Self-pay

## 2024-10-30 ENCOUNTER — Telehealth: Payer: Self-pay | Admitting: Pharmacy Technician

## 2024-10-31 ENCOUNTER — Other Ambulatory Visit: Payer: Self-pay | Admitting: Family Medicine

## 2024-10-31 NOTE — Progress Notes (Deleted)
 [name] [address] [place] [address] Phone: [phone] Subjective:    I'm seeing this patient by the request  of:  Katrinka Garnette KIDD, MD  CC:   YEP:Dlagzrupcz  07/31/2024 Holding on any manipulation. Seems to be responding relatively well to conservative therapy even though MRI did show nerve root impingement. Discussed again that the possibility of epidurals if patient is willing. Discussed icing regimen and home exercises otherwise. Will continue to be active. Patient will be traveling to the beach again for 2 months. Will see him back again at the end of to further evaluate.   Update 11/05/2024 RENAUD CELLI is a 63 y.o. male coming in with complaint of lower back pain. Patient states      Past Medical History:  Diagnosis Date   Allergy    flonase    Anxiety    OCC   Arthritis    COVID-19    Diabetes mellitus    diet controlled   GERD (gastroesophageal reflux disease)    past hx lost weight   Hemorrhoids    occ inflammation   Hx of adenomatous polyp of colon 04/28/2017   Mixed hyperlipidemia    Nonspecific elevation of levels of transaminase or lactic acid dehydrogenase (LDH)    Unspecified essential hypertension    Past Surgical History:  Procedure Laterality Date   APPENDECTOMY  1965   COLONOSCOPY     POLYPECTOMY     Social History   Socioeconomic History   Marital status: Married    Spouse name: Not on file   Number of children: Not on file   Years of education: Not on file   Highest education level: 12th grade  Occupational History   Not on file  Tobacco Use   Smoking status: Never   Smokeless tobacco: Never  Vaping Use   Vaping status: Never Used  Substance and Sexual Activity   Alcohol use: Yes    Alcohol/week: 1.0 standard drink of alcohol    Types: 1 Standard drinks or equivalent per week    Comment: occ   Drug use: No   Sexual activity: Never  Other Topics Concern   Not on file  Social History  Narrative   Married 2007. No kids. 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), guns, target shooting, travel   Social Drivers of Corporate Investment Banker Strain: Low Risk  (10/30/2023)   Overall Financial Resource Strain (CARDIA)    Difficulty of Paying Living Expenses: Not hard at all  Food Insecurity: No Food Insecurity (10/30/2023)   Hunger Vital Sign    Worried About Running Out of Food in the Last Year: Never true    Ran Out of Food in the Last Year: Never true  Transportation Needs: No Transportation Needs (10/30/2023)   PRAPARE - Administrator, Civil Service (Medical): No    Lack of Transportation (Non-Medical): No  Physical Activity: Unknown (10/30/2023)   Exercise Vital Sign    Days of Exercise per Week: 0 days    Minutes of Exercise per Session: Not on file  Stress: No Stress Concern Present (10/30/2023)   [employer] of Occupational Health - Occupational Stress Questionnaire  Feeling of Stress : Not at all  Social Connections: Socially Integrated (10/30/2023)   Social Connection and Isolation Panel    Frequency of Communication with Friends and Family: More than three times a week    Frequency of Social Gatherings with Friends and Family: Once a week    Attends Religious Services: More than 4 times per year    Active Member of [employer] or Organizations: Yes    Attends Engineer, Structural: More than 4 times per year    Marital Status: Married   Allergies  Allergen Reactions   Amlodipine      gingival hyperplasia    Family History  Problem Relation Age of Onset   COPD Mother    Emphysema Mother    Heart disease Father        heavy smoker, 24   Coronary artery disease Father    Heart attack Father    Colon cancer Neg Hx     Colon polyps Neg Hx    Esophageal cancer Neg Hx    Rectal cancer Neg Hx    Stomach cancer Neg Hx      Current Outpatient Medications (Cardiovascular):    EPINEPHrine  0.3 mg/0.3 mL IJ SOAJ injection, Inject 0.3 mg into the muscle as needed for anaphylaxis.   ezetimibe  (ZETIA ) 10 MG tablet, Take 1 tablet (10 mg total) by mouth daily.   hydrochlorothiazide  (HYDRODIURIL ) 12.5 MG tablet, Take 1 tablet (12.5 mg total) by mouth daily.   icosapent  Ethyl (VASCEPA ) 1 g capsule, Take 2 capsules (2 g total) by mouth 2 (two) times daily.   lisinopril  (ZESTRIL ) 40 MG tablet, Take 1 tablet by mouth once daily   lovastatin  (MEVACOR ) 10 MG tablet, Take 1 tablet (10 mg total) by mouth once a week.   REPATHA  SURECLICK 140 MG/ML SOAJ, INJECT 140 MG INTO THE SKIN EVERY 14 DAYS  Current Outpatient Medications (Respiratory):    fluticasone  (FLONASE ) 50 MCG/ACT nasal spray, Place 1 spray into both nostrils daily.  Current Outpatient Medications (Analgesics):    aspirin EC 81 MG tablet, Take 81 mg by mouth daily. Swallow whole.   meloxicam  (MOBIC ) 7.5 MG tablet, Take 1 tablet by mouth once daily  Current Outpatient Medications (Hematological):    vitamin B-12 (CYANOCOBALAMIN ) 500 MCG tablet, Take 500 mcg by mouth daily.  Current Outpatient Medications (Other):    clobetasol  cream (TEMOVATE ) 0.05 %, Apply 1 Application topically 2 (two) times daily. Eczema for up to 7-10 days   glucose blood (ACCU-CHEK AVIVA) test strip, Check blood sugar daily as needed.   glucose blood test strip, 1 each by Other route daily. Use as instructed   Lancet Devices (ACCU-CHEK SOFTCLIX) lancets, 1 each by Other route daily. Use as instructed   Multiple Vitamin (MULTIVITAMIN) tablet, Take 1 tablet by mouth daily.   tiZANidine  (ZANAFLEX ) 4 MG tablet, Take 1 tablet (4 mg total) by mouth at bedtime.   Reviewed prior external information including notes and imaging from  primary care provider As well as notes that were  available from care everywhere and other healthcare systems.  Past medical history, social, surgical and family history all reviewed in electronic medical record.  No pertanent information unless stated regarding to the chief complaint.   Review of Systems:  No headache, visual changes, nausea, vomiting, diarrhea, constipation, dizziness, abdominal pain, skin rash, fevers, chills, night sweats, weight loss, swollen lymph nodes, body aches, joint swelling, chest pain, shortness of breath, mood changes. POSITIVE muscle aches  Objective  There were  no vitals taken for this visit.   General: No apparent distress alert and oriented x3 mood and affect normal, dressed appropriately.  HEENT: Pupils equal, extraocular movements intact  Respiratory: Patient's speak in full sentences and does not appear short of breath  Cardiovascular: No lower extremity edema, non tender, no erythema      Impression and Recommendations:

## 2024-11-05 ENCOUNTER — Ambulatory Visit: Admitting: Family Medicine

## 2024-11-05 NOTE — Progress Notes (Unsigned)
 [name] [address] [place] [address] Phone: [phone] Subjective:    I'm seeing this patient by the request  of:  Katrinka Garnette KIDD, MD  CC:   YEP:Dlagzrupcz  07/31/2024 Holding on any manipulation. Seems to be responding relatively well to conservative therapy even though MRI did show nerve root impingement. Discussed again that the possibility of epidurals if patient is willing. Discussed icing regimen and home exercises otherwise. Will continue to be active. Patient will be traveling to the beach again for 2 months. Will see him back again at the end of to further evaluate.   Update 11/06/2024 Alex [name] is a 63 y.o. male coming in with complaint of lower back pain. Patient states      Past Medical History:  Diagnosis Date   Allergy    flonase    Anxiety    OCC   Arthritis    COVID-19    Diabetes mellitus    diet controlled   GERD (gastroesophageal reflux disease)    past hx lost weight   Hemorrhoids    occ inflammation   Hx of adenomatous polyp of colon 04/28/2017   Mixed hyperlipidemia    Nonspecific elevation of levels of transaminase or lactic acid dehydrogenase (LDH)    Unspecified essential hypertension    Past Surgical History:  Procedure Laterality Date   APPENDECTOMY  1965   COLONOSCOPY     POLYPECTOMY     Social History   Socioeconomic History   Marital status: Married    Spouse name: Not on file   Number of children: Not on file   Years of education: Not on file   Highest education level: 12th grade  Occupational History   Not on file  Tobacco Use   Smoking status: Never   Smokeless tobacco: Never  Vaping Use   Vaping status: Never Used  Substance and Sexual Activity   Alcohol use: Yes    Alcohol/week: 1.0 standard drink of alcohol    Types: 1 Standard drinks or equivalent per week    Comment: occ   Drug use: No   Sexual activity: Never  Other Topics Concern   Not on file  Social History  Narrative   Married 2007. No kids. 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), guns, target shooting, travel   Social Drivers of Corporate Investment Banker Strain: Low Risk  (10/30/2023)   Overall Financial Resource Strain (CARDIA)    Difficulty of Paying Living Expenses: Not hard at all  Food Insecurity: No Food Insecurity (10/30/2023)   Hunger Vital Sign    Worried About Running Out of Food in the Last Year: Never true    Ran Out of Food in the Last Year: Never true  Transportation Needs: No Transportation Needs (10/30/2023)   PRAPARE - Administrator, Civil Service (Medical): No    Lack of Transportation (Non-Medical): No  Physical Activity: Unknown (10/30/2023)   Exercise Vital Sign    Days of Exercise per Week: 0 days    Minutes of Exercise per Session: Not on file  Stress: No Stress Concern Present (10/30/2023)   [employer] of Occupational Health - Occupational Stress Questionnaire  Feeling of Stress : Not at all  Social Connections: Socially Integrated (10/30/2023)   Social Connection and Isolation Panel    Frequency of Communication with Friends and Family: More than three times a week    Frequency of Social Gatherings with Friends and Family: Once a week    Attends Religious Services: More than 4 times per year    Active Member of [employer] or Organizations: Yes    Attends Engineer, Structural: More than 4 times per year    Marital Status: Married   Allergies  Allergen Reactions   Amlodipine      gingival hyperplasia    Family History  Problem Relation Age of Onset   COPD Mother    Emphysema Mother    Heart disease Father        heavy smoker, 53   Coronary artery disease Father    Heart attack Father    Colon cancer Neg Hx     Colon polyps Neg Hx    Esophageal cancer Neg Hx    Rectal cancer Neg Hx    Stomach cancer Neg Hx      Current Outpatient Medications (Cardiovascular):    EPINEPHrine  0.3 mg/0.3 mL IJ SOAJ injection, Inject 0.3 mg into the muscle as needed for anaphylaxis.   ezetimibe  (ZETIA ) 10 MG tablet, Take 1 tablet (10 mg total) by mouth daily.   hydrochlorothiazide  (HYDRODIURIL ) 12.5 MG tablet, Take 1 tablet (12.5 mg total) by mouth daily.   icosapent  Ethyl (VASCEPA ) 1 g capsule, Take 2 capsules (2 g total) by mouth 2 (two) times daily.   lisinopril  (ZESTRIL ) 40 MG tablet, Take 1 tablet by mouth once daily   lovastatin  (MEVACOR ) 10 MG tablet, Take 1 tablet (10 mg total) by mouth once a week.   REPATHA  SURECLICK 140 MG/ML SOAJ, INJECT 140 MG INTO THE SKIN EVERY 14 DAYS  Current Outpatient Medications (Respiratory):    fluticasone  (FLONASE ) 50 MCG/ACT nasal spray, Place 1 spray into both nostrils daily.  Current Outpatient Medications (Analgesics):    aspirin EC 81 MG tablet, Take 81 mg by mouth daily. Swallow whole.   meloxicam  (MOBIC ) 7.5 MG tablet, Take 1 tablet by mouth once daily  Current Outpatient Medications (Hematological):    vitamin B-12 (CYANOCOBALAMIN ) 500 MCG tablet, Take 500 mcg by mouth daily.  Current Outpatient Medications (Other):    clobetasol  cream (TEMOVATE ) 0.05 %, Apply 1 Application topically 2 (two) times daily. Eczema for up to 7-10 days   glucose blood (ACCU-CHEK AVIVA) test strip, Check blood sugar daily as needed.   glucose blood test strip, 1 each by Other route daily. Use as instructed   Lancet Devices (ACCU-CHEK SOFTCLIX) lancets, 1 each by Other route daily. Use as instructed   Multiple Vitamin (MULTIVITAMIN) tablet, Take 1 tablet by mouth daily.   tiZANidine  (ZANAFLEX ) 4 MG tablet, Take 1 tablet (4 mg total) by mouth at bedtime.   Reviewed prior external information including notes and imaging from  primary care provider As well as notes that were  available from care everywhere and other healthcare systems.  Past medical history, social, surgical and family history all reviewed in electronic medical record.  No pertanent information unless stated regarding to the chief complaint.   Review of Systems:  No headache, visual changes, nausea, vomiting, diarrhea, constipation, dizziness, abdominal pain, skin rash, fevers, chills, night sweats, weight loss, swollen lymph nodes, body aches, joint swelling, chest pain, shortness of breath, mood changes. POSITIVE muscle aches  Objective  There were  no vitals taken for this visit.   General: No apparent distress alert and oriented x3 mood and affect normal, dressed appropriately.  HEENT: Pupils equal, extraocular movements intact  Respiratory: Patient's speak in full sentences and does not appear short of breath  Cardiovascular: No lower extremity edema, non tender, no erythema      Impression and Recommendations:

## 2024-11-06 ENCOUNTER — Telehealth: Payer: Self-pay | Admitting: Family Medicine

## 2024-11-06 ENCOUNTER — Ambulatory Visit: Admitting: Family Medicine

## 2024-11-06 ENCOUNTER — Other Ambulatory Visit: Payer: Self-pay | Admitting: Family Medicine

## 2024-11-06 ENCOUNTER — Encounter: Payer: Self-pay | Admitting: Family Medicine

## 2024-11-06 ENCOUNTER — Ambulatory Visit: Payer: Self-pay | Admitting: Family Medicine

## 2024-11-06 VITALS — BP 118/62 | HR 74 | Temp 97.7°F | Ht 66.0 in | Wt 165.8 lb

## 2024-11-06 VITALS — BP 124/82 | HR 73 | Ht 66.0 in | Wt 168.0 lb

## 2024-11-06 DIAGNOSIS — Z23 Encounter for immunization: Secondary | ICD-10-CM

## 2024-11-06 DIAGNOSIS — E118 Type 2 diabetes mellitus with unspecified complications: Secondary | ICD-10-CM

## 2024-11-06 DIAGNOSIS — E1169 Type 2 diabetes mellitus with other specified complication: Secondary | ICD-10-CM

## 2024-11-06 DIAGNOSIS — I10 Essential (primary) hypertension: Secondary | ICD-10-CM

## 2024-11-06 DIAGNOSIS — M545 Low back pain, unspecified: Secondary | ICD-10-CM

## 2024-11-06 DIAGNOSIS — I2510 Atherosclerotic heart disease of native coronary artery without angina pectoris: Secondary | ICD-10-CM

## 2024-11-06 DIAGNOSIS — G8929 Other chronic pain: Secondary | ICD-10-CM

## 2024-11-06 DIAGNOSIS — G720 Drug-induced myopathy: Secondary | ICD-10-CM

## 2024-11-06 LAB — CBC WITH DIFFERENTIAL/PLATELET
Basophils Absolute: 0 K/uL (ref 0.0–0.1)
Basophils Relative: 0.4 % (ref 0.0–3.0)
Eosinophils Absolute: 0.3 K/uL (ref 0.0–0.7)
Eosinophils Relative: 3.4 % (ref 0.0–5.0)
HCT: 41.1 % (ref 39.0–52.0)
Hemoglobin: 14.2 g/dL (ref 13.0–17.0)
Lymphocytes Relative: 21.6 % (ref 12.0–46.0)
Lymphs Abs: 1.9 K/uL (ref 0.7–4.0)
MCHC: 34.5 g/dL (ref 30.0–36.0)
MCV: 86.6 fl (ref 78.0–100.0)
Monocytes Absolute: 0.7 K/uL (ref 0.1–1.0)
Monocytes Relative: 8.3 % (ref 3.0–12.0)
Neutro Abs: 5.9 K/uL (ref 1.4–7.7)
Neutrophils Relative %: 66.3 % (ref 43.0–77.0)
Platelets: 289 K/uL (ref 150.0–400.0)
RBC: 4.74 Mil/uL (ref 4.22–5.81)
RDW: 12.6 % (ref 11.5–15.5)
WBC: 8.9 K/uL (ref 4.0–10.5)

## 2024-11-06 LAB — COMPREHENSIVE METABOLIC PANEL WITH GFR
ALT: 49 U/L (ref 0–53)
AST: 44 U/L — ABNORMAL HIGH (ref 0–37)
Albumin: 4.8 g/dL (ref 3.5–5.2)
Alkaline Phosphatase: 71 U/L (ref 39–117)
BUN: 20 mg/dL (ref 6–23)
CO2: 30 meq/L (ref 19–32)
Calcium: 10.2 mg/dL (ref 8.4–10.5)
Chloride: 101 meq/L (ref 96–112)
Creatinine, Ser: 0.99 mg/dL (ref 0.40–1.50)
GFR: 79.87 mL/min (ref >60.00)
Glucose, Bld: 117 mg/dL — ABNORMAL HIGH (ref 70–99)
Potassium: 4.5 meq/L (ref 3.5–5.1)
Sodium: 138 meq/L (ref 135–145)
Total Bilirubin: 0.9 mg/dL (ref 0.2–1.2)
Total Protein: 7.5 g/dL (ref 6.0–8.3)

## 2024-11-06 LAB — HEMOGLOBIN A1C: Hgb A1c MFr Bld: 6.5 % (ref 4.6–6.5)

## 2024-11-06 MED ORDER — LANCET DEVICE MISC: 1.0000 | 0 refills | Status: AC

## 2024-11-06 MED ORDER — CLOBETASOL PROPIONATE 0.05 % EX CREA: 1.0000 | TOPICAL_CREAM | Freq: Two times a day (BID) | CUTANEOUS | 2 refills | Status: AC

## 2024-11-06 MED ORDER — BLOOD GLUCOSE TEST VI STRP: 1.0000 | ORAL_STRIP | 0 refills | Status: AC

## 2024-11-06 MED ORDER — BLOOD GLUCOSE MONITORING SUPPL DEVI: 1.0000 | 0 refills | Status: DC

## 2024-11-06 MED ORDER — GLUCOSE BLOOD VI STRP: ORAL_STRIP | 3 refills | Status: DC

## 2024-11-06 MED ORDER — LANCETS MISC: 1.0000 | 0 refills | Status: AC

## 2024-11-06 NOTE — Telephone Encounter (Signed)
 Noted.   Copied from CRM #[id number]. Topic: Clinical - Prescription Issue >> Nov 06, 2024 10:01 AM Sophia H wrote: Reason for CRM: Spoke with [facility] pharmacy who states RX was received for diabetic supplies but is not covered by insurance. Wanting to know if PCP will send in an RX for Accu chek guide meter, strips and lancets   [id number] - [place], [place] - [address]

## 2024-11-06 NOTE — Patient Instructions (Addendum)
 Please stop by lab before you go If you have mychart- we will send your results within 3 business days of us  receiving them.  If you do not have mychart- we will call you about results within 5 business days of us  receiving them.  *please also note that you will see labs on mychart as soon as they post. I will later go in and write notes on them- will say notes from Dr. Katrinka   blood pressure very well controlled in office and even better control at home this morning- we opted with high uric acid to trial off the hydrochlorothiazide  completely but continue lisinopril  40 mg . Considering losartan  instead of lisinopril  as well but want to make sure he does well off hydrochlorothiazide  first  -let me know how are you are doing on blood pressure in 2-4 weeks  Recommended follow up: Return in about 6 months (around 05/07/2025) for physical or sooner if needed.Schedule b4 you leave.

## 2024-11-06 NOTE — Assessment & Plan Note (Signed)
 Doing exceptionally well at this time.  No changes in management.  Wants to follow-up though again in 3 to 4 months

## 2024-11-06 NOTE — Patient Instructions (Signed)
 Make f/u in 3 months just in case Clarathiadone if BP goes back up

## 2024-11-06 NOTE — Assessment & Plan Note (Signed)
 Patient is coming off of the hydrochlorothiazide .  Possible gouty deposits in the large toe.  Discussed if blood pressure did go up chlorthalidone would be an alternative.  Patient will continue to discuss with primary care

## 2024-11-06 NOTE — Progress Notes (Signed)
 Phone [phone] In person visit   Subjective:   Alex [name] is a 63 y.o. year old very pleasant male patient who presents for/with See problem oriented charting Chief Complaint  Patient presents with   Medical Management of Chronic Issues    6 month follow up;    Diabetes    Fax sent to obtain diabetic eye exam from June   Hypertension   Joint Pain    Last time patient saw Dr. 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   Low back pain 11/04/2022    Priority: 1.   Plantar fasciitis, bilateral 11/04/2022    Priority: 1.   Piriformis syndrome of right side 08/19/2019    Priority: 1.   SI (sacroiliac) joint dysfunction 12/21/2023   Atypical chest pain 05/03/2023    Medications- reviewed and updated Current Outpatient Medications  Medication Sig Dispense Refill   aspirin EC 81 MG tablet Take 81 mg by mouth daily. Swallow whole.     EPINEPHrine  0.3 mg/0.3 mL IJ SOAJ injection Inject 0.3 mg into the muscle as needed for anaphylaxis. 2 each 1    ezetimibe  (ZETIA ) 10 MG tablet Take 1 tablet (10 mg total) by mouth daily. 90 tablet 2   fluticasone  (FLONASE ) 50 MCG/ACT nasal spray Place 1 spray into both nostrils daily. 48 g 3   hydrochlorothiazide  (HYDRODIURIL ) 12.5 MG tablet Take 1 tablet (12.5 mg total) by mouth daily. 90 tablet 3   icosapent  Ethyl (VASCEPA ) 1 g capsule Take 2 capsules (2 g total) by mouth 2 (two) times daily. 360 capsule 2   Lancet Devices (ACCU-CHEK SOFTCLIX) lancets 1 each by Other route daily. Use as instructed 1 each 11   lisinopril  (ZESTRIL ) 40 MG tablet Take 1 tablet by mouth once daily 90 tablet 0   lovastatin  (MEVACOR ) 10 MG tablet Take 1 tablet (10 mg total) by mouth once a week. 13 tablet 3   meloxicam  (MOBIC ) 7.5 MG tablet Take 1 tablet by mouth once daily 90 tablet 0   Multiple Vitamin (MULTIVITAMIN) tablet Take 1 tablet by mouth daily.     REPATHA  SURECLICK 140 MG/ML SOAJ INJECT 140 MG INTO THE SKIN EVERY 14 DAYS 6 mL 0   tiZANidine  (ZANAFLEX ) 4 MG tablet TAKE 1 TABLET BY MOUTH AT BEDTIME 90 tablet 0   vitamin B-12 (CYANOCOBALAMIN ) 500 MCG tablet Take 500 mcg by mouth daily.  clobetasol  cream (TEMOVATE ) 0.05 % Apply 1 Application topically 2 (two) times daily. Eczema for up to 7-10 days 60 g 2   glucose blood (ACCU-CHEK AVIVA) test strip Check blood sugar daily as needed. 100 each 3   No current facility-administered medications for this visit.     Objective:  BP 118/62 (BP Location: Left Arm, Patient Position: Sitting, Cuff Size: Normal)   Pulse 74   Temp 97.7 F (36.5 C) (Temporal)   Ht 5' 6 (1.676 m)   Wt 165 lb 12.8 oz (75.2 kg)   SpO2 95%   BMI 26.76 kg/m  Gen: NAD, resting comfortably CV: RRR no murmurs rubs or gallops Lungs: CTAB no crackles, wheeze, rhonchi Ext: no edema Skin: warm, dry     Assessment and Plan   #Diabetes mellitus-with hypertension and hyperlipidemia S: Medication: none- Patient has been diet and exercise controlled. A/P: hopefully stable- update a1c today.  Continue without meds for now    #CAD nonobstructive- sees Dr. Jeffrie  #Hyperlipidemia associated with diabetes #aortic atherosclerosis #statin myopathy S: medication: tolerating lovastatin  10 mg once a week and Repatha  140 mg every 2 weeks as well as Vascepa  2 g twice daily, aspirin 81 mg , Zetia  10 mg  -Patient has been intolerant to statin due to myalgias in the past even atorvastatin  40 mg once a week.   Lab Results  Component Value Date   CHOL 122 02/15/2024   HDL 39 (L) 02/15/2024   LDLCALC 57 02/15/2024   LDLDIRECT 154.0 05/03/2023   TRIG 154 (H) 02/15/2024   CHOLHDL 3.1 02/15/2024  A/P: lipids have been at goal even for CAD- update with labs today    #Hypertension S: Compliant with lisinopril  40 mg -hydrochlorothiazide  12.5 mg added 2024 but uric acid high . Doing some tart cherry juice as well This morning 107/71 BP Readings from Last 3 Encounters:  11/06/24 118/62  07/31/24 118/82  05/07/24 120/70  A/P: blood pressure very well controlled in office and even better control at home this morning- we opted with high uric acid to trial off the hydrochlorothiazide  completely but continue lisinopril  40 mg . Considering losartan  instead of lisinopril  as well but want to make sure he does well off hydrochlorothiazide  first   # BPH with nocturia- tolerates this- did not tolerate tamsulosin   #Chronic low back pain- works with Dr. Claudene of sports medicine - muscle relaxants as needed  - seeing chiropractor in [place] (dry needling really helped since last visit) plus Dr. Claudene   Recommended follow up: Return in about 6 months (around 05/07/2025) for physical or sooner if needed.Schedule b4 you leave. Future Appointments  Date Time Provider Department Center  11/06/2024 11:45 AM Claudene Arthea HERO, DO [facility] None  12/25/2024 11:30 AM Alm Delon SAILOR, DO [facility] None    Lab/Order associations: fasting   ICD-10-CM   1. Type 2 diabetes mellitus with unspecified complications (HCC)   E11.8 CBC with Differential/Platelet    Comprehensive metabolic panel with GFR    Hemoglobin A1c    2. Hyperlipidemia associated with type 2 diabetes mellitus (HCC)  E11.69 CBC with Differential/Platelet   E78.5 Comprehensive metabolic panel with GFR    3. Immunization due  Z23 Flu vaccine HIGH DOSE PF(Fluzone Trivalent)    4. Drug-induced myopathy  G72.0     5. Coronary artery disease involving native coronary artery of native heart without angina pectoris  I25.10     6. Essential hypertension  I10       Meds ordered  this encounter  Medications   glucose blood (ACCU-CHEK AVIVA) test strip    Sig: Check blood sugar daily as needed.    Dispense:  100 each    Refill:  3   clobetasol  cream (TEMOVATE ) 0.05 %    Sig: Apply 1 Application topically 2 (two) times daily. Eczema for up to 7-10 days    Dispense:  60 g    Refill:  2    Return precautions advised.  Garnette Lukes, MD

## 2024-11-22 ENCOUNTER — Other Ambulatory Visit: Payer: Self-pay | Admitting: Cardiology

## 2024-11-22 DIAGNOSIS — E1169 Type 2 diabetes mellitus with other specified complication: Secondary | ICD-10-CM

## 2024-11-22 DIAGNOSIS — R072 Precordial pain: Secondary | ICD-10-CM

## 2024-11-26 ENCOUNTER — Other Ambulatory Visit: Payer: Self-pay

## 2024-11-27 MED ORDER — ICOSAPENT ETHYL 1 G PO CAPS: 2.0000 g | ORAL_CAPSULE | Freq: Two times a day (BID) | ORAL | 0 refills | Status: DC

## 2024-11-27 MED ORDER — EZETIMIBE 10 MG PO TABS: 10.0000 mg | ORAL_TABLET | Freq: Every day | ORAL | 0 refills | Status: DC

## 2024-12-03 ENCOUNTER — Other Ambulatory Visit: Payer: Self-pay | Admitting: Family Medicine

## 2024-12-24 ENCOUNTER — Other Ambulatory Visit: Payer: Self-pay | Admitting: Cardiology

## 2024-12-25 ENCOUNTER — Ambulatory Visit: Admitting: Dermatology

## 2024-12-25 ENCOUNTER — Encounter: Payer: Self-pay | Admitting: Dermatology

## 2024-12-25 VITALS — BP 173/88

## 2024-12-25 DIAGNOSIS — D229 Melanocytic nevi, unspecified: Secondary | ICD-10-CM

## 2024-12-25 DIAGNOSIS — Z1283 Encounter for screening for malignant neoplasm of skin: Secondary | ICD-10-CM

## 2024-12-25 DIAGNOSIS — L570 Actinic keratosis: Secondary | ICD-10-CM

## 2024-12-25 DIAGNOSIS — L309 Dermatitis, unspecified: Secondary | ICD-10-CM

## 2024-12-25 DIAGNOSIS — W908XXA Exposure to other nonionizing radiation, initial encounter: Secondary | ICD-10-CM | POA: Diagnosis not present

## 2024-12-25 DIAGNOSIS — L578 Other skin changes due to chronic exposure to nonionizing radiation: Secondary | ICD-10-CM

## 2024-12-25 DIAGNOSIS — D225 Melanocytic nevi of trunk: Secondary | ICD-10-CM

## 2024-12-25 DIAGNOSIS — L821 Other seborrheic keratosis: Secondary | ICD-10-CM

## 2024-12-25 DIAGNOSIS — D1801 Hemangioma of skin and subcutaneous tissue: Secondary | ICD-10-CM

## 2024-12-25 DIAGNOSIS — D485 Neoplasm of uncertain behavior of skin: Secondary | ICD-10-CM | POA: Diagnosis not present

## 2024-12-25 DIAGNOSIS — L814 Other melanin hyperpigmentation: Secondary | ICD-10-CM | POA: Diagnosis not present

## 2024-12-25 NOTE — Patient Instructions (Addendum)

## 2024-12-26 LAB — SURGICAL PATHOLOGY

## 2024-12-31 ENCOUNTER — Ambulatory Visit: Payer: Self-pay | Admitting: Dermatology

## 2025-02-03 ENCOUNTER — Ambulatory Visit: Admitting: Family Medicine

## 2025-05-14 ENCOUNTER — Encounter: Admitting: Family Medicine

## 2025-05-29 ENCOUNTER — Encounter: Admitting: Family Medicine
